# Patient Record
Sex: Female | Born: 1990 | Race: White | Hispanic: No | Marital: Married | Smoking: Never smoker
Health system: Southern US, Community
[De-identification: ages and names within clinical notes are randomized; demographics above are authoritative.]

## PROBLEM LIST (undated history)

## (undated) ENCOUNTER — Inpatient Hospital Stay: Payer: Self-pay

## (undated) DIAGNOSIS — O139 Gestational [pregnancy-induced] hypertension without significant proteinuria, unspecified trimester: Secondary | ICD-10-CM

## (undated) DIAGNOSIS — N200 Calculus of kidney: Secondary | ICD-10-CM

## (undated) DIAGNOSIS — K449 Diaphragmatic hernia without obstruction or gangrene: Secondary | ICD-10-CM

## (undated) DIAGNOSIS — T4145XA Adverse effect of unspecified anesthetic, initial encounter: Secondary | ICD-10-CM

## (undated) DIAGNOSIS — T8859XA Other complications of anesthesia, initial encounter: Secondary | ICD-10-CM

## (undated) DIAGNOSIS — R12 Heartburn: Secondary | ICD-10-CM

## (undated) DIAGNOSIS — R519 Headache, unspecified: Secondary | ICD-10-CM

## (undated) DIAGNOSIS — I10 Essential (primary) hypertension: Secondary | ICD-10-CM

## (undated) DIAGNOSIS — Z8489 Family history of other specified conditions: Secondary | ICD-10-CM

## (undated) DIAGNOSIS — F329 Major depressive disorder, single episode, unspecified: Secondary | ICD-10-CM

## (undated) DIAGNOSIS — R011 Cardiac murmur, unspecified: Secondary | ICD-10-CM

## (undated) DIAGNOSIS — O24419 Gestational diabetes mellitus in pregnancy, unspecified control: Secondary | ICD-10-CM

## (undated) DIAGNOSIS — R51 Headache: Secondary | ICD-10-CM

## (undated) DIAGNOSIS — F32A Depression, unspecified: Secondary | ICD-10-CM

## (undated) DIAGNOSIS — E041 Nontoxic single thyroid nodule: Secondary | ICD-10-CM

## (undated) DIAGNOSIS — K219 Gastro-esophageal reflux disease without esophagitis: Secondary | ICD-10-CM

## (undated) DIAGNOSIS — T753XXA Motion sickness, initial encounter: Secondary | ICD-10-CM

## (undated) DIAGNOSIS — Z87442 Personal history of urinary calculi: Secondary | ICD-10-CM

## (undated) HISTORY — DX: Essential (primary) hypertension: I10

## (undated) HISTORY — DX: Depression, unspecified: F32.A

## (undated) HISTORY — DX: Headache, unspecified: R51.9

## (undated) HISTORY — DX: Diaphragmatic hernia without obstruction or gangrene: K44.9

## (undated) HISTORY — DX: Heartburn: R12

## (undated) HISTORY — DX: Gestational diabetes mellitus in pregnancy, unspecified control: O24.419

## (undated) HISTORY — DX: Headache: R51

## (undated) HISTORY — DX: Major depressive disorder, single episode, unspecified: F32.9

## (undated) HISTORY — PX: WISDOM TOOTH EXTRACTION: SHX21

---
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
## 2010-07-15 ENCOUNTER — Ambulatory Visit: Payer: Self-pay | Admitting: Family Medicine

## 2010-11-11 NOTE — Assessment & Plan Note (Signed)
Summary: FLU SHOT/EVM    The patient and/or caregiver has been counseled thoroughly with regard to medications prescribed including dosage, schedule, interactions, rationale for use, and possible side effects and they verbalize understanding.  Diagnoses and expected course of recovery discussed and will return if not improved as expected or if the condition worsens. Patient and/or caregiver verbalized understanding.    Immunizations Administered:  Influenza Vaccine:    Vaccine Type: FLULAVAL    Site: left deltoid    Mfr: GlaxoSmithKline    Dose: 0.5 ml    Route: IM    Given by: Levonne Spiller EMT-P    Exp. Date: 03/12/2011    Lot #: [id number]    VIS given: 05/06/10 version given July 15, 2010.  Flu Vaccine Consent Questions:    Do you have a history of severe allergic reactions to this vaccine? no    Any prior history of allergic reactions to egg and/or gelatin? no    Do you have a sensitivity to the preservative Thimersol? no    Do you have a past history of Guillan-Barre Syndrome? no    Do you currently have an acute febrile illness? no    Have you ever had a severe reaction to latex? no    Vaccine information given and explained to patient? yes    Are you currently pregnant? no

## 2014-10-12 NOTE — L&D Delivery Note (Cosign Needed)
Delivery Note At  a viable female Sonya Bauer)  was delivered via  (Presentation: OA ;  ).  APGAR 8,9 ; weight  2910 gms/6lbs 8 ounces Placenta status: Intact delivered via shultz Cord: 3 vessls  with the following complications: none.    Anesthesia:  epidural Episiotomy:  none Lacerations:  superficial  Suture Repair: n/a Est. Blood Loss (mL):  350  Mom to postpartum.  Baby to Couplet care / Skin to Skin   Delivered with [name], CNM [name], SNM  [name] 07/02/2015, 5:50 AM

## 2014-11-16 DIAGNOSIS — E042 Nontoxic multinodular goiter: Secondary | ICD-10-CM | POA: Insufficient documentation

## 2014-11-16 DIAGNOSIS — E041 Nontoxic single thyroid nodule: Secondary | ICD-10-CM

## 2014-11-16 HISTORY — DX: Nontoxic single thyroid nodule: E04.1

## 2014-12-18 LAB — OB RESULTS CONSOLE HIV ANTIBODY (ROUTINE TESTING): HIV: NONREACTIVE

## 2014-12-18 LAB — OB RESULTS CONSOLE VARICELLA ZOSTER ANTIBODY, IGG: VARICELLA IGG: IMMUNE

## 2014-12-18 LAB — OB RESULTS CONSOLE RUBELLA ANTIBODY, IGM: Rubella: IMMUNE

## 2014-12-18 LAB — OB RESULTS CONSOLE HEPATITIS B SURFACE ANTIGEN: HEP B S AG: NEGATIVE

## 2014-12-31 DIAGNOSIS — Z8619 Personal history of other infectious and parasitic diseases: Secondary | ICD-10-CM | POA: Insufficient documentation

## 2014-12-31 LAB — OB RESULTS CONSOLE GC/CHLAMYDIA
Chlamydia: NEGATIVE
Gonorrhea: NEGATIVE

## 2015-01-25 DIAGNOSIS — J302 Other seasonal allergic rhinitis: Secondary | ICD-10-CM | POA: Insufficient documentation

## 2015-01-25 DIAGNOSIS — E041 Nontoxic single thyroid nodule: Secondary | ICD-10-CM | POA: Insufficient documentation

## 2015-01-25 DIAGNOSIS — G44219 Episodic tension-type headache, not intractable: Secondary | ICD-10-CM | POA: Insufficient documentation

## 2015-02-24 DIAGNOSIS — I152 Hypertension secondary to endocrine disorders: Secondary | ICD-10-CM | POA: Insufficient documentation

## 2015-03-20 ENCOUNTER — Encounter: Payer: Self-pay | Admitting: Obstetrics and Gynecology

## 2015-03-20 ENCOUNTER — Ambulatory Visit (INDEPENDENT_AMBULATORY_CARE_PROVIDER_SITE_OTHER): Payer: Medicaid Other | Admitting: Obstetrics and Gynecology

## 2015-03-20 VITALS — BP 114/92 | HR 88 | Ht 65.0 in | Wt 152.0 lb

## 2015-03-20 DIAGNOSIS — B009 Herpesviral infection, unspecified: Secondary | ICD-10-CM

## 2015-03-20 DIAGNOSIS — O26892 Other specified pregnancy related conditions, second trimester: Secondary | ICD-10-CM

## 2015-03-20 DIAGNOSIS — O162 Unspecified maternal hypertension, second trimester: Secondary | ICD-10-CM

## 2015-03-20 DIAGNOSIS — O0992 Supervision of high risk pregnancy, unspecified, second trimester: Secondary | ICD-10-CM

## 2015-03-20 DIAGNOSIS — Z3492 Encounter for supervision of normal pregnancy, unspecified, second trimester: Secondary | ICD-10-CM

## 2015-03-20 DIAGNOSIS — R12 Heartburn: Secondary | ICD-10-CM

## 2015-03-20 LAB — POCT URINALYSIS DIPSTICK
Bilirubin, UA: NEGATIVE
Blood, UA: NEGATIVE
GLUCOSE UA: NEGATIVE
KETONES UA: NEGATIVE
Leukocytes, UA: NEGATIVE
Nitrite, UA: NEGATIVE
PH UA: 6.5
PROTEIN UA: NEGATIVE
SPEC GRAV UA: 1.01
UROBILINOGEN UA: 0.2

## 2015-03-20 NOTE — Progress Notes (Signed)
Patient is transferring care, new to the area, has history of hypertension prior to pregnancy

## 2015-03-20 NOTE — Progress Notes (Signed)
Transfer OB- 24yo engaged female G1P0, moving home from [place], happy about current unplanned pregnancy; reports normal prenatal findings to date with stable BP (pre-existing HTN), normal female noted on anatomy scan- awaiting records. Instructed on enrolling in CBC,Infant care and Breastfeeding classes. [place] and HTN labs next visit.

## 2015-03-20 NOTE — Patient Instructions (Signed)
  Place 24-38 weeks prenatal visit patient instructions here.

## 2015-04-11 ENCOUNTER — Encounter: Payer: Self-pay | Admitting: *Deleted

## 2015-04-11 ENCOUNTER — Other Ambulatory Visit: Payer: Self-pay | Admitting: Obstetrics and Gynecology

## 2015-04-11 ENCOUNTER — Telehealth: Payer: Self-pay | Admitting: Obstetrics and Gynecology

## 2015-04-11 DIAGNOSIS — O26892 Other specified pregnancy related conditions, second trimester: Secondary | ICD-10-CM

## 2015-04-11 DIAGNOSIS — R12 Heartburn: Principal | ICD-10-CM

## 2015-04-11 MED ORDER — RANITIDINE HCL 150 MG PO TABS: 150.0000 mg | ORAL_TABLET | Freq: Two times a day (BID) | ORAL | Status: DC

## 2015-04-11 NOTE — Telephone Encounter (Signed)
Please let her know I sent in a prescritpion for zantac to ake 2 x a day, start tonight about 30 minutes before dinner, then one every am after taking PNV, needs to take 2 x day to prevent.

## 2015-04-11 NOTE — Telephone Encounter (Signed)
Pt has severe heart burn and wanted to know what she can take,, she is pregnant [redacted] weeks

## 2015-04-11 NOTE — Telephone Encounter (Signed)
Notified pt rx ready 

## 2015-04-11 NOTE — Telephone Encounter (Signed)
Would like something sent in, pls advise

## 2015-04-17 ENCOUNTER — Ambulatory Visit (INDEPENDENT_AMBULATORY_CARE_PROVIDER_SITE_OTHER): Payer: Medicaid Other | Admitting: Obstetrics and Gynecology

## 2015-04-17 ENCOUNTER — Encounter: Payer: Self-pay | Admitting: Obstetrics and Gynecology

## 2015-04-17 ENCOUNTER — Other Ambulatory Visit: Payer: Medicaid Other

## 2015-04-17 VITALS — BP 124/94 | HR 92 | Wt 157.2 lb

## 2015-04-17 DIAGNOSIS — Z3493 Encounter for supervision of normal pregnancy, unspecified, third trimester: Secondary | ICD-10-CM | POA: Diagnosis not present

## 2015-04-17 DIAGNOSIS — Z131 Encounter for screening for diabetes mellitus: Secondary | ICD-10-CM

## 2015-04-17 DIAGNOSIS — O10912 Unspecified pre-existing hypertension complicating pregnancy, second trimester: Secondary | ICD-10-CM

## 2015-04-17 DIAGNOSIS — I10 Essential (primary) hypertension: Secondary | ICD-10-CM

## 2015-04-17 LAB — POCT URINALYSIS DIPSTICK
Bilirubin, UA: NEGATIVE
Blood, UA: NEGATIVE
GLUCOSE UA: NEGATIVE
Ketones, UA: NEGATIVE
Leukocytes, UA: NEGATIVE
Nitrite, UA: NEGATIVE
PH UA: 7
SPEC GRAV UA: 1.01
Urobilinogen, UA: 0.2

## 2015-04-17 NOTE — Progress Notes (Signed)
ROB & Glucola- doing well w/o complaint- labs obtained; hasn't enrolled in classes yet; female infant; reviewed PIH s./s and reviewed chart with Dr Marcelline Mates. Blood consent signed & Tdap given

## 2015-04-17 NOTE — Progress Notes (Signed)
Pt denies any complaints, no headache, blurred vision

## 2015-04-18 ENCOUNTER — Other Ambulatory Visit: Payer: Self-pay | Admitting: Obstetrics and Gynecology

## 2015-04-18 DIAGNOSIS — R7309 Other abnormal glucose: Secondary | ICD-10-CM

## 2015-04-18 LAB — CBC WITH DIFFERENTIAL/PLATELET
Basophils Absolute: 0 10*3/uL (ref 0.0–0.2)
Basos: 0 %
EOS (ABSOLUTE): 0.1 10*3/uL (ref 0.0–0.4)
Eos: 2 %
Hematocrit: 34.5 % (ref 34.0–46.6)
Hemoglobin: 11.6 g/dL (ref 11.1–15.9)
Immature Grans (Abs): 0.1 10*3/uL (ref 0.0–0.1)
Immature Granulocytes: 1 %
Lymphocytes Absolute: 0.8 10*3/uL (ref 0.7–3.1)
Lymphs: 14 %
MCH: 28.7 pg (ref 26.6–33.0)
MCHC: 33.6 g/dL (ref 31.5–35.7)
MCV: 85 fL (ref 79–97)
Monocytes Absolute: 0.3 10*3/uL (ref 0.1–0.9)
Monocytes: 5 %
NEUTROS ABS: 4.7 10*3/uL (ref 1.4–7.0)
Neutrophils: 78 %
Platelets: 216 10*3/uL (ref 150–379)
RBC: 4.04 x10E6/uL (ref 3.77–5.28)
RDW: 13.2 % (ref 12.3–15.4)
WBC: 6 10*3/uL (ref 3.4–10.8)

## 2015-04-18 LAB — BASIC METABOLIC PANEL
BUN/Creatinine Ratio: 17 (ref 8–20)
BUN: 8 mg/dL (ref 6–20)
CALCIUM: 8.8 mg/dL (ref 8.7–10.2)
CO2: 18 mmol/L (ref 18–29)
Chloride: 99 mmol/L (ref 97–108)
Creatinine, Ser: 0.48 mg/dL — ABNORMAL LOW (ref 0.57–1.00)
GFR calc Af Amer: 160 mL/min/{1.73_m2} (ref >59)
GFR calc non Af Amer: 139 mL/min/{1.73_m2} (ref >59)
GLUCOSE: 162 mg/dL — AB (ref 65–99)
POTASSIUM: 3.7 mmol/L (ref 3.5–5.2)
SODIUM: 135 mmol/L (ref 134–144)

## 2015-04-18 LAB — PROTEIN / CREATININE RATIO, URINE
Creatinine, Urine: 53.7 mg/dL
PROTEIN/CREAT RATIO: 348 mg/g{creat} — AB (ref 0–200)
Protein, Ur: 18.7 mg/dL

## 2015-04-18 LAB — URIC ACID: Uric Acid: 3.6 mg/dL (ref 2.5–7.1)

## 2015-04-18 LAB — TSH: TSH: 1.18 u[IU]/mL (ref 0.450–4.500)

## 2015-04-18 LAB — GLUCOSE, 1 HOUR GESTATIONAL: Gestational Diabetes Screen: 164 mg/dL — ABNORMAL HIGH (ref 65–139)

## 2015-04-22 ENCOUNTER — Telehealth: Payer: Self-pay | Admitting: *Deleted

## 2015-04-22 NOTE — Telephone Encounter (Signed)
Notified pt of abnormal lab results, advised pt to be mindful of her carbs and sweet, pt will be in on 7/156/2016 for 3 hr gTT

## 2015-04-22 NOTE — Telephone Encounter (Signed)
-----   Message from Sonya Bauer, [place] sent at 04/18/2015  9:21 AM EDT ----- Please let her know she failed her glucola and needs to come in within a week for the 3 hr GTT, all other labs normal

## 2015-04-26 ENCOUNTER — Other Ambulatory Visit: Payer: Medicaid Other

## 2015-04-26 ENCOUNTER — Other Ambulatory Visit: Payer: Self-pay | Admitting: Obstetrics and Gynecology

## 2015-04-26 DIAGNOSIS — R7309 Other abnormal glucose: Secondary | ICD-10-CM

## 2015-04-30 ENCOUNTER — Telehealth: Payer: Self-pay | Admitting: *Deleted

## 2015-04-30 LAB — GESTATIONAL GLUCOSE TOLERANCE
GLUCOSE 2 HOUR GTT: 116 mg/dL (ref 65–154)
Glucose, Fasting: 79 mg/dL (ref 65–94)
Glucose, GTT - 1 Hour: 192 mg/dL — ABNORMAL HIGH (ref 65–179)
Glucose, GTT - 3 Hour: 89 mg/dL (ref 65–139)

## 2015-04-30 LAB — SPECIMEN STATUS REPORT

## 2015-04-30 NOTE — Telephone Encounter (Signed)
Notified pt of lab results and she voiced understanding

## 2015-04-30 NOTE — Telephone Encounter (Signed)
-----   Message from Evonnie Pat, [place] sent at 04/30/2015  1:43 PM EDT ----- Please let her know she passed 3hGTT, but 1 hr is really high- needs to limit carbs and simple sugar intake as her blood levels take longer to come down to normal. Baby gets all that extra sugar and can be bigger at birth.

## 2015-05-07 ENCOUNTER — Encounter: Payer: Medicaid Other | Admitting: Obstetrics and Gynecology

## 2015-05-08 ENCOUNTER — Encounter: Payer: Self-pay | Admitting: Obstetrics and Gynecology

## 2015-05-08 ENCOUNTER — Ambulatory Visit (INDEPENDENT_AMBULATORY_CARE_PROVIDER_SITE_OTHER): Payer: Medicaid Other | Admitting: Obstetrics and Gynecology

## 2015-05-08 VITALS — BP 111/75 | HR 86 | Wt 162.2 lb

## 2015-05-08 DIAGNOSIS — Z3493 Encounter for supervision of normal pregnancy, unspecified, third trimester: Secondary | ICD-10-CM

## 2015-05-08 DIAGNOSIS — A6000 Herpesviral infection of urogenital system, unspecified: Secondary | ICD-10-CM

## 2015-05-08 DIAGNOSIS — R030 Elevated blood-pressure reading, without diagnosis of hypertension: Secondary | ICD-10-CM

## 2015-05-08 DIAGNOSIS — IMO0001 Reserved for inherently not codable concepts without codable children: Secondary | ICD-10-CM

## 2015-05-08 LAB — POCT URINALYSIS DIPSTICK
BILIRUBIN UA: NEGATIVE
Blood, UA: NEGATIVE
GLUCOSE UA: NEGATIVE
Ketones, UA: NEGATIVE
LEUKOCYTES UA: NEGATIVE
NITRITE UA: NEGATIVE
PH UA: 7
Spec Grav, UA: 1.01
Urobilinogen, UA: 0.2

## 2015-05-08 NOTE — Progress Notes (Signed)
ROB- to add colace daily, and increase fiber in diet, increase water intake.  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

## 2015-05-08 NOTE — Progress Notes (Signed)
Pt is here c/p SOB, constipation

## 2015-05-10 ENCOUNTER — Encounter: Payer: Medicaid Other | Admitting: Obstetrics and Gynecology

## 2015-05-23 ENCOUNTER — Encounter: Payer: Medicaid Other | Admitting: Obstetrics and Gynecology

## 2015-05-24 ENCOUNTER — Ambulatory Visit (INDEPENDENT_AMBULATORY_CARE_PROVIDER_SITE_OTHER): Payer: Medicaid Other | Admitting: Obstetrics and Gynecology

## 2015-05-24 ENCOUNTER — Encounter: Payer: Self-pay | Admitting: Obstetrics and Gynecology

## 2015-05-24 ENCOUNTER — Encounter: Payer: Medicaid Other | Admitting: Obstetrics and Gynecology

## 2015-05-24 VITALS — BP 122/86 | HR 88 | Wt 163.4 lb

## 2015-05-24 DIAGNOSIS — Z3493 Encounter for supervision of normal pregnancy, unspecified, third trimester: Secondary | ICD-10-CM

## 2015-05-24 LAB — POCT URINALYSIS DIPSTICK
BILIRUBIN UA: NEGATIVE
GLUCOSE UA: NEGATIVE
KETONES UA: NEGATIVE
LEUKOCYTES UA: NEGATIVE
NITRITE UA: NEGATIVE
RBC UA: NEGATIVE
Spec Grav, UA: 1.01
Urobilinogen, UA: 0.2
pH, UA: 7

## 2015-05-24 MED ORDER — PANTOPRAZOLE SODIUM 40 MG PO TBEC: 40.0000 mg | DELAYED_RELEASE_TABLET | Freq: Every day | ORAL | Status: DC

## 2015-05-24 NOTE — Progress Notes (Signed)
ROB-heartburn has gotten worse, pt is having some morning sickness

## 2015-05-24 NOTE — Progress Notes (Signed)
ROB- changed to protonix rx sent in, interested in Cord blood donation- info given

## 2015-06-07 ENCOUNTER — Ambulatory Visit (INDEPENDENT_AMBULATORY_CARE_PROVIDER_SITE_OTHER): Payer: Medicaid Other | Admitting: Obstetrics and Gynecology

## 2015-06-07 ENCOUNTER — Encounter: Payer: Self-pay | Admitting: Obstetrics and Gynecology

## 2015-06-07 VITALS — BP 124/83 | HR 87 | Wt 171.0 lb

## 2015-06-07 DIAGNOSIS — Z331 Pregnant state, incidental: Secondary | ICD-10-CM

## 2015-06-07 LAB — POCT URINALYSIS DIPSTICK
Bilirubin, UA: NEGATIVE
Blood, UA: NEGATIVE
GLUCOSE UA: NEGATIVE
Ketones, UA: NEGATIVE
Leukocytes, UA: NEGATIVE
Nitrite, UA: NEGATIVE
SPEC GRAV UA: 1.015
Urobilinogen, UA: 0.2
pH, UA: 6.5

## 2015-06-07 NOTE — Progress Notes (Signed)
ROB- doing well, to increase water and watch sodium intake

## 2015-06-07 NOTE — Patient Instructions (Signed)

## 2015-06-07 NOTE — Progress Notes (Signed)
ROB-pt is having some increased swelling, concerned about her weight gain

## 2015-06-12 ENCOUNTER — Observation Stay
Admission: EM | Admit: 2015-06-12 | Discharge: 2015-06-13 | Disposition: A | Discharge status: Home / Self Care | Payer: Medicaid Other | Attending: Obstetrics and Gynecology | Admitting: Obstetrics and Gynecology

## 2015-06-12 DIAGNOSIS — Z3A36 36 weeks gestation of pregnancy: Secondary | ICD-10-CM | POA: Insufficient documentation

## 2015-06-12 DIAGNOSIS — O26893 Other specified pregnancy related conditions, third trimester: Principal | ICD-10-CM | POA: Insufficient documentation

## 2015-06-12 DIAGNOSIS — R102 Pelvic and perineal pain: Secondary | ICD-10-CM | POA: Insufficient documentation

## 2015-06-12 DIAGNOSIS — A6000 Herpesviral infection of urogenital system, unspecified: Secondary | ICD-10-CM | POA: Insufficient documentation

## 2015-06-12 DIAGNOSIS — O0993 Supervision of high risk pregnancy, unspecified, third trimester: Secondary | ICD-10-CM

## 2015-06-12 DIAGNOSIS — R1030 Lower abdominal pain, unspecified: Secondary | ICD-10-CM | POA: Insufficient documentation

## 2015-06-12 DIAGNOSIS — O98313 Other infections with a predominantly sexual mode of transmission complicating pregnancy, third trimester: Secondary | ICD-10-CM | POA: Insufficient documentation

## 2015-06-13 DIAGNOSIS — O0993 Supervision of high risk pregnancy, unspecified, third trimester: Secondary | ICD-10-CM

## 2015-06-13 DIAGNOSIS — R102 Pelvic and perineal pain: Secondary | ICD-10-CM | POA: Diagnosis not present

## 2015-06-13 DIAGNOSIS — R1030 Lower abdominal pain, unspecified: Secondary | ICD-10-CM | POA: Diagnosis not present

## 2015-06-13 DIAGNOSIS — O98313 Other infections with a predominantly sexual mode of transmission complicating pregnancy, third trimester: Secondary | ICD-10-CM | POA: Diagnosis not present

## 2015-06-13 DIAGNOSIS — A6000 Herpesviral infection of urogenital system, unspecified: Secondary | ICD-10-CM | POA: Diagnosis not present

## 2015-06-13 DIAGNOSIS — O26893 Other specified pregnancy related conditions, third trimester: Secondary | ICD-10-CM | POA: Diagnosis present

## 2015-06-13 DIAGNOSIS — O133 Gestational [pregnancy-induced] hypertension without significant proteinuria, third trimester: Secondary | ICD-10-CM

## 2015-06-13 DIAGNOSIS — Z3A36 36 weeks gestation of pregnancy: Secondary | ICD-10-CM | POA: Diagnosis not present

## 2015-06-13 LAB — URINALYSIS COMPLETE WITH MICROSCOPIC (ARMC ONLY)
Bilirubin Urine: NEGATIVE
Glucose, UA: NEGATIVE mg/dL
Hgb urine dipstick: NEGATIVE
Ketones, ur: NEGATIVE mg/dL
Leukocytes, UA: NEGATIVE
Nitrite: NEGATIVE
PH: 6 (ref 5.0–8.0)
Protein, ur: NEGATIVE mg/dL
Specific Gravity, Urine: 1.018 (ref 1.005–1.030)

## 2015-06-13 NOTE — OB Triage Note (Signed)
C/o lower abdominal pain. Points to round ligament.   Denies ROM, VB.   +FM   Started around `9741.   Has not taken anything for this pain.  Last BM 06/12/2015 2220  (X3)

## 2015-06-13 NOTE — Progress Notes (Signed)
Report called to M. Trudee Kuster, CNM. Order received.

## 2015-06-13 NOTE — Progress Notes (Signed)
Pt given d/c instructions, verbalized understanding.  Will keep next appt.   D/C ambulatory in stable condition home with husband.

## 2015-06-16 NOTE — Discharge Summary (Signed)
NST INTERPRETATION:  Indications: Abdominal pain  Mode: External Baseline Rate (A): 140 bpm Variability: Moderate Accelerations: 15 x 15 Decelerations: None     Contraction Frequency (min): 0   Impression:  1.reactive NST at [redacted] weeks gestation 2.  No contractions   Plan:  1.  Urine culture. 2.  Keep regular OB appointment as scheduled. 3.  Labor precautions    Brayton Mars, MD

## 2015-06-20 ENCOUNTER — Encounter: Payer: Medicaid Other | Admitting: Obstetrics and Gynecology

## 2015-06-21 ENCOUNTER — Encounter: Payer: Self-pay | Admitting: Obstetrics and Gynecology

## 2015-06-21 ENCOUNTER — Ambulatory Visit (INDEPENDENT_AMBULATORY_CARE_PROVIDER_SITE_OTHER): Payer: Medicaid Other | Admitting: Obstetrics and Gynecology

## 2015-06-21 VITALS — BP 138/98 | HR 88 | Wt 173.1 lb

## 2015-06-21 DIAGNOSIS — Z3685 Encounter for antenatal screening for Streptococcus B: Secondary | ICD-10-CM

## 2015-06-21 DIAGNOSIS — Z113 Encounter for screening for infections with a predominantly sexual mode of transmission: Secondary | ICD-10-CM

## 2015-06-21 DIAGNOSIS — Z3493 Encounter for supervision of normal pregnancy, unspecified, third trimester: Secondary | ICD-10-CM

## 2015-06-21 DIAGNOSIS — O133 Gestational [pregnancy-induced] hypertension without significant proteinuria, third trimester: Secondary | ICD-10-CM

## 2015-06-21 DIAGNOSIS — O163 Unspecified maternal hypertension, third trimester: Secondary | ICD-10-CM

## 2015-06-21 DIAGNOSIS — Z36 Encounter for antenatal screening of mother: Secondary | ICD-10-CM

## 2015-06-21 LAB — POCT URINALYSIS DIPSTICK
Blood, UA: NEGATIVE
Glucose, UA: NEGATIVE
KETONES UA: NEGATIVE
Leukocytes, UA: NEGATIVE
Nitrite, UA: NEGATIVE
PH UA: 6
SPEC GRAV UA: 1.02
Urobilinogen, UA: 0.2

## 2015-06-21 MED ORDER — INFLUENZA VAC SPLIT QUAD 0.5 ML IM SUSY
0.5000 mL | PREFILLED_SYRINGE | Freq: Once | INTRAMUSCULAR | Status: AC
  Administered 2015-06-21: 0.5 mL via INTRAMUSCULAR

## 2015-06-21 NOTE — Patient Instructions (Signed)

## 2015-06-21 NOTE — Progress Notes (Signed)
ROB & cultures- discussed s/s of PIH and placed out of work until labs return

## 2015-06-21 NOTE — Progress Notes (Signed)
ROB-denies any new complaints, cultures obtained today, flu given  Some increased swelling

## 2015-06-22 LAB — BASIC METABOLIC PANEL (7)
BUN / CREAT RATIO: 19 (ref 8–20)
BUN: 9 mg/dL (ref 6–20)
CHLORIDE: 101 mmol/L (ref 97–108)
CO2: 16 mmol/L — AB (ref 18–29)
CREATININE: 0.47 mg/dL — AB (ref 0.57–1.00)
GFR calc non Af Amer: 140 mL/min/{1.73_m2} (ref >59)
GFR, EST AFRICAN AMERICAN: 161 mL/min/{1.73_m2} (ref >59)
Glucose: 86 mg/dL (ref 65–99)
Potassium: 4.2 mmol/L (ref 3.5–5.2)
Sodium: 138 mmol/L (ref 134–144)

## 2015-06-22 LAB — PROTEIN / CREATININE RATIO, URINE
CREATININE, UR: 125.2 mg/dL
PROTEIN UR: 22.3 mg/dL
Protein/Creat Ratio: 178 mg/g creat (ref 0–200)

## 2015-06-22 LAB — ALT: ALT: 11 IU/L (ref 0–32)

## 2015-06-22 LAB — PLATELET COUNT: PLATELETS: 220 10*3/uL (ref 150–379)

## 2015-06-22 LAB — AST: AST: 10 IU/L (ref 0–40)

## 2015-06-22 LAB — URIC ACID: URIC ACID: 4.1 mg/dL (ref 2.5–7.1)

## 2015-06-23 LAB — STREP GP B NAA: STREP GROUP B AG: NEGATIVE

## 2015-06-24 LAB — GC/CHLAMYDIA PROBE AMP
CHLAMYDIA, DNA PROBE: NEGATIVE
NEISSERIA GONORRHOEAE BY PCR: NEGATIVE

## 2015-06-25 ENCOUNTER — Ambulatory Visit (INDEPENDENT_AMBULATORY_CARE_PROVIDER_SITE_OTHER): Payer: Medicaid Other | Admitting: Obstetrics and Gynecology

## 2015-06-25 ENCOUNTER — Encounter: Payer: Self-pay | Admitting: Obstetrics and Gynecology

## 2015-06-25 VITALS — BP 134/91 | HR 89 | Wt 171.9 lb

## 2015-06-25 DIAGNOSIS — Z3493 Encounter for supervision of normal pregnancy, unspecified, third trimester: Secondary | ICD-10-CM

## 2015-06-25 LAB — POCT URINALYSIS DIPSTICK
Blood, UA: NEGATIVE
Ketones, UA: NEGATIVE
Leukocytes, UA: NEGATIVE
NITRITE UA: NEGATIVE
Spec Grav, UA: 1.01
UROBILINOGEN UA: 0.2
pH, UA: 6.5

## 2015-06-25 NOTE — Progress Notes (Signed)
Subjective:   Sonya Bauer is a 24 y.o. G1P0 [redacted]w[redacted]d being seen today for her obstetrical visit.  Patient reports fatigue, no bleeding, no contractions, no cramping, no leaking and elevated BP readings at home of 130s/80-90s, one reading with diastolic 109.  Denies contractions, vaginal bleeding or leaking of fluid.  Reports good fetal movement.  The following portions of the patient's history were reviewed and updated as appropriate: allergies, current medications, past family history, past medical history, past social history, past surgical history and problem list.   Objective:  BP 134/91 mmHg  Pulse 89  Wt 171 lb 14.4 oz (77.973 kg)  LMP 10/07/2014 (LMP Unknown)  FHT:    Uterine Size:    Fetal Movement:    Presentation:      Abdomen:  soft, gravid, appropriate for gestational age,non-tender         Results for orders placed or performed in visit on 06/25/15 (from the past 24 hour(s))  POCT urinalysis dipstick     Status: None   Collection Time: 06/25/15 10:33 AM  Result Value Ref Range   Color, UA yellow    Clarity, UA cloudy    Glucose, UA 2+    Bilirubin, UA small    Ketones, UA neg    Spec Grav, UA 1.010    Blood, UA neg    pH, UA 6.5    Protein, UA trace    Urobilinogen, UA 0.2    Nitrite, UA neg    Leukocytes, UA Negative Negative    Assessment and Plan:   Pregnancy:  G1P0 at [redacted]w[redacted]d  1. Prenatal care, third trimester NST performed today was reviewed and was found to be reactive. Baseline145 with Moderate variability; No decels noted.  Continue recommended antenatal testing and prenatal care. - POCT urinalysis dipstick   Follow up in 3 days for U/S for AFI & EFW , NST, BP evelauation  At home on modified bedrest until delivery of infant.  Melody Valene Bors, CNM

## 2015-06-25 NOTE — Patient Instructions (Signed)

## 2015-06-25 NOTE — Progress Notes (Signed)
ROB- some increased blood pressure, feels very "dazy", not sleeping well Low back pain

## 2015-06-26 ENCOUNTER — Encounter: Payer: Medicaid Other | Admitting: Obstetrics and Gynecology

## 2015-06-28 ENCOUNTER — Ambulatory Visit (INDEPENDENT_AMBULATORY_CARE_PROVIDER_SITE_OTHER): Payer: Medicaid Other | Admitting: Obstetrics and Gynecology

## 2015-06-28 ENCOUNTER — Ambulatory Visit: Payer: Medicaid Other

## 2015-06-28 VITALS — BP 133/88 | HR 79 | Wt 172.4 lb

## 2015-06-28 DIAGNOSIS — Z3493 Encounter for supervision of normal pregnancy, unspecified, third trimester: Secondary | ICD-10-CM

## 2015-06-28 DIAGNOSIS — Z331 Pregnant state, incidental: Secondary | ICD-10-CM | POA: Diagnosis not present

## 2015-06-28 DIAGNOSIS — Z1389 Encounter for screening for other disorder: Secondary | ICD-10-CM

## 2015-06-28 LAB — POCT URINALYSIS DIPSTICK
Bilirubin, UA: NEGATIVE
Blood, UA: NEGATIVE
Glucose, UA: NEGATIVE
Ketones, UA: NEGATIVE
Leukocytes, UA: NEGATIVE
Nitrite, UA: NEGATIVE
PH UA: 8
PROTEIN UA: NEGATIVE
SPEC GRAV UA: 1.01
UROBILINOGEN UA: NEGATIVE

## 2015-06-28 NOTE — Progress Notes (Signed)
ULTRASOUND REPORT  Location: [facility] Date of Service: 0-16-16  Indications:AFI/EFW Findings:  Sonya Bauer intrauterine pregnancy is visualized with FHR at 137 BPM. Biometrics give an (U/S) Gestational age of 42 6/7 weeks and an (U/S) EDD of 07-20-15; this correlates with the clinically established EDD of 07-14-15.  Fetal presentation is Vertex. Spine on maternal right. EFW: 3113 grams; 6 lbs and 14 oz; 44%.. Placenta: Anterior; areas of grade 2 and 3. AFI: 13.2 cm.    Impression: 1. 36 6/7 week Viable Singleton Intrauterine pregnancy by U/S. 2. (U/S) EDD is consistent with Clinically established (LMP) EDD of 07-20-15. 3. EFW = 6 lbs 14 oz 4. AFI = 13.2 cm.  Recommendations: 1.Clinical correlation with the patient's History and Physical Exam.   Moody,Donna, Rad Tech  Scan reviewed and agree with findings. Counseled patient at today's visit.  [name], CNM           Last Resulted: 06/28/15 12:01 PM

## 2015-06-28 NOTE — Progress Notes (Signed)
NONSTRESS TEST INTERPRETATION  INDICATIONS: Elevated Blood Pressure  FHR baseline: 140 RESULTS: reactive COMMENTS:   B/P Readings while on NST:  B/P 125/86, P-72; B/P 134/88, P-88.   PLAN: 1. Continue fetal kick counts twice a day. 2. Continue antepartum testing as scheduled-Biweekly 3.  Martie Lee, LPN

## 2015-07-01 ENCOUNTER — Ambulatory Visit (INDEPENDENT_AMBULATORY_CARE_PROVIDER_SITE_OTHER): Payer: Medicaid Other | Admitting: Obstetrics and Gynecology

## 2015-07-01 ENCOUNTER — Inpatient Hospital Stay
Admission: EM | Admit: 2015-07-01 | Discharge: 2015-07-04 | DRG: 774 | Disposition: A | Discharge status: Home / Self Care | Payer: Medicaid Other | Attending: Obstetrics and Gynecology | Admitting: Obstetrics and Gynecology

## 2015-07-01 ENCOUNTER — Encounter: Payer: Self-pay | Admitting: Obstetrics and Gynecology

## 2015-07-01 VITALS — BP 147/97 | Wt 174.3 lb

## 2015-07-01 DIAGNOSIS — Z3403 Encounter for supervision of normal first pregnancy, third trimester: Secondary | ICD-10-CM | POA: Diagnosis not present

## 2015-07-01 DIAGNOSIS — E041 Nontoxic single thyroid nodule: Secondary | ICD-10-CM | POA: Diagnosis present

## 2015-07-01 DIAGNOSIS — Z3493 Encounter for supervision of normal pregnancy, unspecified, third trimester: Secondary | ICD-10-CM | POA: Diagnosis not present

## 2015-07-01 DIAGNOSIS — R51 Headache: Secondary | ICD-10-CM | POA: Diagnosis present

## 2015-07-01 DIAGNOSIS — O1493 Unspecified pre-eclampsia, third trimester: Principal | ICD-10-CM | POA: Diagnosis present

## 2015-07-01 DIAGNOSIS — O1490 Unspecified pre-eclampsia, unspecified trimester: Secondary | ICD-10-CM | POA: Diagnosis present

## 2015-07-01 DIAGNOSIS — O1092 Unspecified pre-existing hypertension complicating childbirth: Secondary | ICD-10-CM | POA: Diagnosis present

## 2015-07-01 DIAGNOSIS — Z3A38 38 weeks gestation of pregnancy: Secondary | ICD-10-CM | POA: Diagnosis present

## 2015-07-01 HISTORY — DX: Gestational (pregnancy-induced) hypertension without significant proteinuria, unspecified trimester: O13.9

## 2015-07-01 HISTORY — DX: Nontoxic single thyroid nodule: E04.1

## 2015-07-01 LAB — CBC WITH DIFFERENTIAL/PLATELET
Basophils Absolute: 0 10*3/uL (ref 0–0.1)
Basophils Relative: 0 %
EOS PCT: 1 %
Eosinophils Absolute: 0.1 10*3/uL (ref 0–0.7)
HEMATOCRIT: 38.1 % (ref 35.0–47.0)
Hemoglobin: 13.2 g/dL (ref 12.0–16.0)
LYMPHS ABS: 1.5 10*3/uL (ref 1.0–3.6)
LYMPHS PCT: 17 %
MCH: 29.5 pg (ref 26.0–34.0)
MCHC: 34.7 g/dL (ref 32.0–36.0)
MCV: 85 fL (ref 80.0–100.0)
MONO ABS: 0.5 10*3/uL (ref 0.2–0.9)
Monocytes Relative: 6 %
NEUTROS ABS: 6.5 10*3/uL (ref 1.4–6.5)
Neutrophils Relative %: 76 %
PLATELETS: 227 10*3/uL (ref 150–440)
RBC: 4.48 MIL/uL (ref 3.80–5.20)
RDW: 13.8 % (ref 11.5–14.5)
WBC: 8.6 10*3/uL (ref 3.6–11.0)

## 2015-07-01 LAB — TYPE AND SCREEN
ABO/RH(D): O POS
ANTIBODY SCREEN: NEGATIVE

## 2015-07-01 LAB — POCT URINALYSIS DIPSTICK
BILIRUBIN UA: NEGATIVE
GLUCOSE UA: NEGATIVE
Ketones, UA: NEGATIVE
LEUKOCYTES UA: NEGATIVE
NITRITE UA: NEGATIVE
RBC UA: NEGATIVE
Spec Grav, UA: 1.01
Urobilinogen, UA: 0.2
pH, UA: 7.5

## 2015-07-01 LAB — COMPREHENSIVE METABOLIC PANEL
ALBUMIN: 3.3 g/dL — AB (ref 3.5–5.0)
ALK PHOS: 141 U/L — AB (ref 38–126)
ALT: 15 U/L (ref 14–54)
AST: 24 U/L (ref 15–41)
Anion gap: 7 (ref 5–15)
BILIRUBIN TOTAL: 0.7 mg/dL (ref 0.3–1.2)
BUN: 10 mg/dL (ref 6–20)
CALCIUM: 9.3 mg/dL (ref 8.9–10.3)
CO2: 23 mmol/L (ref 22–32)
CREATININE: 0.47 mg/dL (ref 0.44–1.00)
Chloride: 105 mmol/L (ref 101–111)
GFR calc Af Amer: >60 mL/min (ref >60)
GFR calc non Af Amer: >60 mL/min (ref >60)
GLUCOSE: 62 mg/dL — AB (ref 65–99)
Potassium: 3.8 mmol/L (ref 3.5–5.1)
Sodium: 135 mmol/L (ref 135–145)
TOTAL PROTEIN: 6.9 g/dL (ref 6.5–8.1)

## 2015-07-01 LAB — CHLAMYDIA/NGC RT PCR (ARMC ONLY)
Chlamydia Tr: NOT DETECTED
N gonorrhoeae: NOT DETECTED

## 2015-07-01 LAB — PROTEIN / CREATININE RATIO, URINE
Creatinine, Urine: 97 mg/dL
Protein Creatinine Ratio: 0.13 mg/mg{Cre} (ref 0.00–0.15)
Total Protein, Urine: 13 mg/dL

## 2015-07-01 MED ORDER — HYDRALAZINE HCL 20 MG/ML IJ SOLN
10.0000 mg | Freq: Once | INTRAMUSCULAR | Status: AC | PRN
  Administered 2015-07-02: 10 mg via INTRAVENOUS
  Filled 2015-07-01: qty 1

## 2015-07-01 MED ORDER — MISOPROSTOL 25 MCG QUARTER TABLET
50.0000 ug | ORAL_TABLET | ORAL | Status: DC
  Administered 2015-07-01 (×2): 50 ug via VAGINAL
  Filled 2015-07-01: qty 1
  Filled 2015-07-01 (×2): qty 0.5

## 2015-07-01 MED ORDER — ACETAMINOPHEN 325 MG PO TABS
650.0000 mg | ORAL_TABLET | ORAL | Status: DC | PRN
  Administered 2015-07-01 – 2015-07-02 (×2): 650 mg via ORAL
  Filled 2015-07-01 (×2): qty 2

## 2015-07-01 MED ORDER — MAGNESIUM SULFATE BOLUS VIA INFUSION
4.0000 g | Freq: Once | INTRAVENOUS | Status: AC
  Administered 2015-07-01: 4 g via INTRAVENOUS
  Filled 2015-07-01: qty 500

## 2015-07-01 MED ORDER — LABETALOL HCL 5 MG/ML IV SOLN
20.0000 mg | INTRAVENOUS | Status: DC | PRN
Start: 2015-07-01 — End: 2015-07-03

## 2015-07-01 MED ORDER — LACTATED RINGERS IV SOLN
INTRAVENOUS | Status: DC
  Administered 2015-07-01: 100 mL via INTRAVENOUS
  Administered 2015-07-01 – 2015-07-02 (×2): via INTRAVENOUS

## 2015-07-01 MED ORDER — ONDANSETRON HCL 4 MG/2ML IJ SOLN: 4.0000 mg | Freq: Four times a day (QID) | INTRAMUSCULAR | Status: DC | PRN

## 2015-07-01 MED ORDER — CALCIUM GLUCONATE 10 % IV SOLN
INTRAVENOUS | Status: AC
  Filled 2015-07-01: qty 10

## 2015-07-01 MED ORDER — OXYTOCIN 40 UNITS IN LACTATED RINGERS INFUSION - SIMPLE MED
62.5000 mL/h | INTRAVENOUS | Status: DC
  Administered 2015-07-02: 62.5 mL/h via INTRAVENOUS
  Filled 2015-07-01: qty 1000

## 2015-07-01 MED ORDER — LACTATED RINGERS IV SOLN: 500.0000 mL | INTRAVENOUS | Status: DC | PRN

## 2015-07-01 MED ORDER — MAGNESIUM SULFATE 50 % IJ SOLN
2.0000 g/h | INTRAMUSCULAR | Status: DC
  Administered 2015-07-01 – 2015-07-02 (×2): 2 g/h via INTRAVENOUS
  Filled 2015-07-01 (×2): qty 80

## 2015-07-01 MED ORDER — OXYTOCIN BOLUS FROM INFUSION
500.0000 mL | INTRAVENOUS | Status: DC
  Administered 2015-07-02: 06:00:00 via INTRAVENOUS
  Administered 2015-07-02: 500 mL via INTRAVENOUS

## 2015-07-01 MED ORDER — BUTORPHANOL TARTRATE 1 MG/ML IJ SOLN: 1.0000 mg | INTRAMUSCULAR | Status: DC | PRN

## 2015-07-01 NOTE — H&P (Signed)
Sonya Bauer is a 24 y.o. female presenting for IOL at 38w 1 d secondary to pre-eclampsia. Maternal Medical History:  Reason for admission: Nausea.   Contractions: Onset was 3-5 hours ago.   Frequency: irregular.   Duration is approximately 60 seconds.   Perceived severity is mild.    Fetal activity: Perceived fetal activity is normal.    Prenatal complications: Pre-eclampsia.   No bleeding.   Prenatal Complications - Diabetes: none.    OB History    Gravida Para Term Preterm AB TAB SAB Ectopic Multiple Living   1              Past Medical History  Diagnosis Date  . Headache   . Hypertension    History reviewed. No pertinent past surgical history. Family History: family history includes Hypertension in her father. Social History:  reports that she has never smoked. She has never used smokeless tobacco. She reports that she does not drink alcohol or use illicit drugs.   Prenatal Transfer Tool  Maternal Diabetes: No Genetic Screening: Normal Maternal Ultrasounds/Referrals: Normal Fetal Ultrasounds or other Referrals:  None Maternal Substance Abuse:  No Significant Maternal Medications:  None Significant Maternal Lab Results:  Lab values include: Other:  Other Comments:  None  Review of Systems  Eyes: Negative for blurred vision.  Respiratory: Negative for shortness of breath.   Cardiovascular: Negative for chest pain.  Gastrointestinal: Positive for nausea, vomiting and diarrhea.  Genitourinary: Positive for frequency.  Musculoskeletal: Positive for back pain.  Neurological: Negative for headaches.    Dilation: 1 Effacement (%): 50 Station: -2 Blood pressure 147/97, weight 174 lb 4.8 oz (79.062 kg), last menstrual period 10/07/2014. Maternal Exam:  Uterine Assessment: Contraction strength is mild.  Contraction duration is 60 seconds. Contraction frequency is irregular.   Abdomen: Patient reports no abdominal tenderness. Fundal height is 37.   Estimated  fetal weight is 7#.   Fetal presentation: vertex  Introitus: Vulva is negative for lesion.  Normal vagina.  Ferning test: not done.  Nitrazine test: not done.  Pelvis: questionable for delivery.   Cervix: Cervix evaluated by digital exam.     Physical Exam  Constitutional: She appears well-developed and well-nourished.  Genitourinary: Vulva exhibits no lesion.  Musculoskeletal: She exhibits edema.  Neurological: She is alert. She has normal reflexes.   Cervix 1/50/-2 firm Prenatal labs: ABO, Rh:   Antibody:  neg Rubella:  Immune RPR:   NR HBsAg:   neg HIV:   neg GBS:   neg  Assessment/Plan: Pre-eclampsia at [redacted]w[redacted]d  Sent to L&D for IOL and mag sulfate administration, will plan cytotec induction  Hopeful for NSVD   Melody Burr 07/01/2015, 12:02 PM

## 2015-07-01 NOTE — Progress Notes (Signed)
Sonya Bauer is a 24 y.o. G1P0 at [redacted]w[redacted]d by LMP admitted for induction of labor due to Pre-eclamptic toxemia of pregnancy..  Subjective:   Objective: BP 157/102 mmHg  Pulse 67  Temp(Src) 98.3 F (36.8 C) (Oral)  Resp 16  Ht 5\' 5"  (1.651 m)  Wt 78.926 kg (174 lb)  BMI 28.96 kg/m2  SpO2 100%  LMP 10/07/2014 (LMP Unknown)   Total I/O In: 512 [I.V.:512] Out: 925 [Urine:925]  FHT:  FHR: 145 bpm, variability: moderate,  accelerations:  Present,  decelerations:  Absent UC:   irregular, every 4 minutes, mild to palpation- painful to patient in her back SVE:    1/50/-3 in office  Labs: Lab Results  Component Value Date   WBC 8.6 07/01/2015   HGB 13.2 07/01/2015   HCT 38.1 07/01/2015   MCV 85.0 07/01/2015   PLT 227 07/01/2015    Assessment / Plan: Induction of labor due to preeclampsia,  progressing well on pitocin  Labor: Cytotec IOL started Preeclampsia:  on magnesium sulfate, intake and ouput balanced and labs stable Fetal Wellbeing:  Category I Pain Control:  Labor support without medications I/D:  n/a Anticipated MOD:  NSVD  Melody Burr 07/01/2015, 5:26 PM

## 2015-07-01 NOTE — Progress Notes (Signed)
ROB- work in called she has been having low back pain all night, elevated BP

## 2015-07-01 NOTE — Progress Notes (Signed)
Problem OB-called with increase BP and nausea/vomiting with diarrhea this am, BPs here 147/97, 146/94 and 134/94- to L&D for IOL

## 2015-07-02 ENCOUNTER — Inpatient Hospital Stay: Payer: Medicaid Other | Admitting: Anesthesiology

## 2015-07-02 ENCOUNTER — Encounter: Payer: Self-pay | Admitting: Anesthesiology

## 2015-07-02 ENCOUNTER — Encounter: Payer: Medicaid Other | Admitting: Obstetrics and Gynecology

## 2015-07-02 DIAGNOSIS — Z3403 Encounter for supervision of normal first pregnancy, third trimester: Secondary | ICD-10-CM

## 2015-07-02 LAB — CBC WITH DIFFERENTIAL/PLATELET
BASOS PCT: 1 %
Basophils Absolute: 0.1 10*3/uL (ref 0–0.1)
Basophils Absolute: 0 10*3/uL (ref 0–0.1)
Basophils Relative: 0 %
EOS ABS: 0.1 10*3/uL (ref 0–0.7)
EOS ABS: 0 10*3/uL (ref 0–0.7)
EOS PCT: 1 %
EOS PCT: 0 %
HCT: 37.4 % (ref 35.0–47.0)
HCT: 36.6 % (ref 35.0–47.0)
Hemoglobin: 12.8 g/dL (ref 12.0–16.0)
Hemoglobin: 12.6 g/dL (ref 12.0–16.0)
LYMPHS ABS: 0.7 10*3/uL — AB (ref 1.0–3.6)
LYMPHS PCT: 5 %
Lymphocytes Relative: 13 %
Lymphs Abs: 1.5 10*3/uL (ref 1.0–3.6)
MCH: 28.8 pg (ref 26.0–34.0)
MCH: 29.2 pg (ref 26.0–34.0)
MCHC: 34.1 g/dL (ref 32.0–36.0)
MCHC: 34.3 g/dL (ref 32.0–36.0)
MCV: 84.6 fL (ref 80.0–100.0)
MCV: 85.1 fL (ref 80.0–100.0)
MONO ABS: 0.6 10*3/uL (ref 0.2–0.9)
MONO ABS: 0.4 10*3/uL (ref 0.2–0.9)
MONOS PCT: 5 %
Monocytes Relative: 3 %
NEUTROS PCT: 80 %
Neutro Abs: 9.1 10*3/uL — ABNORMAL HIGH (ref 1.4–6.5)
Neutro Abs: 13.4 10*3/uL — ABNORMAL HIGH (ref 1.4–6.5)
Neutrophils Relative %: 92 %
PLATELETS: 233 10*3/uL (ref 150–440)
Platelets: 211 10*3/uL (ref 150–440)
RBC: 4.42 MIL/uL (ref 3.80–5.20)
RBC: 4.3 MIL/uL (ref 3.80–5.20)
RDW: 13.9 % (ref 11.5–14.5)
RDW: 13.6 % (ref 11.5–14.5)
WBC: 11.3 10*3/uL — ABNORMAL HIGH (ref 3.6–11.0)
WBC: 14.5 10*3/uL — ABNORMAL HIGH (ref 3.6–11.0)

## 2015-07-02 LAB — MAGNESIUM: Magnesium: 4.7 mg/dL — ABNORMAL HIGH (ref 1.7–2.4)

## 2015-07-02 LAB — ABO/RH: ABO/RH(D): O POS

## 2015-07-02 LAB — RPR: RPR: NONREACTIVE

## 2015-07-02 MED ORDER — LIDOCAINE HCL (PF) 1 % IJ SOLN
INTRAMUSCULAR | Status: DC | PRN
  Administered 2015-07-02: 1 mL via INTRADERMAL

## 2015-07-02 MED ORDER — BUPIVACAINE HCL (PF) 0.25 % IJ SOLN
INTRAMUSCULAR | Status: DC | PRN
  Administered 2015-07-02: 5 mL via EPIDURAL

## 2015-07-02 MED ORDER — IBUPROFEN 600 MG PO TABS
600.0000 mg | ORAL_TABLET | Freq: Four times a day (QID) | ORAL | Status: DC | PRN
  Administered 2015-07-02 – 2015-07-03 (×2): 600 mg via ORAL
  Filled 2015-07-02 (×2): qty 1

## 2015-07-02 MED ORDER — PRENATAL MULTIVITAMIN CH: 1.0000 | ORAL_TABLET | Freq: Every day | ORAL | Status: DC

## 2015-07-02 MED ORDER — LIDOCAINE-EPINEPHRINE (PF) 1.5 %-1:200000 IJ SOLN
INTRAMUSCULAR | Status: DC | PRN
  Administered 2015-07-02: 3 mL via EPIDURAL

## 2015-07-02 MED ORDER — MAGNESIUM SULFATE 50 % IJ SOLN
1.0000 g/h | INTRAVENOUS | Status: DC
  Administered 2015-07-02: 1 g/h via INTRAVENOUS

## 2015-07-02 MED ORDER — HYDROCODONE-ACETAMINOPHEN 5-325 MG PO TABS
1.0000 | ORAL_TABLET | ORAL | Status: DC | PRN
  Administered 2015-07-02 – 2015-07-03 (×3): 1 via ORAL
  Filled 2015-07-02 (×7): qty 1

## 2015-07-02 MED ORDER — FENTANYL 2.5 MCG/ML W/ROPIVACAINE 0.2% IN NS 100 ML EPIDURAL INFUSION (ARMC-ANES)
EPIDURAL | Status: AC
  Administered 2015-07-02: 10 mL/h via EPIDURAL
  Filled 2015-07-02: qty 100

## 2015-07-02 NOTE — Lactation Note (Signed)
This note was copied from the chart of Sonya Bauer. Lactation Consultation Note  Patient Name: Sonya Bauer Today's Date: 07/02/2015     Maternal Data    Feeding  Baby Braylie (sp?) very sleepy today and has spit up a fair amount of amniotic fluid. She has nursed twice for almost 5 minutes today and many attempts in between. Mom has practiced hand expression and given Braylie some drops to lick off. Her temp ws WNL this time and no jitteriness, etc. I offered options to Mom including pumping for 15 minutes, which Mom agreed to. Preemie setting on pump. No colostrum. Report given to RNs Cleotis Lema and Shirlean Mylar to continue to assist with feeds at least Q 2-3 hours and to pump prn for no or poor feeds. Watch for s/s hypoglycemia. If medically needed, baby could have approx 10 ml or so of formula via syringe or cup as needed. LC to F/U tomorrow.   LATCH Score/Interventions Latch: Repeated attempts needed to sustain latch, nipple held in mouth throughout feeding, stimulation needed to elicit sucking reflex. Intervention(s): Waking techniques Intervention(s): Breast compression  Audible Swallowing: None  Type of Nipple: Everted at rest and after stimulation (getting slightly flat-right side. Fuid retention?)        Hold (Positioning): Assistance needed to correctly position infant at breast and maintain latch.     Lactation Tools Discussed/Used Pump Review: Setup, frequency, and cleaning Initiated by:: sandra shields Date initiated:: 07/02/15   Consult Status Consult Status: Follow-up Date: 07/03/15 Follow-up type: In-patient    Roque Cash 07/02/2015, 4:02 PM

## 2015-07-02 NOTE — Anesthesia Preprocedure Evaluation (Signed)
Anesthesia Evaluation  Patient identified by MRN, date of birth, ID band Patient awake    Reviewed: Allergy & Precautions, H&P , NPO status , Patient's Chart, lab work & pertinent test results  Airway Mallampati: II  TM Distance: >3 FB Neck ROM: full    Dental no notable dental hx. (+) Teeth Intact   Pulmonary neg pulmonary ROS,    Pulmonary exam normal breath sounds clear to auscultation       Cardiovascular Exercise Tolerance: Good hypertension, Normal cardiovascular exam Rhythm:regular Rate:Normal     Neuro/Psych  Headaches, negative psych ROS   GI/Hepatic negative GI ROS, Neg liver ROS,   Endo/Other  negative endocrine ROS  Renal/GU negative Renal ROS  negative genitourinary   Musculoskeletal   Abdominal   Peds  Hematology negative hematology ROS (+)   Anesthesia Other Findings Past Medical History:   Headache                                                     Hypertension                                                 Thyroid cyst                                    11/16/14       Pregnancy induced hypertension                               Reproductive/Obstetrics (+) Pregnancy                             Anesthesia Physical Anesthesia Plan  ASA: III  Anesthesia Plan: Epidural   Post-op Pain Management:    Induction:   Airway Management Planned:   Additional Equipment:   Intra-op Plan:   Post-operative Plan:   Informed Consent: I have reviewed the patients History and Physical, chart, labs and discussed the procedure including the risks, benefits and alternatives for the proposed anesthesia with the patient or authorized representative who has indicated his/her understanding and acceptance.   Dental Advisory Given  Plan Discussed with: Anesthesiologist, CRNA and Surgeon  Anesthesia Plan Comments:         Anesthesia Quick Evaluation

## 2015-07-02 NOTE — Progress Notes (Signed)
Post Partum Day 0- is 12 hours post delivery Subjective: no complaints  Objective: Blood pressure 138/85, pulse 81, temperature 98.4 F (36.9 C), temperature source Oral, resp. rate 16, height 5\' 5"  (1.651 m), weight 78.926 kg (174 lb), last menstrual period 10/07/2014, SpO2 100 %, unknown if currently breastfeeding.  Physical Exam:  General: alert, cooperative, appears stated age and fatigued Lochia: appropriate Uterine Fundus: firm Incision: NA DVT Evaluation: No evidence of DVT seen on physical exam. Negative Homan's sign. Calf/Ankle edema is present. SCD in place   Recent Labs  07/02/15 0154 07/02/15 0653  HGB 12.8 12.6  HCT 37.4 36.6    Assessment/Plan: Continue PP Mag but decrease to 1gm/hr Breastfeeding Infant feeding soley Breast;    LOS: 1 day   Melody Burr 07/02/2015, 5:00 PM

## 2015-07-02 NOTE — Anesthesia Procedure Notes (Signed)
Epidural Patient location during procedure: OB Start time: 07/02/2015 2:55 AM End time: 07/02/2015 3:00 AM  Staffing Anesthesiologist: Katy Fitch K Performed by: anesthesiologist   Preanesthetic Checklist Completed: patient identified, site marked, surgical consent, pre-op evaluation, timeout performed, IV checked, risks and benefits discussed and monitors and equipment checked  Epidural Patient position: sitting Prep: Betadine Patient monitoring: heart rate, continuous pulse ox and blood pressure Approach: midline Location: L4-L5 Injection technique: LOR saline  Needle:  Needle type: Tuohy  Needle gauge: 18 G Needle length: 9 cm and 9 Needle insertion depth: 6 cm Catheter type: closed end Catheter size: 20 Guage Catheter at skin depth: 10 cm Test dose: negative and 1.5% lidocaine with Epi 1:200 K  Assessment Sensory level: T10 Events: blood not aspirated, injection not painful, no injection resistance, negative IV test and no paresthesia  Additional Notes   Patient tolerated the insertion well without complications.Reason for block:procedure for pain

## 2015-07-03 MED ORDER — WITCH HAZEL-GLYCERIN EX PADS
1.0000 | MEDICATED_PAD | CUTANEOUS | Status: DC | PRN
Start: 2015-07-03 — End: 2015-07-04

## 2015-07-03 MED ORDER — ACYCLOVIR 400 MG PO TABS
400.0000 mg | ORAL_TABLET | Freq: Two times a day (BID) | ORAL | Status: DC
  Filled 2015-07-03 (×4): qty 1

## 2015-07-03 MED ORDER — ACETAMINOPHEN 325 MG PO TABS: 650.0000 mg | ORAL_TABLET | ORAL | Status: DC | PRN

## 2015-07-03 MED ORDER — OXYCODONE-ACETAMINOPHEN 5-325 MG PO TABS: 1.0000 | ORAL_TABLET | ORAL | Status: DC | PRN

## 2015-07-03 MED ORDER — DIPHENHYDRAMINE HCL 25 MG PO CAPS
25.0000 mg | ORAL_CAPSULE | Freq: Four times a day (QID) | ORAL | Status: DC | PRN
Start: 2015-07-03 — End: 2015-07-04

## 2015-07-03 MED ORDER — LANOLIN HYDROUS EX OINT: TOPICAL_OINTMENT | CUTANEOUS | Status: DC | PRN

## 2015-07-03 MED ORDER — SIMETHICONE 80 MG PO CHEW: 80.0000 mg | CHEWABLE_TABLET | ORAL | Status: DC | PRN

## 2015-07-03 MED ORDER — DIBUCAINE 1 % RE OINT: 1.0000 "application " | TOPICAL_OINTMENT | RECTAL | Status: DC | PRN

## 2015-07-03 MED ORDER — SENNOSIDES-DOCUSATE SODIUM 8.6-50 MG PO TABS
2.0000 | ORAL_TABLET | ORAL | Status: DC
  Administered 2015-07-03: 2 via ORAL
  Filled 2015-07-03: qty 2

## 2015-07-03 MED ORDER — BENZOCAINE-MENTHOL 20-0.5 % EX AERO: 1.0000 "application " | INHALATION_SPRAY | CUTANEOUS | Status: DC | PRN

## 2015-07-03 MED ORDER — IBUPROFEN 600 MG PO TABS
600.0000 mg | ORAL_TABLET | Freq: Four times a day (QID) | ORAL | Status: DC
  Administered 2015-07-03 – 2015-07-04 (×4): 600 mg via ORAL
  Filled 2015-07-03 (×4): qty 1

## 2015-07-03 MED ORDER — OXYCODONE-ACETAMINOPHEN 5-325 MG PO TABS
2.0000 | ORAL_TABLET | ORAL | Status: DC | PRN
  Administered 2015-07-03: 2 via ORAL
  Filled 2015-07-03: qty 2

## 2015-07-03 MED ORDER — PRENATAL MULTIVITAMIN CH: 1.0000 | ORAL_TABLET | Freq: Every day | ORAL | Status: DC

## 2015-07-03 MED ORDER — ONDANSETRON HCL 4 MG/2ML IJ SOLN: 4.0000 mg | INTRAMUSCULAR | Status: DC | PRN

## 2015-07-03 MED ORDER — ONDANSETRON HCL 4 MG PO TABS: 4.0000 mg | ORAL_TABLET | ORAL | Status: DC | PRN

## 2015-07-03 NOTE — Plan of Care (Signed)
Magnesium Sulfate discontinued at 0500. First assessment continues to be negative. Pericare repeated with scant lochia noted; prefers to go without ice on perineum. Patient is comfortable holding her infant daughter and looking forward to continued improvement of her vital signs. Rates pain as zero and declines pain medication at this time. SCD's remain on, BP checks q hour, foley draining yellow urine. Deanna Artis, RN

## 2015-07-03 NOTE — Progress Notes (Signed)
Post Partum Day 1 Subjective: c/o back pain only; still on bedrest  Objective: Blood pressure 138/86, pulse 76, temperature 98.8 F (37.1 C), temperature source Oral, resp. rate 16, height 5\' 5"  (1.651 m), weight 78.926 kg (174 lb), last menstrual period 10/07/2014, SpO2 99 %, unknown if currently breastfeeding.  Physical Exam:  General: alert, cooperative, appears stated age and fatigued Lochia: appropriate Uterine Fundus: firm Incision: NA DVT Evaluation: No evidence of DVT seen on physical exam. Negative Homan's sign. Calf/Ankle edema is present.   Recent Labs  07/02/15 0154 07/02/15 0653  HGB 12.8 12.6  HCT 37.4 36.6    Assessment/Plan: Breastfeeding Infant feeding soley Breast; D/c IVF and foley and transfer to floor    LOS: 2 days   Melody Burr 07/03/2015, 8:02 AM

## 2015-07-04 LAB — CBC
HEMATOCRIT: 30.4 % — AB (ref 35.0–47.0)
Hemoglobin: 10.1 g/dL — ABNORMAL LOW (ref 12.0–16.0)
MCH: 28.7 pg (ref 26.0–34.0)
MCHC: 33.4 g/dL (ref 32.0–36.0)
MCV: 86 fL (ref 80.0–100.0)
Platelets: 205 10*3/uL (ref 150–440)
RBC: 3.53 MIL/uL — ABNORMAL LOW (ref 3.80–5.20)
RDW: 14.2 % (ref 11.5–14.5)
WBC: 8.2 10*3/uL (ref 3.6–11.0)

## 2015-07-04 MED ORDER — VITAMIN D 50 MCG (2000 UT) PO TABS: 2000.0000 [IU] | ORAL_TABLET | Freq: Every day | ORAL | Status: DC

## 2015-07-04 MED ORDER — FUSION PLUS PO CAPS: 1.0000 | ORAL_CAPSULE | Freq: Every day | ORAL | Status: DC

## 2015-07-04 NOTE — Anesthesia Postprocedure Evaluation (Signed)
  Anesthesia Post-op Note  Patient: Sonya Bauer  Procedure(s) Performed: * No procedures listed *  Anesthesia type:No value filed.  Patient location: PACU  Post pain: Pain level controlled  Post assessment: Post-op Vital signs reviewed, Patient's Cardiovascular Status Stable, Respiratory Function Stable, Patent Airway and No signs of Nausea or vomiting  Post vital signs: Reviewed and stable  Last Vitals:  Filed Vitals:   07/03/15 1950  BP: 154/77  Pulse: 66  Temp: 36.9 C  Resp: 20    Level of consciousness: awake, alert  and patient cooperative  Complications: No apparent anesthesia complications

## 2015-07-04 NOTE — Progress Notes (Signed)
Pt and husband discharged to home with newborn via w/c.  Pt verb u/o of discharge instructions.  Rx meds called into her pharmacy by midwife.

## 2015-07-04 NOTE — Discharge Summary (Signed)
Obstetric Discharge Summary Reason for Admission: induction of labor Prenatal Procedures: NST, Preeclampsia and ultrasound Intrapartum Procedures: spontaneous vaginal delivery Postpartum Procedures: none Complications-Operative and Postpartum: none HEMOGLOBIN  Date Value Ref Range Status  07/04/2015 10.1* 12.0 - 16.0 g/dL Final   HCT  Date Value Ref Range Status  07/04/2015 30.4* 35.0 - 47.0 % Final   HEMATOCRIT  Date Value Ref Range Status  04/17/2015 34.5 34.0 - 46.6 % Final    Physical Exam:  General: alert, cooperative, appears stated age and fatigued Lochia: appropriate Uterine Fundus: firm Incision: NA DVT Evaluation: No evidence of DVT seen on physical exam. Negative Homan's sign. Calf/Ankle edema is present.  Discharge Diagnoses: Term Pregnancy-delivered and Preelampsia  Discharge Information: Date: 07/04/2015 Activity: pelvic rest and modified activity x 2 weeks Diet: routine Medications: PNV, Ibuprofen, Iron and acyclovir, Vit D3 Condition: stable and improved Instructions: refer to practice specific booklet Discharge to: home   Newborn Data: Live born female  Birth Weight: 6 lb 6.7 oz (2910 g) APGAR: 8, 9  Home with mother.  Melody Burr 07/04/2015, 8:23 AM

## 2015-07-05 ENCOUNTER — Telehealth: Payer: Self-pay | Admitting: Obstetrics and Gynecology

## 2015-07-05 ENCOUNTER — Other Ambulatory Visit: Payer: Medicaid Other

## 2015-07-05 NOTE — Telephone Encounter (Signed)
error 

## 2015-07-08 ENCOUNTER — Emergency Department
Admission: EM | Admit: 2015-07-08 | Discharge: 2015-07-08 | Disposition: A | Discharge status: Home / Self Care | Payer: Medicaid Other | Attending: Emergency Medicine | Admitting: Emergency Medicine

## 2015-07-08 DIAGNOSIS — I158 Other secondary hypertension: Secondary | ICD-10-CM | POA: Insufficient documentation

## 2015-07-08 DIAGNOSIS — Z7951 Long term (current) use of inhaled steroids: Secondary | ICD-10-CM | POA: Insufficient documentation

## 2015-07-08 DIAGNOSIS — I10 Essential (primary) hypertension: Secondary | ICD-10-CM | POA: Diagnosis present

## 2015-07-08 DIAGNOSIS — Z79899 Other long term (current) drug therapy: Secondary | ICD-10-CM | POA: Insufficient documentation

## 2015-07-08 LAB — COMPREHENSIVE METABOLIC PANEL
ALBUMIN: 3.5 g/dL (ref 3.5–5.0)
ALT: 16 U/L (ref 14–54)
ANION GAP: 6 (ref 5–15)
AST: 22 U/L (ref 15–41)
Alkaline Phosphatase: 107 U/L (ref 38–126)
BUN: 13 mg/dL (ref 6–20)
CHLORIDE: 112 mmol/L — AB (ref 101–111)
CO2: 25 mmol/L (ref 22–32)
Calcium: 8.9 mg/dL (ref 8.9–10.3)
Creatinine, Ser: 0.56 mg/dL (ref 0.44–1.00)
GFR calc Af Amer: >60 mL/min (ref >60)
GFR calc non Af Amer: >60 mL/min (ref >60)
GLUCOSE: 76 mg/dL (ref 65–99)
POTASSIUM: 4 mmol/L (ref 3.5–5.1)
SODIUM: 143 mmol/L (ref 135–145)
Total Bilirubin: 0.5 mg/dL (ref 0.3–1.2)
Total Protein: 7.3 g/dL (ref 6.5–8.1)

## 2015-07-08 LAB — CBC
HEMATOCRIT: 34.9 % — AB (ref 35.0–47.0)
HEMOGLOBIN: 11.9 g/dL — AB (ref 12.0–16.0)
MCH: 28.9 pg (ref 26.0–34.0)
MCHC: 34 g/dL (ref 32.0–36.0)
MCV: 84.9 fL (ref 80.0–100.0)
Platelets: 320 10*3/uL (ref 150–440)
RBC: 4.11 MIL/uL (ref 3.80–5.20)
RDW: 13.5 % (ref 11.5–14.5)
WBC: 9.9 10*3/uL (ref 3.6–11.0)

## 2015-07-08 LAB — URINALYSIS COMPLETE WITH MICROSCOPIC (ARMC ONLY)
BACTERIA UA: NONE SEEN
Bilirubin Urine: NEGATIVE
GLUCOSE, UA: NEGATIVE mg/dL
Ketones, ur: NEGATIVE mg/dL
Nitrite: NEGATIVE
PROTEIN: NEGATIVE mg/dL
SPECIFIC GRAVITY, URINE: 1.021 (ref 1.005–1.030)
pH: 5 (ref 5.0–8.0)

## 2015-07-08 LAB — MAGNESIUM: MAGNESIUM: 2 mg/dL (ref 1.7–2.4)

## 2015-07-08 MED ORDER — LABETALOL HCL 5 MG/ML IV SOLN
10.0000 mg | Freq: Once | INTRAVENOUS | Status: AC
  Administered 2015-07-08: 10 mg via INTRAVENOUS
  Filled 2015-07-08: qty 4

## 2015-07-08 MED ORDER — LABETALOL HCL 200 MG PO TABS: 200.0000 mg | ORAL_TABLET | Freq: Two times a day (BID) | ORAL | Status: DC

## 2015-07-08 MED ORDER — LABETALOL HCL 5 MG/ML IV SOLN
INTRAVENOUS | Status: AC
  Filled 2015-07-08: qty 4

## 2015-07-08 NOTE — Discharge Instructions (Signed)

## 2015-07-08 NOTE — ED Notes (Signed)
Pt reports she had a baby last week and her BP has been running high. Pt denies HA, reports has felt dizzy intermittently. Pt reports started with HTN at 67 weeks, gave birth at 21 weeks. Pt reports was being treated for pre-eclampsia.

## 2015-07-08 NOTE — ED Provider Notes (Signed)
[facility] Emergency Department Provider Note  ____________________________________________  Time seen: Approximately 2:05 PM  I have reviewed the triage vital signs and the nursing notes.   HISTORY  Chief Complaint Hypertension    HPI Sonya Bauer is a 24 y.o. female since emergency room complaining of elevated blood pressure. She states that she was preeclamptic/eclamptic during her pregnancy and gave birth 6 days prior to arrival. She states that she was placed on bed rest starting a week 36 and delivered at week 38. She denies any kind of hypertensive medications given during pregnancy and states that only in the last 24 hours so she placed on magnesium. She states that this time and she has been watching her blood pressure for the last week and has remained elevated in the 140s are 150s over 90s. She denies any acute symptoms but just endorses some malaise with decreased appetite and some musculoskeletal aches primarily in the lower back hip region. She does have a history of hypertension approximately 2 years prior that was only 2 birth control. She stopped her birth control at that time and states that her blood pressure normally runs in the 120s over the 80s. He denies any headache, numbness or tingling, decreased strength, nausea/vomiting. She does endorse mild blurred vision and vertigo. She does endorse poor oral intake of both solids and liquids since delivery. She tried to reach her OB/GYN for this complaint but states that they're out of town until next week so she presented to the emergency department.   Past Medical History  Diagnosis Date  . Headache   . Hypertension   . 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 Pregnancy induced hypertension     Patient Active Problem List   Diagnosis Date Noted  . Vaginal delivery 07/03/2015  . Preeclampsia 07/01/2015  . Pregnancy 06/13/2015  . 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 Elevated BP 05/08/2015    No past surgical history  on file.  Current Outpatient Rx  Name  Route  Sig  Dispense  Refill  . acyclovir (ZOVIRAX) 400 MG tablet   Oral   Take 400 mg by mouth 2 (two) times daily.         . Cholecalciferol (VITAMIN D) 2000 UNITS tablet   Oral   Take 1 tablet (2,000 Units total) by mouth daily.   120 tablet   4   . fluticasone (FLONASE) 50 MCG/ACT nasal spray   Each Nare   Place into both nostrils daily.         . Iron-FA-B Cmp-C-Biot-Probiotic (FUSION PLUS) CAPS   Oral   Take 1 capsule by mouth daily.   60 capsule   1   . labetalol (NORMODYNE) 200 MG tablet   Oral   Take 1 tablet (200 mg total) by mouth 2 (two) times daily.   20 tablet   0   . Prenatal Vit-Fe Fumarate-FA (PRENATAL MULTIVITAMIN) TABS tablet   Oral   Take 1 tablet by mouth daily at 12 noon.           Allergies Review of patient's allergies indicates no known allergies.  Family History  Problem Relation Age of Onset  . Hypertension Father     Social History Social History  Substance Use Topics  . Smoking status: Never Smoker   . Smokeless tobacco: Never Used  . Alcohol Use: No    Review of Systems Constitutional: No fever/chills Eyes: No visual changes. ENT: No sore throat. Cardiovascular: Denies chest pain. Respiratory: Denies shortness of  breath. Gastrointestinal: No abdominal pain.  No nausea, no vomiting.  No diarrhea.  No constipation. Genitourinary: Negative for dysuria. Musculoskeletal: Negative for back pain. Skin: Negative for rash. Neurological: Negative for headaches, focal weakness or numbness.  10-point ROS otherwise negative.  ____________________________________________   PHYSICAL EXAM:  VITAL SIGNS: ED Triage Vitals  Enc Vitals Group     BP 07/08/15 1010 148/100 mmHg     Pulse Rate 07/08/15 1010 91     Resp 07/08/15 1010 20     Temp 07/08/15 1010 98.8 F (37.1 C)     Temp Source 07/08/15 1010 Oral     SpO2 07/08/15 1010 97 %     Weight 07/08/15 1010 180 lb (81.647 kg)      Height 07/08/15 1010 5\' 5"  (1.651 m)     Head Cir --      Peak Flow --      Pain Score 07/08/15 1011 6     Pain Loc --      Pain Edu? --      Excl. in [place]? --     Constitutional: Alert and oriented. Well appearing and in no acute distress. Eyes: Conjunctivae are normal. PERRL. EOMI. Head: Atraumatic. Nose: No congestion/rhinnorhea. Mouth/Throat: Mucous membranes are moist.  Oropharynx non-erythematous. Neck: No stridor.   Hematological/Lymphatic/Immunilogical: No cervical lymphadenopathy. Cardiovascular: Normal rate, regular rhythm. Grossly normal heart sounds.  Good peripheral circulation. Respiratory: Normal respiratory effort.  No retractions. Lungs CTAB. Gastrointestinal: Soft and nontender. No distention. No abdominal bruits. No CVA tenderness. Musculoskeletal: No lower extremity tenderness nor edema.  No joint effusions. Neurologic:  Normal speech and language. No gross focal neurologic deficits are appreciated. Cranial nerves II-12 grossly intact. No gait instability. Skin:  Skin is warm, dry and intact. No rash noted. Psychiatric: Mood and affect are normal. Speech and behavior are normal.  ____________________________________________   LABS (all labs ordered are listed, but only abnormal results are displayed)  Labs Reviewed  COMPREHENSIVE METABOLIC PANEL - Abnormal; Notable for the following:    Chloride 112 (*)    All other components within normal limits  CBC - Abnormal; Notable for the following:    Hemoglobin 11.9 (*)    HCT 34.9 (*)    All other components within normal limits  URINALYSIS COMPLETEWITH MICROSCOPIC ([facility] ONLY) - Abnormal; Notable for the following:    Color, Urine YELLOW (*)    APPearance CLEAR (*)    Hgb urine dipstick 2+ (*)    Leukocytes, UA 1+ (*)    Squamous Epithelial / LPF 0-5 (*)    All other components within normal limits  MAGNESIUM    ____________________________________________  EKG   ____________________________________________  RADIOLOGY   ____________________________________________   PROCEDURES  Procedure(s) performed: None  Critical Care performed: No  ____________________________________________   INITIAL IMPRESSION / ASSESSMENT AND PLAN / ED COURSE  Pertinent labs & imaging results that were available during my care of the patient were reviewed by me and considered in my medical decision making (see chart for details).  Patient presented to the emergency department complaining of elevated blood pressure for a week status post vaginal delivery. She states that she was monitored for preeclampsia during her pregnancy and was given magnesium in 24 hours and delivery. She endorsed some dizziness and mild vertigo. Her lab work while here return mostly within normal limits. There was no protein in her urine, mag levels were appropriate, and no gross abnormality of her CBC or CMP. We called the on call OB/GYN  for her practice, Dr. Marcelline Mates, who advised [place] to give 10 mg IV labetalol and assess her blood pressure. Reevaluation showed that the patient's blood pressure was coming back down into the 130s over 80s. Discussed treatment plan and follow-up with patient. She advises understanding and compliance. Will discharge patient home with a prescription for 200 mg labetalol by mouth daily for OB/GYN. She will follow up with their practice 2 days. ____________________________________________   FINAL CLINICAL IMPRESSION(S) / ED DIAGNOSES  Final diagnoses:  Other secondary hypertension      Darletta Moll, PA-C 07/08/15 1619  Lavonia Drafts, MD 07/10/15 1301

## 2015-07-08 NOTE — ED Notes (Signed)
Placed on cardiac monitor prior to med administraion.

## 2015-07-08 NOTE — ED Notes (Signed)
Pt says no head ache just dizziness.  Says back hurts and rad down bilat to both ankles mostly on right side.  Pt in nad.  Had vag delivery last Tuesday.  Says baby is doing well.  No fever.

## 2015-07-11 ENCOUNTER — Ambulatory Visit (INDEPENDENT_AMBULATORY_CARE_PROVIDER_SITE_OTHER): Payer: Medicaid Other | Admitting: Obstetrics and Gynecology

## 2015-07-11 ENCOUNTER — Encounter: Payer: Self-pay | Admitting: Obstetrics and Gynecology

## 2015-07-11 ENCOUNTER — Ambulatory Visit: Payer: Medicaid Other | Admitting: Obstetrics and Gynecology

## 2015-07-11 VITALS — BP 122/78 | HR 63 | Ht 65.0 in | Wt 155.7 lb

## 2015-07-11 DIAGNOSIS — H539 Unspecified visual disturbance: Secondary | ICD-10-CM

## 2015-07-11 DIAGNOSIS — O1410 Severe pre-eclampsia, unspecified trimester: Secondary | ICD-10-CM

## 2015-07-11 NOTE — Patient Instructions (Signed)
Kegel Exercises The goal of Kegel exercises is to isolate and exercise your pelvic floor muscles. These muscles act as a hammock that supports the rectum, vagina, small intestine, and uterus. As the muscles weaken, the hammock sags and these organs are displaced from their normal positions. Kegel exercises can strengthen your pelvic floor muscles and help you to improve bladder and bowel control, improve sexual response, and help reduce many problems and some discomfort during pregnancy. Kegel exercises can be done anywhere and at any time. HOW TO PERFORM KEGEL EXERCISES 1. Locate your pelvic floor muscles. To do this, squeeze (contract) the muscles that you use when you try to stop the flow of urine. You will feel a tightness in the vaginal area (women) and a tight lift in the rectal area (men and women). 2. When you begin, contract your pelvic muscles tight for 2-5 seconds, then relax them for 2-5 seconds. This is one set. Do 4-5 sets with a short pause in between. 3. Contract your pelvic muscles for 8-10 seconds, then relax them for 8-10 seconds. Do 4-5 sets. If you cannot contract your pelvic muscles for 8-10 seconds, try 5-7 seconds and work your way up to 8-10 seconds. Your goal is 4-5 sets of 10 contractions each day. Keep your stomach, buttocks, and legs relaxed during the exercises. Perform sets of both short and long contractions. Vary your positions. Perform these contractions 3-4 times per day. Perform sets while you are:   Lying in bed in the morning.  Standing at lunch.  Sitting in the late afternoon.  Lying in bed at night. You should do 40-50 contractions per day. Do not perform more Kegel exercises per day than recommended. Overexercising can cause muscle fatigue. Continue these exercises for for at least 15-20 weeks or as directed by your caregiver. Document Released: 09/14/2012 Document Reviewed: 09/14/2012 ExitCare Patient Information 2015 [employer]. This information is  not intended to replace advice given to you by your health care provider. Make sure you discuss any questions you have with your health care provider.  

## 2015-07-14 NOTE — Progress Notes (Signed)
GYNECOLOGY PROGRESS NOTE  Subjective:    Patient ID: Sonya Bauer, female    DOB: 02-Aug-1991, 24 y.o.   MRN: [id number]  HPI  Patient is a 24 y.o. G32P1001 female who presents for f/u from ER visit.  Patient is 1 week postpartum s/p SVD, with pregnancy complicated by mild pre-eclampsia, was treated with Magnesium sulfate during antepartum and postpartum period.  Patient reports that 2 days ago she began to feel dizzy and note mild blurred vision.  Went to the ER where BPs were noted to be elevated (148/100).  Denied headaches, RUQ pain. PIH labs were stable (no change in labs since discharge from hospital last week).  Was started on Labetaol to lower BPs and instructed to f/u in 2 days in office.   Notes that overall she has been feeling better, but still notes intermittent episodes of blurred vision.   The following portions of the patient's history were reviewed and updated as appropriate: allergies, current medications, past family history, past medical history, past social history, past surgical history and problem list.  Review of Systems A comprehensive review of systems was negative except for: Gastrointestinal: positive for change in bowel habits and occasional fecal incontinence   Objective:   Blood pressure 122/78, pulse 63, height 5' 5"  (1.651 m), weight 155 lb 11.2 oz (70.625 kg), last menstrual period 10/07/2014, currently breastfeeding. General appearance: alert and no distress Abdomen: soft, non-tender; bowel sounds normal; no masses,  no organomegaly Pelvic: deferred    Labs:  Admission on 07/08/2015, Discharged on 07/08/2015  Component Date Value Ref Range Status  . Sodium 07/08/2015 143  135 - 145 mmol/L Final  . Potassium 07/08/2015 4.0  3.5 - 5.1 mmol/L Final  . Chloride 07/08/2015 112* 101 - 111 mmol/L Final  . CO2 07/08/2015 25  22 - 32 mmol/L Final  . Glucose, Bld 07/08/2015 76  65 - 99 mg/dL Final  . BUN 07/08/2015 13  6 - 20 mg/dL Final  . Creatinine, Ser  07/08/2015 0.56  0.44 - 1.00 mg/dL Final  . Calcium 07/08/2015 8.9  8.9 - 10.3 mg/dL Final  . Total Protein 07/08/2015 7.3  6.5 - 8.1 g/dL Final  . Albumin 07/08/2015 3.5  3.5 - 5.0 g/dL Final  . AST 07/08/2015 22  15 - 41 U/L Final  . ALT 07/08/2015 16  14 - 54 U/L Final  . Alkaline Phosphatase 07/08/2015 107  38 - 126 U/L Final  . Total Bilirubin 07/08/2015 0.5  0.3 - 1.2 mg/dL Final  . GFR calc non Af Amer 07/08/2015 >60  >60 mL/min Final  . GFR calc Af Amer 07/08/2015 >60  >60 mL/min Final   Comment: (NOTE) The eGFR has been calculated using the CKD EPI equation. This calculation has not been validated in all clinical situations. eGFR's persistently <60 mL/min signify possible Chronic Kidney Disease.   . Anion gap 07/08/2015 6  5 - 15 Final  . WBC 07/08/2015 9.9  3.6 - 11.0 K/uL Final  . RBC 07/08/2015 4.11  3.80 - 5.20 MIL/uL Final  . Hemoglobin 07/08/2015 11.9* 12.0 - 16.0 g/dL Final  . HCT 07/08/2015 34.9* 35.0 - 47.0 % Final  . MCV 07/08/2015 84.9  80.0 - 100.0 fL Final  . MCH 07/08/2015 28.9  26.0 - 34.0 pg Final  . MCHC 07/08/2015 34.0  32.0 - 36.0 g/dL Final  . RDW 07/08/2015 13.5  11.5 - 14.5 % Final  . Platelets 07/08/2015 320  150 - 440 K/uL Final  .  Color, Urine 07/08/2015 YELLOW* YELLOW Final  . APPearance 07/08/2015 CLEAR* CLEAR Final  . Glucose, UA 07/08/2015 NEGATIVE  NEGATIVE mg/dL Final  . Bilirubin Urine 07/08/2015 NEGATIVE  NEGATIVE Final  . Ketones, ur 07/08/2015 NEGATIVE  NEGATIVE mg/dL Final  . Specific Gravity, Urine 07/08/2015 1.021  1.005 - 1.030 Final  . Hgb urine dipstick 07/08/2015 2+* NEGATIVE Final  . pH 07/08/2015 5.0  5.0 - 8.0 Final  . Protein, ur 07/08/2015 NEGATIVE  NEGATIVE mg/dL Final  . Nitrite 07/08/2015 NEGATIVE  NEGATIVE Final  . Leukocytes, UA 07/08/2015 1+* NEGATIVE Final  . RBC / HPF 07/08/2015 0-5  0 - 5 RBC/hpf Final  . WBC, UA 07/08/2015 6-30  0 - 5 WBC/hpf Final  . Bacteria, UA 07/08/2015 NONE SEEN  NONE SEEN Final  .  Squamous Epithelial / LPF 07/08/2015 0-5* NONE SEEN Final  . Mucous 07/08/2015 PRESENT   Final  . Magnesium 07/08/2015 2.0  1.7 - 2.4 mg/dL Final     Assessment:   Mild pre-eclampsia Visual disturbances  Plan:   1. Continue Labetalol 200 mg BID.  BPs well controlled. Denies other pre-ecalmpsia symptoms outside of intermittent visual disturbances. To continue until next postpartum check.  2. Will have patient f/u with Opthalmologist for visual changes as soon as possible to r/o any retinopathy.   3. Reiterated signs/symptoms of pre-eclampsia, to return sooner than next appointment if new symptoms develop or current symptoms worsen.  4.  To f/u in 4 weeks, or sooner if symptoms persist.    Rubie Maid, MD [facility]

## 2015-07-22 ENCOUNTER — Telehealth: Payer: Self-pay | Admitting: Obstetrics and Gynecology

## 2015-07-22 NOTE — Telephone Encounter (Signed)
Sonya Bauer DOESN'T HAVE ANY BP MED REFILLS THAT THE ER GAVE HER. CAN YOU CALL HER [name]

## 2015-07-23 MED ORDER — LABETALOL HCL 200 MG PO TABS: 200.0000 mg | ORAL_TABLET | Freq: Two times a day (BID) | ORAL | Status: DC

## 2015-07-23 NOTE — Telephone Encounter (Signed)
Sure.  Please let patient know that I have sent in a refill to her pharmacy.

## 2015-08-08 ENCOUNTER — Ambulatory Visit (INDEPENDENT_AMBULATORY_CARE_PROVIDER_SITE_OTHER): Payer: Medicaid Other | Admitting: Obstetrics and Gynecology

## 2015-08-08 ENCOUNTER — Encounter: Payer: Self-pay | Admitting: Obstetrics and Gynecology

## 2015-08-08 DIAGNOSIS — O9229 Other disorders of breast associated with pregnancy and the puerperium: Secondary | ICD-10-CM

## 2015-08-08 DIAGNOSIS — O165 Unspecified maternal hypertension, complicating the puerperium: Secondary | ICD-10-CM

## 2015-08-08 MED ORDER — NORETHINDRONE 0.35 MG PO TABS: 1.0000 | ORAL_TABLET | Freq: Every day | ORAL | Status: DC

## 2015-08-08 MED ORDER — ACYCLOVIR 400 MG PO TABS: 400.0000 mg | ORAL_TABLET | Freq: Two times a day (BID) | ORAL | Status: DC

## 2015-08-08 NOTE — Progress Notes (Signed)
  Subjective:     Sonya Bauer is a 24 y.o. female who presents for a postpartum visit. She is 5 weeks postpartum following a spontaneous vaginal delivery. I have fully reviewed the prenatal and intrapartum course. The delivery was at 61 gestational weeks. Outcome: spontaneous vaginal delivery. Anesthesia: epidural. Postpartum course has been complicated by persistant hypertension and infant nursing issues. Baby's course has been uneventful. Baby is feeding by breast. Bleeding no bleeding. Bowel function is normal. Bladder function is normal. Patient is not sexually active. Contraception method is oral progesterone-only contraceptive. Postpartum depression screening: negative.  The following portions of the patient's history were reviewed and updated as appropriate: allergies, current medications, past family history, past medical history, past social history, past surgical history and problem list.  Review of Systems Pertinent items noted in HPI and remainder of comprehensive ROS otherwise negative.   Objective:    BP 134/98 mmHg  Pulse 88  Breastfeeding? Yes  General:  alert, cooperative and appears stated age   Breasts:  inspection negative, no nipple discharge or bleeding, no masses or nodularity palpable and nipple red and tender bilaterally  Lungs: clear to auscultation bilaterally  Heart:  regular rate and rhythm, S1, S2 normal, no murmur, click, rub or gallop  Abdomen: soft, non-tender; bowel sounds normal; no masses,  no organomegaly   Vulva:  normal  Vagina: normal vagina, no discharge, exudate, lesion, or erythema  Cervix:  multiparous appearance  Corpus: normal size, contour, position, consistency, mobility, non-tender  Adnexa:  normal adnexa and no mass, fullness, tenderness  Rectal Exam: Not performed.        Assessment:     5 week postpartum exam. Pap smear not done at today's visit.   Plan:    1. Contraception: oral progesterone-only contraceptive 2. Postpartum  hypertension- to continue lobetalol bid Nipple pain- rx for all purpose nipple cream given 3. Follow up in: 3 months for AE or as needed.

## 2015-08-09 LAB — COMPREHENSIVE METABOLIC PANEL
A/G RATIO: 2 (ref 1.1–2.5)
ALBUMIN: 4.7 g/dL (ref 3.5–5.5)
ALK PHOS: 112 IU/L (ref 39–117)
ALT: 14 IU/L (ref 0–32)
AST: 14 IU/L (ref 0–40)
BILIRUBIN TOTAL: 0.7 mg/dL (ref 0.0–1.2)
BUN / CREAT RATIO: 18 (ref 8–20)
BUN: 12 mg/dL (ref 6–20)
CO2: 22 mmol/L (ref 18–29)
CREATININE: 0.68 mg/dL (ref 0.57–1.00)
Calcium: 9.8 mg/dL (ref 8.7–10.2)
Chloride: 101 mmol/L (ref 97–106)
GFR calc Af Amer: 142 mL/min/{1.73_m2} (ref >59)
GFR calc non Af Amer: 123 mL/min/{1.73_m2} (ref >59)
GLOBULIN, TOTAL: 2.4 g/dL (ref 1.5–4.5)
Glucose: 79 mg/dL (ref 65–99)
POTASSIUM: 4.4 mmol/L (ref 3.5–5.2)
SODIUM: 140 mmol/L (ref 136–144)
Total Protein: 7.1 g/dL (ref 6.0–8.5)

## 2015-08-09 LAB — CBC
HEMOGLOBIN: 14.1 g/dL (ref 11.1–15.9)
Hematocrit: 40.9 % (ref 34.0–46.6)
MCH: 28.8 pg (ref 26.6–33.0)
MCHC: 34.5 g/dL (ref 31.5–35.7)
MCV: 84 fL (ref 79–97)
PLATELETS: 306 10*3/uL (ref 150–379)
RBC: 4.9 x10E6/uL (ref 3.77–5.28)
RDW: 13.4 % (ref 12.3–15.4)
WBC: 5.8 10*3/uL (ref 3.4–10.8)

## 2015-08-09 LAB — THYROID PANEL WITH TSH
Free Thyroxine Index: 1.8 (ref 1.2–4.9)
T3 Uptake Ratio: 24 % (ref 24–39)
T4 TOTAL: 7.6 ug/dL (ref 4.5–12.0)
TSH: 1.27 u[IU]/mL (ref 0.450–4.500)

## 2015-08-09 LAB — FERRITIN: Ferritin: 73 ng/mL (ref 15–150)

## 2015-08-09 LAB — VITAMIN D 25 HYDROXY (VIT D DEFICIENCY, FRACTURES): VIT D 25 HYDROXY: 29.7 ng/mL — AB (ref 30.0–100.0)

## 2015-08-13 ENCOUNTER — Telehealth: Payer: Self-pay | Admitting: *Deleted

## 2015-08-13 NOTE — Telephone Encounter (Signed)
Notified pt she will try to remember to take Vit D OTC qd

## 2015-08-13 NOTE — Telephone Encounter (Signed)
-----   Message from Evonnie Pat, [place] sent at 08/09/2015  8:24 AM EDT ----- Let her know labs look good except vit D is a little low- to make sure she is taking 2000 IU Vit D3 daily (OTC)

## 2015-10-13 DIAGNOSIS — D229 Melanocytic nevi, unspecified: Secondary | ICD-10-CM

## 2015-10-13 HISTORY — DX: Melanocytic nevi, unspecified: D22.9

## 2015-11-08 ENCOUNTER — Other Ambulatory Visit: Payer: Self-pay | Admitting: Obstetrics and Gynecology

## 2015-11-08 ENCOUNTER — Encounter: Payer: Self-pay | Admitting: Obstetrics and Gynecology

## 2015-11-08 ENCOUNTER — Ambulatory Visit (INDEPENDENT_AMBULATORY_CARE_PROVIDER_SITE_OTHER): Payer: Medicaid Other | Admitting: Obstetrics and Gynecology

## 2015-11-08 VITALS — BP 121/92 | HR 98 | Ht 65.0 in | Wt 152.4 lb

## 2015-11-08 DIAGNOSIS — Z01419 Encounter for gynecological examination (general) (routine) without abnormal findings: Secondary | ICD-10-CM

## 2015-11-08 DIAGNOSIS — O924 Hypogalactia: Secondary | ICD-10-CM

## 2015-11-08 DIAGNOSIS — I159 Secondary hypertension, unspecified: Secondary | ICD-10-CM | POA: Diagnosis not present

## 2015-11-08 DIAGNOSIS — Z0000 Encounter for general adult medical examination without abnormal findings: Secondary | ICD-10-CM

## 2015-11-08 MED ORDER — METOCLOPRAMIDE HCL 10 MG PO TABS: 10.0000 mg | ORAL_TABLET | Freq: Three times a day (TID) | ORAL | Status: DC

## 2015-11-08 MED ORDER — NIFEDIPINE ER OSMOTIC RELEASE 30 MG PO TB24: 30.0000 mg | ORAL_TABLET | Freq: Every day | ORAL | Status: DC

## 2015-11-08 NOTE — Progress Notes (Signed)
Subjective:   Sonya Bauer is a 25 y.o. G15P1001 Caucasian female here for a routine well-woman exam.  No LMP recorded.    Current complaints: decreased milk production x 3 weeks, stopped blood pressure meds due to persistant HA while taking them, feels better but BP still elevated. PCP: me       Does need labs  Social History: Sexual: heterosexual Marital Status: co-habitating Living situation: with partner / significant other Occupation: unknown occupation Tobacco/alcohol: no tobacco use Illicit drugs: no history of illicit drug use  The following portions of the patient's history were reviewed and updated as appropriate: allergies, current medications, past family history, past medical history, past social history, past surgical history and problem list.  Past Medical History Past Medical History  Diagnosis Date  . Headache   . Hypertension   . 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 Pregnancy induced hypertension     Past Surgical History History reviewed. No pertinent past surgical history.  Gynecologic History G1P1001  No LMP recorded. Contraception: oral progesterone-only contraceptive Last Pap: 2015. Results were: normal  Obstetric History OB History  Gravida Para Term Preterm AB SAB TAB Ectopic Multiple Living  1 1 1       0 1    # Outcome Date GA Lbr Len/2nd Weight Sex Delivery Anes PTL Lv  1 Term 07/02/15 [redacted]w[redacted]d 05:50 / 00:14 6 lb 6.7 oz (2.91 kg) F Vag-Spont EPI  Y      Current Medications Current Outpatient Prescriptions on File Prior to Visit  Medication Sig Dispense Refill  . norethindrone (ORTHO MICRONOR) 0.35 MG tablet Take 1 tablet (0.35 mg total) by mouth daily. 1 Package 11  . Prenatal Vit-Fe Fumarate-FA (PRENATAL MULTIVITAMIN) TABS tablet Take 1 tablet by mouth daily at 12 noon.    Marland Kitchen acyclovir (ZOVIRAX) 400 MG tablet Take 1 tablet (400 mg total) by mouth 2 (two) times daily. (Patient not taking: Reported on 11/08/2015) 60 tablet 6  . Cholecalciferol (VITAMIN D)  2000 UNITS tablet Take 1 tablet (2,000 Units total) by mouth daily. (Patient not taking: Reported on 11/08/2015) 120 tablet 4  . docusate sodium (COLACE) 100 MG capsule Take 100 mg by mouth daily.    . fluticasone (FLONASE) 50 MCG/ACT nasal spray Place into both nostrils daily. Reported on 11/08/2015    . Iron-FA-B Cmp-C-Biot-Probiotic (FUSION PLUS) CAPS Take 1 capsule by mouth daily. (Patient not taking: Reported on 11/08/2015) 60 capsule 1   No current facility-administered medications on file prior to visit.    Review of Systems Patient denies any headaches, blurred vision, shortness of breath, chest pain, abdominal pain, problems with bowel movements, urination, or intercourse.  Objective:  BP 121/92 mmHg  Pulse 98  Ht 5\' 5"  (1.651 m)  Wt 152 lb 6.4 oz (69.128 kg)  BMI 25.36 kg/m2  Breastfeeding? Yes Physical Exam  General:  Well developed, well nourished, no acute distress. She is alert and oriented x3. Skin:  Warm and dry Neck:  Midline trachea, no thyromegaly or nodules Cardiovascular: Regular rate and rhythm, no murmur heard Lungs:  Effort normal, all lung fields clear to auscultation bilaterally Breasts:  No dominant palpable mass, retraction, or nipple discharge Abdomen:  Soft, non tender, no hepatosplenomegaly or masses Pelvic:  External genitalia is normal in appearance.  The vagina is normal in appearance. The cervix is bulbous, no CMT.  Thin prep pap is done without HR HPV cotesting. Uterus is felt to be normal size, shape, and contour.  No adnexal masses or tenderness noted.  Extremities:  No swelling or varicosities noted Psych:  She has a normal mood and affect  Assessment:   Healthy well-woman exam Amenorrhea secondary to lactation' Decreased milk supply PP hypertension  Plan:  Labs and pap obtained Changed BP meds RX for reglan tid x 10-14days given to boost milk supply F/U 1 year for AE, or sooner if needed   Melody Rockney Ghee, CNM

## 2015-11-08 NOTE — Patient Instructions (Signed)
  Place annual gynecologic exam patient instructions here.  Thank you for enrolling in [facility]. Please follow the instructions below to securely access your online medical record. MyChart allows you to send messages to your doctor, view your test results, manage appointments, and more.   How Do I Sign Up? 1. In your Internet browser, go to [employer] and enter [email or web address]. 2. Click on the Sign Up Now link in the Sign In box. You will see the New Member Sign Up page. 3. Enter your MyChart Access Code exactly as it appears below. You will not need to use this code after you've completed the sign-up process. If you do not sign up before the expiration date, you must request a new code.  MyChart Access Code: [id number] Expires: 01/07/2016  2:48 PM  4. Enter your Social Security Number ([id number]) and Date of Birth (mm/dd/yyyy) as indicated and click Submit. You will be taken to the next sign-up page. 5. Create a MyChart ID. This will be your MyChart login ID and cannot be changed, so think of one that is secure and easy to remember. 6. Create a MyChart password. You can change your password at any time. 7. Enter your Password Reset Question and Answer. This can be used at a later time if you forget your password.  8. Enter your e-mail address. You will receive e-mail notification when new information is available in [place]. 9. Click Sign Up. You can now view your medical record.   Additional Information Remember, MyChart is NOT to be used for urgent needs. For medical emergencies, dial 911.

## 2015-11-12 LAB — CYTOLOGY - PAP

## 2015-11-21 ENCOUNTER — Encounter: Payer: Self-pay | Admitting: Obstetrics and Gynecology

## 2015-11-27 ENCOUNTER — Ambulatory Visit (INDEPENDENT_AMBULATORY_CARE_PROVIDER_SITE_OTHER): Payer: Medicaid Other | Admitting: Obstetrics and Gynecology

## 2015-11-27 ENCOUNTER — Encounter: Payer: Self-pay | Admitting: Obstetrics and Gynecology

## 2015-11-27 VITALS — BP 122/90 | HR 76 | Ht 65.0 in | Wt 156.6 lb

## 2015-11-27 DIAGNOSIS — O99345 Other mental disorders complicating the puerperium: Principal | ICD-10-CM

## 2015-11-27 DIAGNOSIS — F53 Postpartum depression: Secondary | ICD-10-CM

## 2015-11-27 DIAGNOSIS — Z3040 Encounter for surveillance of contraceptives, unspecified: Secondary | ICD-10-CM

## 2015-11-27 MED ORDER — ESCITALOPRAM OXALATE 10 MG PO TABS: 10.0000 mg | ORAL_TABLET | Freq: Every day | ORAL | Status: DC

## 2015-11-27 MED ORDER — MEDROXYPROGESTERONE ACETATE 150 MG/ML IM SUSP: 150.0000 mg | INTRAMUSCULAR | Status: DC

## 2015-11-27 NOTE — Progress Notes (Signed)
Subjective:     Patient ID: Sonya Bauer, female   DOB: [birth year], 25 y.o.   MRN: [id number]  HPI Worsening depression since delivery of infant 5 months ago, desires medication. Stopped trying to breast feed last week due to lack of milk production.  Review of Systems See above. Depression screen [facility] 11/27/2015 08/08/2015  Decreased Interest 2 0  Down, Depressed, Hopeless 3 0  PHQ - 2 Score 5 0  Altered sleeping 2 -  Tired, decreased energy 2 -  Change in appetite 0 -  Feeling bad or failure about yourself  3 -  Trouble concentrating 1 -  Moving slowly or fidgety/restless 1 -  Suicidal thoughts 0 -  PHQ-9 Score 14 -  Difficult doing work/chores Somewhat difficult -       Objective:   Physical Exam A&O x4  well groomed female in no distress Depressed affect Blood pressure 122/90, pulse 76, height 5\' 5"  (1.651 m), weight 156 lb 9.6 oz (71.033 kg), not currently breastfeeding.    Assessment:     PPD Contraception counseling  Hypertension    Plan:     Start lexapro 10mg  qam Switch to Depo- to return with meds for injection next week Continue current BP meds  To let me know via MyChart how she is feeling in 4 weeks or prn.  Melody [place], CNM

## 2015-11-27 NOTE — Patient Instructions (Signed)
Postpartum Depression and Baby Blues °The postpartum period begins right after the birth of a baby. During this time, there is often a great amount of joy and excitement. It is also a time of many changes in the life of the parents. Regardless of how many times a mother gives birth, each child brings new challenges and dynamics to the family. It is not unusual to have feelings of excitement along with confusing shifts in moods, emotions, and thoughts. All mothers are at risk of developing postpartum depression or the "baby blues." These mood changes can occur right after giving birth, or they may occur many months after giving birth. The baby blues or postpartum depression can be mild or severe. Additionally, postpartum depression can go away rather quickly, or it can be a long-term condition.  °CAUSES °Raised hormone levels and the rapid drop in those levels are thought to be a main cause of postpartum depression and the baby blues. A number of hormones change during and after pregnancy. Estrogen and progesterone usually decrease right after the delivery of your baby. The levels of thyroid hormone and various cortisol steroids also rapidly drop. Other factors that play a role in these mood changes include major life events and genetics.  °RISK FACTORS °If you have any of the following risks for the baby blues or postpartum depression, know what symptoms to watch out for during the postpartum period. Risk factors that may increase the likelihood of getting the baby blues or postpartum depression include: °· Having a personal or family history of depression.   °· Having depression while being pregnant.   °· Having premenstrual mood issues or mood issues related to oral contraceptives. °· Having a lot of life stress.   °· Having marital conflict.   °· Lacking a social support network.   °· Having a baby with special needs.   °· Having health problems, such as diabetes.   °SIGNS AND SYMPTOMS °Symptoms of baby blues  include: °· Brief changes in mood, such as going from extreme happiness to sadness. °· Decreased concentration.   °· Difficulty sleeping.   °· Crying spells, tearfulness.   °· Irritability.   °· Anxiety.   °Symptoms of postpartum depression typically begin within the first month after giving birth. These symptoms include: °· Difficulty sleeping or excessive sleepiness.   °· Marked weight loss.   °· Agitation.   °· Feelings of worthlessness.   °· Lack of interest in activity or food.   °Postpartum psychosis is a very serious condition and can be dangerous. Fortunately, it is rare. Displaying any of the following symptoms is cause for immediate medical attention. Symptoms of postpartum psychosis include:  °· Hallucinations and delusions.   °· Bizarre or disorganized behavior.   °· Confusion or disorientation.   °DIAGNOSIS  °A diagnosis is made by an evaluation of your symptoms. There are no medical or lab tests that lead to a diagnosis, but there are various questionnaires that a health care provider may use to identify those with the baby blues, postpartum depression, or psychosis. Often, a screening tool called the Edinburgh Postnatal Depression Scale is used to diagnose depression in the postpartum period.  °TREATMENT °The baby blues usually goes away on its own in 1-2 weeks. Social support is often all that is needed. You will be encouraged to get adequate sleep and rest. Occasionally, you may be given medicines to help you sleep.  °Postpartum depression requires treatment because it can last several months or longer if it is not treated. Treatment may include individual or group therapy, medicine, or both to address any social, physiological, and psychological   factors that may play a role in the depression. Regular exercise, a healthy diet, rest, and social support may also be strongly recommended.  °Postpartum psychosis is more serious and needs treatment right away. Hospitalization is often needed. °HOME CARE  INSTRUCTIONS °· Get as much rest as you can. Nap when the baby sleeps.   °· Exercise regularly. Some women find yoga and walking to be beneficial.   °· Eat a balanced and nourishing diet.   °· Do little things that you enjoy. Have a cup of tea, take a bubble bath, read your favorite magazine, or listen to your favorite music. °· Avoid alcohol.   °· Ask for help with household chores, cooking, grocery shopping, or running errands as needed. Do not try to do everything.   °· Talk to people close to you about how you are feeling. Get support from your partner, family members, friends, or other new moms. °· Try to stay positive in how you think. Think about the things you are grateful for.   °· Do not spend a lot of time alone.   °· Only take over-the-counter or prescription medicine as directed by your health care provider. °· Keep all your postpartum appointments.   °· Let your health care provider know if you have any concerns.   °SEEK MEDICAL CARE IF: °You are having a reaction to or problems with your medicine. °SEEK IMMEDIATE MEDICAL CARE IF: °· You have suicidal feelings.   °· You think you may harm the baby or someone else. °MAKE SURE YOU: °· Understand these instructions. °· Will watch your condition. °· Will get help right away if you are not doing well or get worse. °  °This information is not intended to replace advice given to you by your health care provider. Make sure you discuss any questions you have with your health care provider. °  °Document Released: 07/02/2004 Document Revised: 10/03/2013 Document Reviewed: 07/10/2013 °Elsevier Interactive Patient Education ©2016 [employer] ° °

## 2015-12-02 ENCOUNTER — Encounter: Payer: Self-pay | Admitting: Obstetrics and Gynecology

## 2015-12-13 ENCOUNTER — Other Ambulatory Visit: Payer: Self-pay | Admitting: Obstetrics and Gynecology

## 2015-12-13 ENCOUNTER — Ambulatory Visit (INDEPENDENT_AMBULATORY_CARE_PROVIDER_SITE_OTHER): Payer: Medicaid Other | Admitting: Obstetrics and Gynecology

## 2015-12-13 VITALS — BP 128/90 | HR 80 | Wt 156.1 lb

## 2015-12-13 DIAGNOSIS — Z30013 Encounter for initial prescription of injectable contraceptive: Secondary | ICD-10-CM | POA: Diagnosis not present

## 2015-12-13 MED ORDER — FLUOXETINE HCL 10 MG PO CAPS: 10.0000 mg | ORAL_CAPSULE | Freq: Every day | ORAL | Status: DC

## 2015-12-13 MED ORDER — MEDROXYPROGESTERONE ACETATE 150 MG/ML IM SUSP
150.0000 mg | Freq: Once | INTRAMUSCULAR | Status: AC
  Administered 2015-12-13: 150 mg via INTRAMUSCULAR

## 2015-12-13 NOTE — Progress Notes (Signed)
Pt is here for her depo provera inj

## 2016-02-04 ENCOUNTER — Encounter: Payer: Self-pay | Admitting: Obstetrics and Gynecology

## 2016-02-14 ENCOUNTER — Other Ambulatory Visit: Payer: Self-pay | Admitting: Obstetrics and Gynecology

## 2016-04-02 ENCOUNTER — Emergency Department
Admission: EM | Admit: 2016-04-02 | Discharge: 2016-04-02 | Disposition: A | Discharge status: Home / Self Care | Payer: Medicaid Other | Attending: Emergency Medicine | Admitting: Emergency Medicine

## 2016-04-02 ENCOUNTER — Emergency Department: Payer: Medicaid Other

## 2016-04-02 ENCOUNTER — Telehealth: Payer: Self-pay | Admitting: *Deleted

## 2016-04-02 ENCOUNTER — Encounter: Payer: Self-pay | Admitting: Emergency Medicine

## 2016-04-02 DIAGNOSIS — N200 Calculus of kidney: Secondary | ICD-10-CM | POA: Diagnosis not present

## 2016-04-02 DIAGNOSIS — I10 Essential (primary) hypertension: Secondary | ICD-10-CM | POA: Diagnosis not present

## 2016-04-02 DIAGNOSIS — Z79899 Other long term (current) drug therapy: Secondary | ICD-10-CM | POA: Diagnosis not present

## 2016-04-02 DIAGNOSIS — R109 Unspecified abdominal pain: Secondary | ICD-10-CM | POA: Diagnosis present

## 2016-04-02 LAB — COMPREHENSIVE METABOLIC PANEL
ALK PHOS: 89 U/L (ref 38–126)
ALT: 18 U/L (ref 14–54)
AST: 18 U/L (ref 15–41)
Albumin: 4.9 g/dL (ref 3.5–5.0)
Anion gap: 9 (ref 5–15)
BUN: 15 mg/dL (ref 6–20)
CO2: 24 mmol/L (ref 22–32)
Calcium: 9.5 mg/dL (ref 8.9–10.3)
Chloride: 106 mmol/L (ref 101–111)
Creatinine, Ser: 0.71 mg/dL (ref 0.44–1.00)
GLUCOSE: 95 mg/dL (ref 65–99)
Potassium: 3.9 mmol/L (ref 3.5–5.1)
SODIUM: 139 mmol/L (ref 135–145)
TOTAL PROTEIN: 7.7 g/dL (ref 6.5–8.1)
Total Bilirubin: 0.6 mg/dL (ref 0.3–1.2)

## 2016-04-02 LAB — CBC
HEMATOCRIT: 40.3 % (ref 35.0–47.0)
HEMOGLOBIN: 14.1 g/dL (ref 12.0–16.0)
MCH: 28.9 pg (ref 26.0–34.0)
MCHC: 35.1 g/dL (ref 32.0–36.0)
MCV: 82.4 fL (ref 80.0–100.0)
Platelets: 314 10*3/uL (ref 150–440)
RBC: 4.89 MIL/uL (ref 3.80–5.20)
RDW: 13 % (ref 11.5–14.5)
WBC: 6.3 10*3/uL (ref 3.6–11.0)

## 2016-04-02 LAB — URINALYSIS COMPLETE WITH MICROSCOPIC (ARMC ONLY)
BILIRUBIN URINE: NEGATIVE
GLUCOSE, UA: NEGATIVE mg/dL
KETONES UR: NEGATIVE mg/dL
Leukocytes, UA: NEGATIVE
NITRITE: NEGATIVE
Protein, ur: 100 mg/dL — AB
SPECIFIC GRAVITY, URINE: 1.025 (ref 1.005–1.030)
pH: 5 (ref 5.0–8.0)

## 2016-04-02 LAB — POCT PREGNANCY, URINE: Preg Test, Ur: NEGATIVE

## 2016-04-02 LAB — LIPASE, BLOOD: Lipase: 22 U/L (ref 11–51)

## 2016-04-02 MED ORDER — CEPHALEXIN 500 MG PO CAPS: 500.0000 mg | ORAL_CAPSULE | Freq: Three times a day (TID) | ORAL | Status: DC

## 2016-04-02 MED ORDER — OXYCODONE-ACETAMINOPHEN 5-325 MG PO TABS: 1.0000 | ORAL_TABLET | Freq: Four times a day (QID) | ORAL | Status: DC | PRN

## 2016-04-02 MED ORDER — TAMSULOSIN HCL 0.4 MG PO CAPS: 0.4000 mg | ORAL_CAPSULE | Freq: Every day | ORAL | Status: DC

## 2016-04-02 NOTE — ED Notes (Signed)
Pt returned from CT scanner

## 2016-04-02 NOTE — ED Notes (Addendum)
Pt in via triage with complaints of worsening intermittent lower abdominal pain radiating through to back on the left side x 1 month.  Pt reports nausea/vomiting x 1 day.  Pt reports blood in urine since earlier this month.  Pt denies any urine urgency, frequency.  Pt denies any pain at this time.  Pt A/Ox4, no immediate distress at this time.

## 2016-04-02 NOTE — Discharge Instructions (Signed)
Please call Dr. Erlene Quan for a follow-up appointments as possible. Please take your antibiotic as prescribed, pain medication as needed. Return to the emergency department for any pain while urinating, fever, or increased abdominal pain.    Kidney Stones Kidney stones (urolithiasis) are deposits that form inside your kidneys. The intense pain is caused by the stone moving through the urinary tract. When the stone moves, the ureter goes into spasm around the stone. The stone is usually passed in the urine.  CAUSES   A disorder that makes certain neck glands produce too much parathyroid hormone (primary hyperparathyroidism).  A buildup of uric acid crystals, similar to gout in your joints.  Narrowing (stricture) of the ureter.  A kidney obstruction present at birth (congenital obstruction).  Previous surgery on the kidney or ureters.  Numerous kidney infections. SYMPTOMS   Feeling sick to your stomach (nauseous).  Throwing up (vomiting).  Blood in the urine (hematuria).  Pain that usually spreads (radiates) to the groin.  Frequency or urgency of urination. DIAGNOSIS   Taking a history and physical exam.  Blood or urine tests.  CT scan.  Occasionally, an examination of the inside of the urinary bladder (cystoscopy) is performed. TREATMENT   Observation.  Increasing your fluid intake.  Extracorporeal shock wave lithotripsy--This is a noninvasive procedure that uses shock waves to break up kidney stones.  Surgery may be needed if you have severe pain or persistent obstruction. There are various surgical procedures. Most of the procedures are performed with the use of small instruments. Only small incisions are needed to accommodate these instruments, so recovery time is minimized. The size, location, and chemical composition are all important variables that will determine the proper choice of action for you. Talk to your health care provider to better understand your  situation so that you will minimize the risk of injury to yourself and your kidney.  HOME CARE INSTRUCTIONS   Drink enough water and fluids to keep your urine clear or pale yellow. This will help you to pass the stone or stone fragments.  Strain all urine through the provided strainer. Keep all particulate matter and stones for your health care provider to see. The stone causing the pain may be as small as a grain of salt. It is very important to use the strainer each and every time you pass your urine. The collection of your stone will allow your health care provider to analyze it and verify that a stone has actually passed. The stone analysis will often identify what you can do to reduce the incidence of recurrences.  Only take over-the-counter or prescription medicines for pain, discomfort, or fever as directed by your health care provider.  Keep all follow-up visits as told by your health care provider. This is important.  Get follow-up X-rays if required. The absence of pain does not always mean that the stone has passed. It may have only stopped moving. If the urine remains completely obstructed, it can cause loss of kidney function or even complete destruction of the kidney. It is your responsibility to make sure X-rays and follow-ups are completed. Ultrasounds of the kidney can show blockages and the status of the kidney. Ultrasounds are not associated with any radiation and can be performed easily in a matter of minutes.  Make changes to your daily diet as told by your health care provider. You may be told to:  Limit the amount of salt that you eat.  Eat 5 or more servings of fruits and  vegetables each day.  Limit the amount of meat, poultry, fish, and eggs that you eat.  Collect a 24-hour urine sample as told by your health care provider.You may need to collect another urine sample every 6-12 months. SEEK MEDICAL CARE IF:  You experience pain that is progressive and unresponsive to  any pain medicine you have been prescribed. SEEK IMMEDIATE MEDICAL CARE IF:   Pain cannot be controlled with the prescribed medicine.  You have a fever or shaking chills.  The severity or intensity of pain increases over 18 hours and is not relieved by pain medicine.  You develop a new onset of abdominal pain.  You feel faint or pass out.  You are unable to urinate.   This information is not intended to replace advice given to you by your health care provider. Make sure you discuss any questions you have with your health care provider.   Document Released: 09/28/2005 Document Revised: 06/19/2015 Document Reviewed: 03/01/2013 Elsevier Interactive Patient Education [employer].

## 2016-04-02 NOTE — ED Provider Notes (Signed)
[facility] Emergency Department Provider Note  Time seen: 11:38 AM  I have reviewed the triage vital signs and the nursing notes.   HISTORY  Chief Complaint Flank Pain and Abdominal Pain    HPI Sonya Bauer is a 25 y.o. female with a past medical history of hypertension, kidney stone, who presents the emergency department with lower abdominal pain and left flank pain. According to the patient for the past one month she has been having intermittent left flank pain and lower abdominal pain. Denies dysuria, hematuria, vaginal bleeding or discharge. Denies nausea, vomiting. She does state intermittent diarrhea over the past 2 weeks. Denies fever. Patient describes the abdominal pain as a 6/10 when severe, currently a 2/10. Dull aching pain.     Past Medical History  Diagnosis Date  . Headache   . Hypertension   . 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 Pregnancy induced hypertension     Patient Active Problem List   Diagnosis Date Noted  . Decreased milk production 11/08/2015  . Vaginal delivery 07/03/2015  . Preeclampsia 07/01/2015  . Pregnancy 06/13/2015  . 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 Elevated BP 05/08/2015    History reviewed. No pertinent past surgical history.  Current Outpatient Rx  Name  Route  Sig  Dispense  Refill  . acyclovir (ZOVIRAX) 400 MG tablet   Oral   Take 1 tablet (400 mg total) by mouth 2 (two) times daily. Patient not taking: Reported on 11/08/2015   60 tablet   6   . Cholecalciferol (VITAMIN D) 2000 UNITS tablet   Oral   Take 1 tablet (2,000 Units total) by mouth daily. Patient not taking: Reported on 11/08/2015   120 tablet   4   . docusate sodium (COLACE) 100 MG capsule   Oral   Take 100 mg by mouth daily. Reported on 11/27/2015         . FLUoxetine (PROZAC) 10 MG capsule   Oral   Take 1 capsule (10 mg total) by mouth daily. To stop Lexapro, and start 1 pill daily x 2 weeks, then increase to 2 pills daily.   60 capsule   3    . fluticasone (FLONASE) 50 MCG/ACT nasal spray   Each Nare   Place into both nostrils daily. Reported on 12/13/2015         . medroxyPROGESTERone (DEPO-PROVERA) 150 MG/ML injection   Intramuscular   Inject 1 mL (150 mg total) into the muscle every 3 (three) months.   1 mL   0   . metoCLOPramide (REGLAN) 10 MG tablet   Oral   Take 1 tablet (10 mg total) by mouth 3 (three) times daily before meals. Patient not taking: Reported on 11/27/2015   42 tablet   2   . NIFEDICAL XL 30 MG 24 hr tablet      TAKE 1 TABLET BY MOUTH DAILY. CAN INCREASE TO TWICE DAILY AS NEEDED FOR SYMPTOMATIC CONTRACTIONS.   90 tablet   2     **Patient requests 90 days supply**   . Prenatal Vit-Fe Fumarate-FA (PRENATAL MULTIVITAMIN) TABS tablet   Oral   Take 1 tablet by mouth daily at 12 noon. Reported on 12/13/2015           Allergies Review of patient's allergies indicates no known allergies.  Family History  Problem Relation Age of Onset  . Hypertension Father     Social History Social History  Substance Use Topics  . Smoking status: Never Smoker   .  Smokeless tobacco: Never Used  . Alcohol Use: No    Review of Systems Constitutional: Negative for fever. Cardiovascular: Negative for chest pain. Respiratory: Negative for shortness of breath. Gastrointestinal:Left flank pain, lower abdominal pain. Genitourinary: Negative for dysuria. Neurological: Negative for headache 10-point ROS otherwise negative.  ____________________________________________   PHYSICAL EXAM:  VITAL SIGNS: ED Triage Vitals  Enc Vitals Group     BP 04/02/16 0959 153/107 mmHg     Pulse Rate 04/02/16 0959 86     Resp 04/02/16 0959 20     Temp 04/02/16 0959 98.5 F (36.9 C)     Temp Source 04/02/16 0959 Oral     SpO2 04/02/16 0959 97 %     Weight 04/02/16 0959 156 lb (70.761 kg)     Height 04/02/16 0959 5\' 5"  (1.651 m)     Head Cir --      Peak Flow --      Pain Score 04/02/16 1000 5     Pain Loc --       Pain Edu? --      Excl. in [place]? --     Constitutional: Alert and oriented. Well appearing and in no distress. Eyes: Normal exam ENT   Head: Normocephalic and atraumatic.   Mouth/Throat: Mucous membranes are moist. Cardiovascular: Normal rate, regular rhythm. No murmur Respiratory: Normal respiratory effort without tachypnea nor retractions. Breath sounds are clear Gastrointestinal: Soft, mild suprapubic tenderness to palpation. No rebound or guarding. No distention. No CVA tenderness. Musculoskeletal: Nontender with normal range of motion in all extremities. Neurologic:  Normal speech and language. No gross focal neurologic deficits  Skin:  Skin is warm, dry and intact.  Psychiatric: Mood and affect are normal.   ____________________________________________   RADIOLOGY  CT consistent with 3 mm left UPJ stone.  ____________________________________________    INITIAL IMPRESSION / ASSESSMENT AND PLAN / ED COURSE  Pertinent labs & imaging results that were available during my care of the patient were reviewed by me and considered in my medical decision making (see chart for details).  The patient presents the emergency department lower abdominal pain and left flank pain intermittently for the past one month. Patient's labs are largely within normal limits besides bacteria and red blood cells in the patient's urinalysis. Patient denies any vaginal bleeding or spotting at this time. The mother denies any vaginal discharge. Does not feel she is at risk for STDs. Patient states a history of a kidney stone once in the past, states this feels somewhat similar but less severe. Given the red blood cells in the urine without vaginal bleeding we will continue with a CT to rule out ureterolithiasis.   3 mm left UPJ stone. We'll discharge on Flomax, Percocet as needed, we'll cover with Keflex given bacteria in the urine, and send a urine culture. I discussed this with the patient who is  agreeable. She will follow-up with Dr. Erin Sons  ____________________________________________   FINAL CLINICAL IMPRESSION(S) / ED DIAGNOSES  Left flank pain Kidney stones   Harvest Dark, MD 04/02/16 1259

## 2016-04-02 NOTE — Telephone Encounter (Signed)
fyi

## 2016-04-02 NOTE — ED Notes (Signed)
Pt to ed with c/o severe abd cramping x 2 months.  Pt states early in June had bloody urine and pain.  Pt states abd pain cramping continues.

## 2016-04-02 NOTE — Telephone Encounter (Signed)
Patient called and stated that she came in the office and talked to Shirlean Mylar about her symptoms. Patient wanted to let Amy know about her symptoms. Patient stated that she was told if her symptoms was bad to go to the ER. She went to the ER and she was diagnosed with Kidney stones. She wanted to make Melody aware. patietn has a f/u appt next week. Thanks

## 2016-04-03 LAB — URINE CULTURE

## 2016-04-09 ENCOUNTER — Ambulatory Visit: Payer: Medicaid Other | Admitting: Obstetrics and Gynecology

## 2016-04-30 ENCOUNTER — Ambulatory Visit (INDEPENDENT_AMBULATORY_CARE_PROVIDER_SITE_OTHER): Payer: Medicaid Other | Admitting: Obstetrics and Gynecology

## 2016-04-30 ENCOUNTER — Encounter: Payer: Self-pay | Admitting: Obstetrics and Gynecology

## 2016-04-30 VITALS — BP 148/98 | HR 100 | Ht 65.0 in | Wt 164.4 lb

## 2016-04-30 DIAGNOSIS — Z30430 Encounter for insertion of intrauterine contraceptive device: Secondary | ICD-10-CM

## 2016-04-30 MED ORDER — NIFEDIPINE ER OSMOTIC RELEASE 30 MG PO TB24
ORAL_TABLET | ORAL | Status: DC
Start: 2016-04-30 — End: 2016-07-30

## 2016-04-30 MED ORDER — LEVONORGESTREL 20 MCG/24HR IU IUD: 1.0000 | INTRAUTERINE_SYSTEM | Freq: Once | INTRAUTERINE | Status: DC

## 2016-04-30 NOTE — Patient Instructions (Signed)

## 2016-04-30 NOTE — Progress Notes (Signed)
Sonya Bauer is a 25 y.o. year old [id number] Caucasian female who presents for placement of a Mirena IUD.  Patient's last menstrual period was 04/24/2016. BP 148/98 mmHg  Pulse 100  Ht 5\' 5"  (1.651 m)  Wt 164 lb 6.4 oz (74.571 kg)  BMI 27.36 kg/m2  LMP 04/24/2016  Breastfeeding? No Last sexual intercourse was weeks ago, and pregnancy test today was negative  The risks and benefits of the method and placement have been thouroughly reviewed with the patient and all questions were answered.  Specifically the patient is aware of failure rate of 10/998, expulsion of the IUD and of possible perforation.  The patient is aware of irregular bleeding due to the method and understands the incidence of irregular bleeding diminishes with time.  Signed copy of informed consent in chart.   Time out was performed.  A graves speculum was placed in the vagina.  The cervix was visualized, prepped using Betadine, and grasped with a single tooth tenaculum. The uterus was found to be neutral and it sounded to 8 cm.  Mirena IUD placed per manufacturer's recommendations.   The strings were trimmed to 3 cm.   The patient was given post procedure instructions, including signs and symptoms of infection and to check for the strings after each menses or each month, and refraining from intercourse or anything in the vagina for 3 days.  She was given a Mirena care card with date Mirena placed, and date Mirena to be removed.    Melody Rockney Ghee, CNM

## 2016-05-04 ENCOUNTER — Telehealth: Payer: Self-pay

## 2016-05-04 NOTE — Telephone Encounter (Signed)
This pt was given a BP medication, also saw ER for Kidney Stone and they put her on Tamsulosin. HJer BP is 140/99 and pulse is 121. She is thinking she needs higher dosage of BP med or is the med the ER give her causing it to be higher. She sees a kidney specialist this week. Please call pt.

## 2016-05-04 NOTE — Telephone Encounter (Signed)
Restarted B/P on Thursday. Pt thinks she has another kidney stone. Noted blood in urine. B/P remains elevated. Now is complaining of heart racing, and vomiting. When asked if she was having shortness of breath or chest pain, pt stated " a little ". Pt states she was going to wait and see if B/P comes down and if not go to ER. I did strongly recommend that she got to ER now. Pt does see Dr. Larene Beach at [facility] on Wednesday.

## 2016-05-05 ENCOUNTER — Encounter: Payer: Self-pay | Admitting: Obstetrics and Gynecology

## 2016-05-05 NOTE — Telephone Encounter (Signed)
Agree with recommendation

## 2016-05-05 NOTE — Telephone Encounter (Signed)
Pt went to [facility] ER

## 2016-05-06 ENCOUNTER — Encounter: Payer: Self-pay | Admitting: Obstetrics and Gynecology

## 2016-05-06 ENCOUNTER — Other Ambulatory Visit (INDEPENDENT_AMBULATORY_CARE_PROVIDER_SITE_OTHER): Payer: Medicaid Other

## 2016-05-06 ENCOUNTER — Ambulatory Visit (INDEPENDENT_AMBULATORY_CARE_PROVIDER_SITE_OTHER): Payer: Medicaid Other | Admitting: Obstetrics and Gynecology

## 2016-05-06 ENCOUNTER — Encounter: Payer: Self-pay | Admitting: Urology

## 2016-05-06 ENCOUNTER — Ambulatory Visit (INDEPENDENT_AMBULATORY_CARE_PROVIDER_SITE_OTHER): Payer: Medicaid Other | Admitting: Urology

## 2016-05-06 VITALS — Ht 65.0 in | Wt 164.6 lb

## 2016-05-06 VITALS — BP 128/92

## 2016-05-06 DIAGNOSIS — R310 Gross hematuria: Secondary | ICD-10-CM | POA: Diagnosis not present

## 2016-05-06 DIAGNOSIS — R1030 Lower abdominal pain, unspecified: Secondary | ICD-10-CM

## 2016-05-06 DIAGNOSIS — N201 Calculus of ureter: Secondary | ICD-10-CM | POA: Diagnosis not present

## 2016-05-06 DIAGNOSIS — N132 Hydronephrosis with renal and ureteral calculous obstruction: Secondary | ICD-10-CM

## 2016-05-06 LAB — URINALYSIS, COMPLETE
Bilirubin, UA: NEGATIVE
GLUCOSE, UA: NEGATIVE
KETONES UA: NEGATIVE
LEUKOCYTES UA: NEGATIVE
Nitrite, UA: NEGATIVE
Protein, UA: NEGATIVE
SPEC GRAV UA: 1.025 (ref 1.005–1.030)
Urobilinogen, Ur: 0.2 mg/dL (ref 0.2–1.0)
pH, UA: 6 (ref 5.0–7.5)

## 2016-05-06 LAB — MICROSCOPIC EXAMINATION: RBC, UA: >30 /hpf — AB (ref 0–?)

## 2016-05-06 NOTE — Progress Notes (Signed)
05/06/2016 2:13 PM   Sonya Bauer [birth year] [id number]  Referring provider: No referring provider defined for this encounter.  Chief Complaint  Patient presents with  . Nephrolithiasis    referred by ER  also had kidney stone from 7 years ago in ER    HPI: Patient is a 25 year old Caucasian female who is referred by [facility] ED for nephrolithiasis.  Patient states the onset of the pain was two months ago.   It was sharp.  It lasted for several minutes.  The pain was located in the left hip and radiated to the left flank.  The pain was a 6/10.  Nothing made the pain better.   Nothing made the pain worse.  She did have or did not have gross hematuria, nausea or vomiting.  UA in the ED noted TNTC RBC's/hpf .  Serum creatinine was 0.71.  She was given narcotic pain meds and tamsulosin.  CT renal stone study completed on 04/02/2016 noted left ureteropelvic junction calculus measuring 3 mm.  Enlargement of left kidney and mild left hydronephrosis.  Normal appendix.  Small nonspecific right lower quadrant mesenteric lymph nodes.  Minimal bibasilar atelectasis.  She experienced tachycardia after she started the tamsulosin, so she discontinued the medication.    She then was seen in at [facility] yesterday and CT scan at that time demonstrated that the stone remains in the left UVJ.  They also told her that her IUD may be in the wrong position.  She has an appointment with Mrs. Shambley, CNM this afternoon.    Today, she is still having lower abdominal pain that radiates to the left side.  She is uncertain if this pain is due to the IUD or ureteral stone.  UA today demonstrates > 30 RBC's/hpf.  She is not experiencing fevers, chills, nausea or vomiting at this time.    She does have a prior history of stones.  Stone composition at this time is unknown.         PMH: Past Medical History:  Diagnosis Date  . Depression   . Headache   . Heartburn   . Hypertension   . Pregnancy induced  hypertension   . 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 No pertinent surgical history.  Home Medications:    Medication List       Accurate as of 05/06/16  2:13 PM. Always use your most recent med list.          acetaminophen 500 MG tablet Commonly known as:  TYLENOL Take 1,000 mg by mouth every 4 (four) hours.   acyclovir 200 MG capsule Commonly known as:  ZOVIRAX Take 200 mg by mouth every 4 (four) hours.   cephALEXin 500 MG capsule Commonly known as:  KEFLEX TK ONE C PO TID   FLUoxetine 10 MG capsule Commonly known as:  PROZAC Take 1 capsule (10 mg total) by mouth daily. To stop Lexapro, and start 1 pill daily x 2 weeks, then increase to 2 pills daily.   fluticasone 50 MCG/ACT nasal spray Commonly known as:  FLONASE 1 spray by Nasal route daily. Spray in each nostril.   ibuprofen 400 MG tablet Commonly known as:  ADVIL,MOTRIN Take 400 mg by mouth.   IRON PO Take 1 tablet by mouth daily.   levonorgestrel 20 MCG/24HR IUD Commonly known as:  MIRENA 1 Intra Uterine Device (1 each total) by Intrauterine route once.   NIFEdipine 30 MG 24 hr tablet Commonly  known as:  NIFEDICAL XL TAKE 1 TABLET BY MOUTH DAILY. CAN INCREASE TO TWICE DAILY AS NEEDED FOR SYMPTOMATIC CONTRACTIONS.   ondansetron 4 MG disintegrating tablet Commonly known as:  ZOFRAN-ODT Take 4 mg by mouth.   oxyCODONE-acetaminophen 5-325 MG tablet Commonly known as:  PERCOCET/ROXICET TK 1 T PO Q 6 H PRN   sodium chloride 0.65 % nasal spray Commonly known as:  OCEAN 1 drop by Each Nare route as needed.   tamsulosin 0.4 MG Caps capsule Commonly known as:  FLOMAX Take 0.4 mg by mouth.       Allergies: No Known Allergies  Family History: Family History  Problem Relation Age of Onset  . Hypertension Father   . Kidney disease Neg Hx   . Bladder Cancer Neg Hx     Social History:  reports that she has never smoked. She has never used smokeless tobacco. She reports that  she does not drink alcohol or use drugs.  ROS: UROLOGY Frequent Urination?: Yes Hard to postpone urination?: No Burning/pain with urination?: No Get up at night to urinate?: Yes Leakage of urine?: No Urine stream starts and stops?: No Trouble starting stream?: No Do you have to strain to urinate?: No Blood in urine?: Yes Urinary tract infection?: No Sexually transmitted disease?: No Injury to kidneys or bladder?: No Painful intercourse?: Yes Weak stream?: No Currently pregnant?: No Vaginal bleeding?: No Last menstrual period?: n  Gastrointestinal Nausea?: Yes Vomiting?: Yes Indigestion/heartburn?: Yes Diarrhea?: No Constipation?: Yes  Constitutional Fever: No Night sweats?: No Weight loss?: No Fatigue?: Yes  Skin Skin rash/lesions?: No Itching?: No  Eyes Blurred vision?: No Double vision?: No  Ears/Nose/Throat Sore throat?: Yes Sinus problems?: No  Hematologic/Lymphatic Swollen glands?: Yes Easy bruising?: No  Cardiovascular Leg swelling?: No Chest pain?: Yes  Respiratory Cough?: No Shortness of breath?: Yes  Endocrine Excessive thirst?: No  Musculoskeletal Back pain?: Yes Joint pain?: Yes  Neurological Headaches?: Yes Dizziness?: No  Psychologic Depression?: No Anxiety?: No  Physical Exam: Ht _0  (1.651 m)   Wt 164 lb 9.6 oz (74.7 kg)   LMP 04/24/2016   BMI 27.39 kg/m   Constitutional: Well nourished. Alert and oriented, No acute distress. HEENT: [place] AT, moist mucus membranes. Trachea midline, no masses. Cardiovascular: No clubbing, cyanosis, or edema. Respiratory: Normal respiratory effort, no increased work of breathing. GI: Abdomen is soft, non tender, non distended, no abdominal masses. Liver and spleen not palpable.  No hernias appreciated.  Stool sample for occult testing is not indicated.   GU: No CVA tenderness.  No bladder fullness or masses.   Skin: No rashes, bruises or suspicious lesions. Lymph: No cervical or  inguinal adenopathy. Neurologic: Grossly intact, no focal deficits, moving all 4 extremities. Psychiatric: Normal mood and affect.  Laboratory Data: Lab Results  Component Value Date   WBC 6.3 04/02/2016   HGB 14.1 04/02/2016   HCT 40.3 04/02/2016   MCV 82.4 04/02/2016   PLT 314 04/02/2016    Lab Results  Component Value Date   CREATININE 0.71 04/02/2016    Lab Results  Component Value Date   TSH 1.270 08/08/2015    Lab Results  Component Value Date   AST 18 04/02/2016   Lab Results  Component Value Date   ALT 18 04/02/2016    Urinalysis Significant for > 30 RBC's/hpf  Pertinent Imaging: CLINICAL DATA:  Left flank and abdominal pain on/off x26mo began in [place] N/V/D x170mopt states gross hematuria, pain worsened last night, hx stones, no  surgical hx, no trauma, neg preg.  EXAM: CT ABDOMEN AND PELVIS WITHOUT CONTRAST  TECHNIQUE: Multidetector CT imaging of the abdomen and pelvis was performed following the standard protocol without IV contrast.  COMPARISON:  None.  FINDINGS: Lower chest: There is minimal bibasilar atelectasis. No pleural effusions. Heart size is normal. No imaged pericardial effusion or significant coronary artery calcifications.  Hepatobiliary: Gallbladder is present. No focal abnormality identified within the liver.  Pancreas: Unremarkable in appearance.  Spleen: Normal noncontrast appearance.  Adrenals/Urinary Tract: There is mild enlargement of the left kidney associated with mild fullness of the left collecting system and a 3 mm stone within the left ureteropelvic junction. Distal left ureter is normal in appearance. The right kidney and ureter are normal in appearance. Adrenal glands are normal.  Stomach/Bowel: Stomach and small bowel loops are normal in appearance. The appendix is well seen and has a normal appearance. Colonic loops are normal in appearance.  Vascular/Lymphatic: No evidence for aortic aneurysm.  Within the right lower quadrant, there are small mesenteric lymph nodes, nonspecific in appearance and measuring up to 0.7 cm in short axis. No significant retroperitoneal adenopathy.  Reproductive: Uterus is present. No adnexal mass or free pelvic fluid.  Other: None  Musculoskeletal: Visualized osseous structures have a normal appearance.  IMPRESSION: 1. Left ureteropelvic junction calculus measuring 3 mm. 2. Enlargement of left kidney and mild left hydronephrosis. 3. Normal appendix. 4. Small nonspecific right lower quadrant mesenteric lymph nodes. 5. Minimal bibasilar atelectasis.   Electronically Signed   By: Nolon Nations M.D.   On: 04/02/2016 12:30  Assessment & Plan:    1. Left ureteral stone:   We discussed MET, ESWL and URS/LL/ureteral stent as treatment options for her left UVJ stone. She would like to pursue MET therapy at this time.  She will restart the tamsulosin, strain her urine and increase her fluids to 2.5 mL daily.  She will then return in 2 weeks with a KUB and symptom recheck.  If she should experience cardiac symptoms with the tamsulosin, fevers or intractable pain/vomiting, she will contact our office immediately or seek treatment in the ED.    2. Left hydronephrosis:   Patient was found to have left hydronephrosis due to an ureteral stone.  A RUS will be obtained after resolution of the stone to ensure the hydronephrosis has resolved.    3. Gross hematuria:   We will continue to monitor the patient's UA after the treatment/passage of the stone to ensure the hematuria has resolved.  If hematuria persists, we will pursue a hematuria workup with CT Urogram and cystoscopy if appropriate.  Return in about 2 weeks (around 05/20/2016) for KUB and symptom recheck.  These notes generated with voice recognition software. I apologize for typographical errors.  Zara Council, [facility] [address], [place], [place] [address] [phone]

## 2016-05-07 ENCOUNTER — Ambulatory Visit: Payer: Medicaid Other | Admitting: Obstetrics and Gynecology

## 2016-05-08 LAB — CULTURE, URINE COMPREHENSIVE

## 2016-05-18 ENCOUNTER — Ambulatory Visit: Payer: Medicaid Other | Admitting: Urology

## 2016-05-21 ENCOUNTER — Telehealth: Payer: Self-pay | Admitting: *Deleted

## 2016-05-21 DIAGNOSIS — N201 Calculus of ureter: Secondary | ICD-10-CM

## 2016-05-21 NOTE — Telephone Encounter (Signed)
LMOM for patient because I was unsure if patient knew about having a KUB before appointment tomorrow because I did not see an order placed. Let patient know to go have KUB xray tomorrow before appointment and if patient had any questions to call office back. Order for KUB placed and after speaking with Dr. Erlene Quan ordered pregnancy test also.

## 2016-05-22 ENCOUNTER — Ambulatory Visit (INDEPENDENT_AMBULATORY_CARE_PROVIDER_SITE_OTHER): Payer: Medicaid Other | Admitting: Urology

## 2016-05-22 ENCOUNTER — Ambulatory Visit
Admission: RE | Admit: 2016-05-22 | Discharge: 2016-05-22 | Disposition: A | Discharge status: Home / Self Care | Payer: Medicaid Other | Source: Ambulatory Visit | Attending: Urology | Admitting: Urology

## 2016-05-22 ENCOUNTER — Encounter: Payer: Self-pay | Admitting: Urology

## 2016-05-22 VITALS — BP 133/82 | HR 91 | Ht 65.0 in | Wt 164.3 lb

## 2016-05-22 DIAGNOSIS — N201 Calculus of ureter: Secondary | ICD-10-CM | POA: Diagnosis not present

## 2016-05-22 DIAGNOSIS — N132 Hydronephrosis with renal and ureteral calculous obstruction: Secondary | ICD-10-CM

## 2016-05-22 DIAGNOSIS — R310 Gross hematuria: Secondary | ICD-10-CM | POA: Diagnosis not present

## 2016-05-22 LAB — URINALYSIS, COMPLETE
Bilirubin, UA: NEGATIVE
GLUCOSE, UA: NEGATIVE
KETONES UA: NEGATIVE
LEUKOCYTES UA: NEGATIVE
NITRITE UA: NEGATIVE
Protein, UA: NEGATIVE
SPEC GRAV UA: 1.025 (ref 1.005–1.030)
Urobilinogen, Ur: 0.2 mg/dL (ref 0.2–1.0)
pH, UA: 5 (ref 5.0–7.5)

## 2016-05-22 LAB — MICROSCOPIC EXAMINATION: WBC, UA: NONE SEEN /hpf (ref 0–?)

## 2016-05-22 NOTE — Progress Notes (Signed)
05/22/2016 11:27 PM   Sonya Bauer Apr 02, 1991 [id number]  Referring provider: No referring provider defined for this encounter.  Chief Complaint  Patient presents with  . Nephrolithiasis    2 week follow up    HPI: Patient is a 25 year old Caucasian female who presents today for a 2 week follow up for a left UVJ stone.    Previous history Patient states the onset of the pain was two months ago.   It was sharp.  It lasted for several minutes.  The pain was located in the left hip and radiated to the left flank.  The pain was a 6/10.  Nothing made the pain better.   Nothing made the pain worse.  She did have or did not have gross hematuria, nausea or vomiting. UA in the ED noted TNTC RBC's/hpf .  Serum creatinine was 0.71.  She was given narcotic pain meds and tamsulosin.  CT renal stone study completed on 04/02/2016 noted left ureteropelvic junction calculus measuring 3 mm.  Enlargement of left kidney and mild left hydronephrosis.  Normal appendix.  Small nonspecific right lower quadrant mesenteric lymph nodes.  Minimal bibasilar atelectasis.  She experienced tachycardia after she started the tamsulosin, so she discontinued the medication.   She then was seen in at [facility] yesterday and CT scan at that time demonstrated that the stone remains in the left UVJ.    They also told her that her IUD may be in the wrong position.  She has an appointment with Mrs. Shambley.  IUD was found in the correct position.    Today, she has not experienced any recent intense pain.  She feels she may have passed it 5 days ago.  She is currently having urinary frequency and nocturia. She has some back pain, nausea, constipation and vomiting.   She feels the symptoms are more attributed to her IUD.    Her UA today demonstrates 11-30 RBCs per power field.  KUB taken today did not identify any nephrolithiasis.  I have personally reviewed the films.    She does have a prior history of stones.  Stone  composition at this time is unknown.      PMH: Past Medical History:  Diagnosis Date  . Depression   . Headache   . Heartburn   . Hypertension   . Pregnancy induced hypertension   . 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  Home Medications:    Medication List       Accurate as of 05/22/16 11:59 PM. Always use your most recent med list.          acetaminophen 500 MG tablet Commonly known as:  TYLENOL Take 1,000 mg by mouth every 4 (four) hours.   acyclovir 200 MG capsule Commonly known as:  ZOVIRAX Take 200 mg by mouth every 4 (four) hours.   cephALEXin 500 MG capsule Commonly known as:  KEFLEX TK ONE C PO TID   FLUoxetine 10 MG capsule Commonly known as:  PROZAC Take 1 capsule (10 mg total) by mouth daily. To stop Lexapro, and start 1 pill daily x 2 weeks, then increase to 2 pills daily.   fluticasone 50 MCG/ACT nasal spray Commonly known as:  FLONASE 1 spray by Nasal route daily. Spray in each nostril.   IRON PO Take 1 tablet by mouth daily.   levonorgestrel 20 MCG/24HR IUD Commonly known as:  MIRENA 1 Intra Uterine Device (1 each  total) by Intrauterine route once.   NIFEdipine 30 MG 24 hr tablet Commonly known as:  NIFEDICAL XL TAKE 1 TABLET BY MOUTH DAILY. CAN INCREASE TO TWICE DAILY AS NEEDED FOR SYMPTOMATIC CONTRACTIONS.   oxyCODONE-acetaminophen 5-325 MG tablet Commonly known as:  PERCOCET/ROXICET TK 1 T PO Q 6 H PRN   sodium chloride 0.65 % nasal spray Commonly known as:  OCEAN 1 drop by Each Nare route as needed.       Allergies: No Known Allergies  Family History: Family History  Problem Relation Age of Onset  . Hypertension Father   . Kidney disease Neg Hx   . Bladder Cancer Neg Hx     Social History:  reports that she has never smoked. She has never used smokeless tobacco. She reports that she does not drink alcohol or use drugs.  ROS: UROLOGY Frequent Urination?: Yes Hard to postpone  urination?: No Burning/pain with urination?: No Get up at night to urinate?: Yes Leakage of urine?: No Urine stream starts and stops?: No Trouble starting stream?: No Do you have to strain to urinate?: No Blood in urine?: No Urinary tract infection?: No Sexually transmitted disease?: No Injury to kidneys or bladder?: No Painful intercourse?: No Weak stream?: No Currently pregnant?: No Vaginal bleeding?: No Last menstrual period?: n  Gastrointestinal Nausea?: Yes Vomiting?: Yes Indigestion/heartburn?: No Diarrhea?: No Constipation?: Yes  Constitutional Fever: No Night sweats?: No Weight loss?: No Fatigue?: No  Skin Skin rash/lesions?: No Itching?: No  Eyes Blurred vision?: No Double vision?: No  Ears/Nose/Throat Sore throat?: No Sinus problems?: No  Hematologic/Lymphatic Swollen glands?: No Easy bruising?: No  Cardiovascular Leg swelling?: No Chest pain?: No  Respiratory Cough?: No Shortness of breath?: No  Endocrine Excessive thirst?: No  Musculoskeletal Back pain?: Yes Joint pain?: No  Neurological Headaches?: No Dizziness?: No  Psychologic Depression?: No Anxiety?: No  Physical Exam: BP 133/82   Pulse 91   Ht 5\' 5"  (1.651 m)   Wt 164 lb 4.8 oz (74.5 kg)   LMP 04/24/2016   BMI 27.34 kg/m   Constitutional: Well nourished. Alert and oriented, No acute distress. HEENT: [place] AT, moist mucus membranes. Trachea midline, no masses. Cardiovascular: No clubbing, cyanosis, or edema. Respiratory: Normal respiratory effort, no increased work of breathing. GI: Abdomen is soft, non tender, non distended, no abdominal masses. Liver and spleen not palpable.  No hernias appreciated.  Stool sample for occult testing is not indicated.   GU: No CVA tenderness.  No bladder fullness or masses.   Skin: No rashes, bruises or suspicious lesions. Lymph: No cervical or inguinal adenopathy. Neurologic: Grossly intact, no focal deficits, moving all 4  extremities. Psychiatric: Normal mood and affect.  Laboratory Data: Lab Results  Component Value Date   WBC 6.3 04/02/2016   HGB 14.1 04/02/2016   HCT 40.3 04/02/2016   MCV 82.4 04/02/2016   PLT 314 04/02/2016    Lab Results  Component Value Date   CREATININE 0.71 04/02/2016    Lab Results  Component Value Date   TSH 1.270 08/08/2015    Lab Results  Component Value Date   AST 18 04/02/2016   Lab Results  Component Value Date   ALT 18 04/02/2016    Urinalysis Significant for 11-30 RBC's/hpf.  See EPIC.    Pertinent Imaging: CLINICAL DATA:  24 year old female with a history of left-sided nephrolithiasis  EXAM: ABDOMEN - 1 VIEW  COMPARISON:  CT 04/02/2016  FINDINGS: Gas within stomach, small bowel, colon. No abnormal distention. Mild  stool burden.  Unremarkable appearance of the musculoskeletal structures.  No unexpected calcifications or soft tissue density.  No calcifications of the left abdomen, with no visualization of the small stone which was in the region of the left ureteropelvic junction on the most recent CT. No stone identified along the course of the left ureter or within the anatomic pelvis.  Intrauterine device has been placed since the comparison study.  IMPRESSION: Normal bowel gas pattern.  No evidence of nephrolithiasis. Specifically the stone at the left ureteropelvic junction on the prior CT is not visualized.  Interval placement of intrauterine device.  Signed,  Dulcy Fanny. Earleen Newport, DO  [facility]  [facility]   Electronically Signed   By: Corrie Mckusick D.O.   On: 05/22/2016 09:02   Assessment & Plan:    1. Left ureteral stone:   Patient feels she may have passed the stone.  RUS is pending.  2. Left hydronephrosis  - ordered RUS for further evaluation  - RTC for report  3. Gross hematuria:   We will continue to monitor the patient's UA after the  treatment/passage of the stone to ensure the hematuria has resolved.  If hematuria persists, we will pursue a hematuria workup with CT Urogram and cystoscopy if appropriate.  Return for RUS report .  These notes generated with voice recognition software. I apologize for typographical errors.  Zara Council, [facility] [address] [place], [place] [address] [phone]

## 2016-05-31 ENCOUNTER — Emergency Department
Admission: EM | Admit: 2016-05-31 | Discharge: 2016-05-31 | Disposition: A | Discharge status: Home / Self Care | Payer: Medicaid Other | Attending: Emergency Medicine | Admitting: Emergency Medicine

## 2016-05-31 ENCOUNTER — Emergency Department: Payer: Medicaid Other

## 2016-05-31 DIAGNOSIS — Z79899 Other long term (current) drug therapy: Secondary | ICD-10-CM | POA: Diagnosis not present

## 2016-05-31 DIAGNOSIS — I10 Essential (primary) hypertension: Secondary | ICD-10-CM | POA: Diagnosis not present

## 2016-05-31 DIAGNOSIS — R319 Hematuria, unspecified: Secondary | ICD-10-CM | POA: Diagnosis present

## 2016-05-31 DIAGNOSIS — N201 Calculus of ureter: Secondary | ICD-10-CM | POA: Insufficient documentation

## 2016-05-31 LAB — URINALYSIS COMPLETE WITH MICROSCOPIC (ARMC ONLY)
Bilirubin Urine: NEGATIVE
Glucose, UA: NEGATIVE mg/dL
Ketones, ur: NEGATIVE mg/dL
NITRITE: NEGATIVE
PH: 6 (ref 5.0–8.0)
PROTEIN: NEGATIVE mg/dL
Specific Gravity, Urine: 1.019 (ref 1.005–1.030)

## 2016-05-31 LAB — POCT PREGNANCY, URINE: PREG TEST UR: NEGATIVE

## 2016-05-31 MED ORDER — ONDANSETRON HCL 4 MG PO TABS: 4.0000 mg | ORAL_TABLET | Freq: Every day | ORAL | 1 refills | Status: DC | PRN

## 2016-05-31 MED ORDER — ONDANSETRON HCL 4 MG/2ML IJ SOLN
4.0000 mg | Freq: Once | INTRAMUSCULAR | Status: AC
  Administered 2016-05-31: 4 mg via INTRAVENOUS
  Filled 2016-05-31: qty 2

## 2016-05-31 MED ORDER — SODIUM CHLORIDE 0.9 % IV SOLN
1000.0000 mL | Freq: Once | INTRAVENOUS | Status: AC
  Administered 2016-05-31: 1000 mL via INTRAVENOUS

## 2016-05-31 MED ORDER — TAMSULOSIN HCL 0.4 MG PO CAPS: 0.4000 mg | ORAL_CAPSULE | Freq: Every day | ORAL | 0 refills | Status: DC

## 2016-05-31 MED ORDER — KETOROLAC TROMETHAMINE 30 MG/ML IJ SOLN
30.0000 mg | Freq: Once | INTRAMUSCULAR | Status: AC
  Administered 2016-05-31: 30 mg via INTRAVENOUS
  Filled 2016-05-31: qty 1

## 2016-05-31 NOTE — ED Triage Notes (Signed)
See urology - had a kidney stone which she passed. [place] scheduled Friday to look at her kidneys. Yesterday while out of town was peeing blood. Urine clear today but is nauseous

## 2016-05-31 NOTE — ED Provider Notes (Signed)
[facility] Emergency Department Provider Note   ____________________________________________    I have reviewed the triage vital signs and the nursing notes.   HISTORY  Chief Complaint Hematuria     HPI Sonya Bauer is a 25 y.o. female who presents with complaints of hematuria and left-sided abdominal pain. Patient has been dealing with a kidney stone for several months now. She follows with [facility]. She had recently assumed that she had passed it because they were not able to see it on a KUB but over the last day she developed hematuria and significant pain. She has had nausea as well. No fevers or chills. No dysuria.   Past Medical History:  Diagnosis Date  . Depression   . Headache   . Heartburn   . Hypertension   . Pregnancy induced hypertension   . 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 Vaginal delivery 07/03/2015  . 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 Elevated BP 05/08/2015    No past surgical history on file.  Prior to Admission medications   Medication Sig Start Date End Date Taking? Authorizing Provider  acetaminophen (TYLENOL) 500 MG tablet Take 1,000 mg by mouth every 4 (four) hours.   Yes Historical Provider, MD  acyclovir (ZOVIRAX) 200 MG capsule Take 200 mg by mouth every 4 (four) hours.   Yes Historical Provider, MD  levonorgestrel (MIRENA) 20 MCG/24HR IUD 1 Intra Uterine Device (1 each total) by Intrauterine route once. 04/30/16  Yes Melody N Shambley, CNM  NIFEdipine (NIFEDICAL XL) 30 MG 24 hr tablet TAKE 1 TABLET BY MOUTH DAILY. CAN INCREASE TO TWICE DAILY AS NEEDED FOR SYMPTOMATIC CONTRACTIONS. 04/30/16  Yes Melody N Shambley, CNM  cephALEXin (KEFLEX) 500 MG capsule TK ONE C PO TID 04/02/16   Historical Provider, MD  FLUoxetine (PROZAC) 10 MG capsule Take 1 capsule (10 mg total) by mouth daily. To stop Lexapro, and start 1 pill daily x 2 weeks, then increase to 2 pills  daily. Patient not taking: Reported on 05/22/2016 12/13/15   Melody N Shambley, CNM  fluticasone (FLONASE) 50 MCG/ACT nasal spray 1 spray by Nasal route daily. Spray in each nostril. 01/24/15   Historical Provider, MD  IRON PO Take 1 tablet by mouth daily.    Historical Provider, MD  ondansetron (ZOFRAN) 4 MG tablet Take 1 tablet (4 mg total) by mouth daily as needed for nausea or vomiting. 05/31/16   Lavonia Drafts, MD  oxyCODONE-acetaminophen (PERCOCET/ROXICET) 5-325 MG tablet TK 1 T PO Q 6 H PRN 04/02/16   Historical Provider, MD  sodium chloride (OCEAN) 0.65 % nasal spray 1 drop by Each Nare route as needed. 01/24/15   Historical Provider, MD  tamsulosin (FLOMAX) 0.4 MG CAPS capsule Take 1 capsule (0.4 mg total) by mouth daily. 05/31/16   Lavonia Drafts, MD     Allergies Review of patient's allergies indicates no known allergies.  Family History  Problem Relation Age of Onset  . Hypertension Father   . Kidney disease Neg Hx   . Bladder Cancer Neg Hx     Social History Social History  Substance Use Topics  . Smoking status: Never Smoker  . Smokeless tobacco: Never Used  . Alcohol use No    Review of Systems  Constitutional: No fever/chills   Gastrointestinal:As above Genitourinary: Negative for dysuria. Musculoskeletal: Negative for back pain.   10-point ROS otherwise negative.  ____________________________________________   PHYSICAL EXAM:  VITAL  SIGNS: ED Triage Vitals  Enc Vitals Group     BP 05/31/16 2107 (!) 135/100     Pulse Rate 05/31/16 2107 73     Resp 05/31/16 2107 18     Temp 05/31/16 2107 98.2 F (36.8 C)     Temp src --      SpO2 05/31/16 2108 100 %     Weight 05/31/16 2107 165 lb (74.8 kg)     Height 05/31/16 2107 5\' 5"  (1.651 m)     Head Circumference --      Peak Flow --      Pain Score 05/31/16 2107 0     Pain Loc --      Pain Edu? --      Excl. in [place]? --     Constitutional: Alert and oriented. No acute distress. Pleasant and  interactive Eyes: Conjunctivae are normal.  Head: Atraumatic. Nose: No congestion/rhinnorhea. Mouth/Throat: Mucous membranes are moist.   Neck:  Painless ROM Cardiovascular: Normal rate, regular rhythm. Kermit Balo peripheral circulation. Respiratory: Normal respiratory effort.  No retractions. Gastrointestinal: Soft and nontender. No distention.  No CVA tenderness. Genitourinary: deferred Musculoskeletal: No lower extremity tenderness nor edema.  Warm and well perfused Neurologic:  Normal speech and language. No gross focal neurologic deficits are appreciated.  Skin:  Skin is warm, dry and intact. No rash noted. Psychiatric: Mood and affect are normal. Speech and behavior are normal.  ____________________________________________   LABS (all labs ordered are listed, but only abnormal results are displayed)  Labs Reviewed  URINALYSIS COMPLETEWITH MICROSCOPIC ([place]) - Abnormal; Notable for the following:       Result Value   Color, Urine YELLOW (*)    APPearance HAZY (*)    Hgb urine dipstick 2+ (*)    Leukocytes, UA TRACE (*)    Bacteria, UA RARE (*)    Squamous Epithelial / LPF 0-5 (*)    All other components within normal limits  POC URINE PREG, ED  POCT PREGNANCY, URINE   ____________________________________________  EKG  None ____________________________________________  RADIOLOGY  Ultrasound shows no hydronephrosis ____________________________________________   PROCEDURES  Procedure(s) performed: No    Critical Care performed: No ____________________________________________   INITIAL IMPRESSION / ASSESSMENT AND PLAN / ED COURSE  Pertinent labs & imaging results that were available during my care of the patient were reviewed by me and considered in my medical decision making (see chart for details).  Patient's presentation is consistent with ureterolithiasis given hematuria, left-sided pain and known kidney stone. Patient had pain relief with IV  Toradol. Ultrasound does not show hydronephrosis. Patient has outpatient follow-up arranged. She is to return if any nausea vomiting fevers or chills.  Clinical Course   ____________________________________________   FINAL CLINICAL IMPRESSION(S) / ED DIAGNOSES  Final diagnoses:  Ureterolithiasis      NEW MEDICATIONS STARTED DURING THIS VISIT:  Discharge Medication List as of 05/31/2016 10:33 PM    START taking these medications   Details  ondansetron (ZOFRAN) 4 MG tablet Take 1 tablet (4 mg total) by mouth daily as needed for nausea or vomiting., Starting Sun 05/31/2016, Print    tamsulosin (FLOMAX) 0.4 MG CAPS capsule Take 1 capsule (0.4 mg total) by mouth daily., Starting Sun 05/31/2016, Print         Note:  This document was prepared using Dragon voice recognition software and may include unintentional dictation errors.    Lavonia Drafts, MD 05/31/16 2248

## 2016-05-31 NOTE — ED Notes (Signed)
Patient reports she had procedure on her kidneys last week.  Yesterday noticed blood in her urine.  Patient also reports having increased pain.

## 2016-06-01 ENCOUNTER — Other Ambulatory Visit
Admission: RE | Admit: 2016-06-01 | Discharge: 2016-06-01 | Disposition: A | Discharge status: Home / Self Care | Payer: Medicaid Other | Source: Ambulatory Visit | Attending: Urology | Admitting: Urology

## 2016-06-01 ENCOUNTER — Telehealth: Payer: Self-pay

## 2016-06-01 ENCOUNTER — Other Ambulatory Visit: Payer: Self-pay | Admitting: Urology

## 2016-06-01 ENCOUNTER — Ambulatory Visit (INDEPENDENT_AMBULATORY_CARE_PROVIDER_SITE_OTHER): Payer: Medicaid Other | Admitting: Urology

## 2016-06-01 ENCOUNTER — Encounter: Payer: Self-pay | Admitting: Urology

## 2016-06-01 ENCOUNTER — Ambulatory Visit
Admission: RE | Admit: 2016-06-01 | Discharge: 2016-06-01 | Disposition: A | Discharge status: Home / Self Care | Payer: Medicaid Other | Source: Ambulatory Visit | Attending: Urology | Admitting: Urology

## 2016-06-01 VITALS — BP 174/99 | HR 69 | Ht 65.0 in | Wt 165.7 lb

## 2016-06-01 DIAGNOSIS — R310 Gross hematuria: Secondary | ICD-10-CM | POA: Diagnosis not present

## 2016-06-01 DIAGNOSIS — N132 Hydronephrosis with renal and ureteral calculous obstruction: Secondary | ICD-10-CM

## 2016-06-01 DIAGNOSIS — Z3202 Encounter for pregnancy test, result negative: Secondary | ICD-10-CM | POA: Diagnosis not present

## 2016-06-01 DIAGNOSIS — N201 Calculus of ureter: Secondary | ICD-10-CM

## 2016-06-01 DIAGNOSIS — N949 Unspecified condition associated with female genital organs and menstrual cycle: Secondary | ICD-10-CM

## 2016-06-01 DIAGNOSIS — N9489 Other specified conditions associated with female genital organs and menstrual cycle: Secondary | ICD-10-CM

## 2016-06-01 LAB — URINALYSIS, COMPLETE
Bilirubin, UA: NEGATIVE
GLUCOSE, UA: NEGATIVE
Ketones, UA: NEGATIVE
Nitrite, UA: NEGATIVE
PH UA: 6.5 (ref 5.0–7.5)
PROTEIN UA: NEGATIVE
Specific Gravity, UA: 1.015 (ref 1.005–1.030)
UUROB: 1 mg/dL (ref 0.2–1.0)

## 2016-06-01 LAB — PREGNANCY, URINE: PREG TEST UR: NEGATIVE

## 2016-06-01 LAB — MICROSCOPIC EXAMINATION
Epithelial Cells (non renal): >10 /hpf — AB (ref 0–10)
RBC, UA: >30 /hpf — AB (ref 0–?)

## 2016-06-01 LAB — HCG, QUANTITATIVE, PREGNANCY

## 2016-06-01 MED ORDER — AMOXICILLIN-POT CLAVULANATE 875-125 MG PO TABS: 1.0000 | ORAL_TABLET | Freq: Two times a day (BID) | ORAL | 0 refills | Status: DC

## 2016-06-01 NOTE — Telephone Encounter (Signed)
Patient will need a CT Renal protocol.  See if it can be performed today.  She will need a pregnancy test.  I would also like an UA and urine culture performed.

## 2016-06-01 NOTE — Telephone Encounter (Signed)
Pt called stating over the weekend she was continuing to have gross hematuria, nausea, and vomiting. Pt stated that Sunday the pain was so severe she went to the ER. A RUS was performed. Pt called wanting to know if she needs to be seen prior to Thursday. Please advise.

## 2016-06-01 NOTE — Telephone Encounter (Signed)
-----   Message from Nori Riis, PA-C sent at 06/01/2016  1:04 PM EDT ----- There are no ureteral stones seen on the CT scan, but there is something in her right ovary.  I would contact her gynecologist for further evaluation.

## 2016-06-01 NOTE — Progress Notes (Signed)
06/01/2016 4:33 PM   Sonya Bauer Aug 13, 1991 [id number]  Referring provider: No referring provider defined for this encounter.  Chief Complaint  Patient presents with  . Results    CT    HPI: Patient is a 25 year old Caucasian female who called this morning stating she had been seen in the ED over the weekend for gross hematuria and suprapubic pain.      Previous history Patient states the onset of the pain was two months ago.   It was sharp.  It lasted for several minutes.  The pain was located in the left hip and radiated to the left flank.  The pain was a 6/10.  Nothing made the pain better.   Nothing made the pain worse.  She did have or did not have gross hematuria, nausea or vomiting. UA in the ED noted TNTC RBC's/hpf .  Serum creatinine was 0.71.  She was given narcotic pain meds and tamsulosin.  CT renal stone study completed on 04/02/2016 noted left ureteropelvic junction calculus measuring 3 mm.  Enlargement of left kidney and mild left hydronephrosis.  Normal appendix.  Small nonspecific right lower quadrant mesenteric lymph nodes.  Minimal bibasilar atelectasis.  She experienced tachycardia after she started the tamsulosin, so she discontinued the medication.   She then was seen in at [facility] yesterday and CT scan at that time demonstrated that the stone remains in the left UVJ.  They also told her that her IUD may be in the wrong position.  She has an appointment with Mrs. Shambley.  IUD was found in the correct position.    RUS in the ED was negative.  No hydronephrosis was seen.  She was still having intense suprapubic pain and gross hematuria as of this morning.  The pain was 10/10.  Nothing helps the pain.  Nothing makes it worse.  She has been having nausea and vomiting.  She denies fevers and chills.    A STAT CT Renal stone study was performed today and it noted a 3. 3 mm nonobstructive left kidney upper pole calculus. No hydronephrosis or hydroureter. No ureteral  calculus.  Hypodense lesion of the right ovary, internal density 3 Hounsfield units, indistinct margins but approximately 3.2 by 2.2 cm on image 51/2.  I personally reviewed the films with the patient.    She was instructed to contact her gynecologist, but she could not speak with her today.    Her UA today demonstrates > 30 RBCs per power field.    She does have a prior history of stones.  Stone composition at this time is unknown.      PMH: Past Medical History:  Diagnosis Date  . Depression   . Headache   . Heartburn   . Hypertension   . Pregnancy induced hypertension   . 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  Home Medications:    Medication List       Accurate as of 06/01/16  4:33 PM. Always use your most recent med list.          acetaminophen 500 MG tablet Commonly known as:  TYLENOL Take 1,000 mg by mouth every 4 (four) hours.   acyclovir 200 MG capsule Commonly known as:  ZOVIRAX Take 200 mg by mouth every 4 (four) hours.   amoxicillin-clavulanate 875-125 MG tablet Commonly known as:  AUGMENTIN Take 1 tablet by mouth every 12 (twelve) hours.   cephALEXin 500 MG  capsule Commonly known as:  KEFLEX TK ONE C PO TID   FLUoxetine 10 MG capsule Commonly known as:  PROZAC Take 1 capsule (10 mg total) by mouth daily. To stop Lexapro, and start 1 pill daily x 2 weeks, then increase to 2 pills daily.   fluticasone 50 MCG/ACT nasal spray Commonly known as:  FLONASE 1 spray by Nasal route daily. Spray in each nostril.   IRON PO Take 1 tablet by mouth daily.   levonorgestrel 20 MCG/24HR IUD Commonly known as:  MIRENA 1 Intra Uterine Device (1 each total) by Intrauterine route once.   NIFEdipine 30 MG 24 hr tablet Commonly known as:  NIFEDICAL XL TAKE 1 TABLET BY MOUTH DAILY. CAN INCREASE TO TWICE DAILY AS NEEDED FOR SYMPTOMATIC CONTRACTIONS.   ondansetron 4 MG tablet Commonly known as:  ZOFRAN Take 1 tablet (4 mg  total) by mouth daily as needed for nausea or vomiting.   oxyCODONE-acetaminophen 5-325 MG tablet Commonly known as:  PERCOCET/ROXICET TK 1 T PO Q 6 H PRN   sodium chloride 0.65 % nasal spray Commonly known as:  OCEAN 1 drop by Each Nare route as needed.   tamsulosin 0.4 MG Caps capsule Commonly known as:  FLOMAX Take 1 capsule (0.4 mg total) by mouth daily.       Allergies: No Known Allergies  Family History: Family History  Problem Relation Age of Onset  . Hypertension Father   . Kidney disease Neg Hx   . Bladder Cancer Neg Hx     Social History:  reports that she has never smoked. She has never used smokeless tobacco. She reports that she does not drink alcohol or use drugs.  ROS: UROLOGY Frequent Urination?: Yes Hard to postpone urination?: No Burning/pain with urination?: No Get up at night to urinate?: No Leakage of urine?: No Urine stream starts and stops?: No Trouble starting stream?: No Do you have to strain to urinate?: No Blood in urine?: Yes Urinary tract infection?: No Sexually transmitted disease?: No Injury to kidneys or bladder?: No Painful intercourse?: No Weak stream?: No Currently pregnant?: No Vaginal bleeding?: No Last menstrual period?: n  Gastrointestinal Nausea?: Yes Vomiting?: Yes Indigestion/heartburn?: No Diarrhea?: Yes Constipation?: Yes  Constitutional Fever: No Night sweats?: No Weight loss?: No Fatigue?: Yes  Skin Skin rash/lesions?: No Itching?: No  Eyes Blurred vision?: No Double vision?: No  Ears/Nose/Throat Sore throat?: No Sinus problems?: No  Hematologic/Lymphatic Swollen glands?: No Easy bruising?: No  Cardiovascular Leg swelling?: No Chest pain?: No  Respiratory Cough?: No Shortness of breath?: No  Endocrine Excessive thirst?: No  Musculoskeletal Back pain?: No Joint pain?: No  Neurological Headaches?: Yes Dizziness?: Yes  Psychologic Depression?: No Anxiety?: No  Physical  Exam: BP (!) 174/99   Pulse 69   Ht 5\' 5"  (1.651 m)   Wt 165 lb 11.2 oz (75.2 kg)   LMP 04/24/2016   BMI 27.57 kg/m   Constitutional: Well nourished. Alert and oriented, No acute distress. HEENT: [place] AT, moist mucus membranes. Trachea midline, no masses. Cardiovascular: No clubbing, cyanosis, or edema. Respiratory: Normal respiratory effort, no increased work of breathing. GI: Abdomen is soft, non tender, non distended, no abdominal masses. Liver and spleen not palpable.  No hernias appreciated.  Stool sample for occult testing is not indicated.   GU: No CVA tenderness.  No bladder fullness or masses.   Skin: No rashes, bruises or suspicious lesions. Lymph: No cervical or inguinal adenopathy. Neurologic: Grossly intact, no focal deficits, moving all 4  extremities. Psychiatric: Normal mood and affect.  Laboratory Data: Lab Results  Component Value Date   WBC 6.3 04/02/2016   HGB 14.1 04/02/2016   HCT 40.3 04/02/2016   MCV 82.4 04/02/2016   PLT 314 04/02/2016    Lab Results  Component Value Date   CREATININE 0.71 04/02/2016    Lab Results  Component Value Date   TSH 1.270 08/08/2015    Lab Results  Component Value Date   AST 18 04/02/2016   Lab Results  Component Value Date   ALT 18 04/02/2016    Urinalysis Significant for >30 RBC's/hpf.  See EPIC.    Pertinent Imaging: CLINICAL DATA:  Gross hematuria.  Ureteral calculus.  EXAM: CT ABDOMEN AND PELVIS WITHOUT CONTRAST  TECHNIQUE: Multidetector CT imaging of the abdomen and pelvis was performed following the standard protocol without IV contrast.  COMPARISON:  05/31/2016 ultrasound and 04/02/2016 CT  FINDINGS: Lower chest:  Visualized portion unremarkable.  Hepatobiliary: Visualized portion unremarkable  Pancreas: Visualized portion unremarkable  Spleen: Visualized portion unremarkable  Adrenals/Urinary Tract: Adrenal glands normal 3. 3 mm nonobstructive left kidney upper pole calculus. No  hydronephrosis or hydroureter. No ureteral calculus.  Stomach/Bowel: Unremarkable where visualized.  Vascular/Lymphatic: Unremarkable  Reproductive: Hypodense lesion of the right ovary, internal density 3 Hounsfield units, indistinct margins but approximately 3.2 by 2.2 cm on image 51/2. Interval placement of an IUD,  Other:  Musculoskeletal:   Electronically Signed   By: Van Clines M.D.   On: 06/01/2016 11:52  Assessment & Plan:    1. Left ureteral stone:   CT scan did not note a left ureteral stone  2. Left hydronephrosis  - RUS and CT scan did not demonstrate hydronephrosis-resolved   3. Gross hematuria:   Urine sent for culture.  Started Augmentin 875/125, one tablet twice daily.  Follow up pending urine culture.    4. Right adnexal mass:   Follow up with gynecology.    Return for pending urine culture.  These notes generated with voice recognition software. I apologize for typographical errors.  Zara Council, [facility] [address] [place], [place] [address] [phone]

## 2016-06-01 NOTE — Telephone Encounter (Signed)
Spoke with pt in reference to CT results. Pt voiced understanding.  

## 2016-06-01 NOTE — Telephone Encounter (Signed)
Spoke with pt in reference to CT renal stone study. Made aware of appt. Orders placed.

## 2016-06-04 ENCOUNTER — Ambulatory Visit: Payer: Medicaid Other | Admitting: Urology

## 2016-06-04 ENCOUNTER — Telehealth: Payer: Self-pay

## 2016-06-04 LAB — CULTURE, URINE COMPREHENSIVE

## 2016-06-04 NOTE — Telephone Encounter (Signed)
LMOM

## 2016-06-04 NOTE — Telephone Encounter (Signed)
Patient returned call.  She was notified that she does not have a UTI and to follow up with her GYN.  Patient voiced understanding.

## 2016-06-04 NOTE — Telephone Encounter (Signed)
-----   Message from Nori Riis, PA-C sent at 06/04/2016  8:09 AM EDT ----- Please notify the patient that she does not have an UTI.  She may discontinue her antibiotic and she needs to see her gynecologist.

## 2016-06-05 ENCOUNTER — Ambulatory Visit: Payer: Medicaid Other

## 2016-06-05 ENCOUNTER — Telehealth: Payer: Self-pay

## 2016-06-05 NOTE — Telephone Encounter (Signed)
Opened in error

## 2016-06-10 ENCOUNTER — Ambulatory Visit: Payer: Medicaid Other | Admitting: Obstetrics and Gynecology

## 2016-06-16 ENCOUNTER — Telehealth: Payer: Self-pay

## 2016-06-16 DIAGNOSIS — R102 Pelvic and perineal pain: Secondary | ICD-10-CM

## 2016-06-16 NOTE — Telephone Encounter (Signed)
Pt called c/o lower back pain and pelvic pain. Pt stated that she went to see GYN. Per pt GYN stated there was nothing wrong from their stand point. Please advise.

## 2016-06-16 NOTE — Telephone Encounter (Signed)
Spoke with pt in reference to pelvic pain. Made aware Larene Beach recommends PT. Pt voiced understanding. Orders placed.

## 2016-06-16 NOTE — Telephone Encounter (Signed)
We could not find an urological reason for her symptoms at this time.  At this time, I would suggest that she be evaluated through physical therapy for her pelvic pain.

## 2016-06-22 ENCOUNTER — Ambulatory Visit: Payer: Medicaid Other | Admitting: Urology

## 2016-06-26 ENCOUNTER — Telehealth: Payer: Self-pay | Admitting: Obstetrics and Gynecology

## 2016-06-26 NOTE — Telephone Encounter (Signed)
She went to Dr Larene Beach (urologist)... She still is bleeding and hurting, told her to come back to GYN// she wants some advice? She thinking it has something to do with there kindeys, dr said possibly her IUD is the cause of pain.

## 2016-06-29 NOTE — Telephone Encounter (Signed)
Pt has appt made

## 2016-07-06 ENCOUNTER — Encounter: Payer: Self-pay | Admitting: Obstetrics and Gynecology

## 2016-07-17 ENCOUNTER — Ambulatory Visit: Payer: Medicaid Other | Admitting: Obstetrics and Gynecology

## 2016-07-30 ENCOUNTER — Encounter: Payer: Self-pay | Admitting: Obstetrics and Gynecology

## 2016-07-30 ENCOUNTER — Ambulatory Visit (INDEPENDENT_AMBULATORY_CARE_PROVIDER_SITE_OTHER): Payer: Medicaid Other | Admitting: Obstetrics and Gynecology

## 2016-07-30 VITALS — BP 140/104 | HR 88 | Ht 65.0 in | Wt 166.0 lb

## 2016-07-30 DIAGNOSIS — I10 Essential (primary) hypertension: Secondary | ICD-10-CM

## 2016-07-30 DIAGNOSIS — R5383 Other fatigue: Secondary | ICD-10-CM

## 2016-07-30 DIAGNOSIS — N921 Excessive and frequent menstruation with irregular cycle: Secondary | ICD-10-CM | POA: Diagnosis not present

## 2016-07-30 DIAGNOSIS — Z975 Presence of (intrauterine) contraceptive device: Secondary | ICD-10-CM

## 2016-07-30 MED ORDER — HYDROCHLOROTHIAZIDE 25 MG PO TABS: 25.0000 mg | ORAL_TABLET | Freq: Every day | ORAL | 3 refills | Status: DC

## 2016-07-30 MED ORDER — AMLODIPINE BESYLATE 5 MG PO TABS: 5.0000 mg | ORAL_TABLET | Freq: Every day | ORAL | 6 refills | Status: DC

## 2016-07-30 NOTE — Progress Notes (Signed)
``  Subjective:     Patient ID: Sonya Bauer, female   DOB: 1991-03-11, 25 y.o.   MRN: [id number]  HPI Reports BP staying elevated and having bad headache today. Still bleeding daily since IUD inserted. States urologists feels like pain and problems are steming from IUD not renal stones. Feels tired all the time.  Review of Systems Negative except stated above in HPI    Objective:   Physical Exam A&O x4 Well groomed female Blood pressure (!) 140/104, pulse 88, height 5\' 5"  (1.651 m), weight 166 lb (75.3 kg), not currently breastfeeding. Abdomen soft and non-tender Negative CVA Pelvic exam: normal external genitalia, vulva, vagina, cervix, uterus and adnexa, dark brown bloody discharge noted, IUD grasped with sponge clamp and removed without difficulty.    Assessment:     LLQ pain with back pain Breakthrough bleeding with IUD IUD removal Hypertension Renal stones    Plan:     IUD removed without difficulty. Will you condoms for now. BP pill changed to Losartin and HCTZ- will have her return in 1 week for BP check.  Laylia Mui [place], CNM

## 2016-07-31 ENCOUNTER — Other Ambulatory Visit: Payer: Self-pay | Admitting: Obstetrics and Gynecology

## 2016-07-31 DIAGNOSIS — E559 Vitamin D deficiency, unspecified: Secondary | ICD-10-CM | POA: Insufficient documentation

## 2016-07-31 LAB — B12 AND FOLATE PANEL
Folate: 11.2 ng/mL (ref >3.0)
VITAMIN B 12: 305 pg/mL (ref 211–946)

## 2016-07-31 LAB — FSH/LH
FSH: 10.8 m[IU]/mL
LH: 6.6 m[IU]/mL

## 2016-07-31 LAB — THYROID PANEL WITH TSH
FREE THYROXINE INDEX: 1.8 (ref 1.2–4.9)
T3 Uptake Ratio: 25 % (ref 24–39)
T4 TOTAL: 7 ug/dL (ref 4.5–12.0)
TSH: 1.29 u[IU]/mL (ref 0.450–4.500)

## 2016-07-31 LAB — MAGNESIUM: MAGNESIUM: 2.1 mg/dL (ref 1.6–2.3)

## 2016-07-31 LAB — VITAMIN D 25 HYDROXY (VIT D DEFICIENCY, FRACTURES): Vit D, 25-Hydroxy: 21.9 ng/mL — ABNORMAL LOW (ref 30.0–100.0)

## 2016-07-31 LAB — FERRITIN: FERRITIN: 104 ng/mL (ref 15–150)

## 2016-07-31 LAB — BETA HCG QUANT (REF LAB): hCG Quant: <1 m[IU]/mL

## 2016-07-31 MED ORDER — VITAMIN D (ERGOCALCIFEROL) 1.25 MG (50000 UNIT) PO CAPS: 50000.0000 [IU] | ORAL_CAPSULE | ORAL | 2 refills | Status: DC

## 2016-08-03 ENCOUNTER — Encounter: Payer: Self-pay | Admitting: *Deleted

## 2016-08-05 ENCOUNTER — Ambulatory Visit (INDEPENDENT_AMBULATORY_CARE_PROVIDER_SITE_OTHER): Payer: Medicaid Other | Admitting: Obstetrics and Gynecology

## 2016-08-05 VITALS — BP 109/76 | HR 88 | Ht 65.0 in | Wt 166.0 lb

## 2016-08-05 DIAGNOSIS — R030 Elevated blood-pressure reading, without diagnosis of hypertension: Secondary | ICD-10-CM | POA: Diagnosis not present

## 2016-08-05 NOTE — Progress Notes (Signed)
Pt presents today for BP check after being started on new BP medication. Pt states that she was started on Losartan (note states this also) however last note also shows that pt was prescribed Norvasc. Will have [facility] clarify and send in additional scripts as pt states she is doing well new medication and denies side effects. Pt with BP readings today at 109/76 (Left) and 125/78 (Right).

## 2016-08-10 NOTE — Progress Notes (Signed)
I have reviewed the record as documented by [place], CMA, and concur with patient management and plan.  Rubie Maid, MD [facility]

## 2016-08-21 ENCOUNTER — Encounter: Payer: Self-pay | Admitting: Obstetrics and Gynecology

## 2016-08-28 ENCOUNTER — Telehealth: Payer: Self-pay | Admitting: Obstetrics and Gynecology

## 2016-08-28 NOTE — Telephone Encounter (Signed)
PT CALLED AND SHE WANTED YOU TO CALL HER BACK SHE IS HAVING A RECENT PROBLEM AND WOULD LIKE TO TALK TO YOU ABOUT IT.

## 2016-08-31 NOTE — Telephone Encounter (Signed)
Pt went to [facility] was dx with endometriosis, wants to know what she can do for the pain, or what do you suggest for her, pls advise

## 2016-09-01 NOTE — Telephone Encounter (Signed)
There are a couple of things she can do. I would actually like for her to come in and talk to Dr D as he is great at treating endometriosis.

## 2016-09-07 NOTE — Telephone Encounter (Signed)
Called pt and left detailed message to make appt with Dr. Keturah Barre

## 2016-09-09 ENCOUNTER — Encounter: Payer: Self-pay | Admitting: Obstetrics and Gynecology

## 2016-09-09 ENCOUNTER — Ambulatory Visit (INDEPENDENT_AMBULATORY_CARE_PROVIDER_SITE_OTHER): Payer: Medicaid Other | Admitting: Obstetrics and Gynecology

## 2016-09-09 ENCOUNTER — Ambulatory Visit
Admission: EM | Admit: 2016-09-09 | Discharge: 2016-09-09 | Disposition: A | Discharge status: Home / Self Care | Payer: Medicaid Other | Attending: Family Medicine | Admitting: Family Medicine

## 2016-09-09 VITALS — BP 133/91 | HR 103 | Ht 65.0 in | Wt 166.2 lb

## 2016-09-09 DIAGNOSIS — E559 Vitamin D deficiency, unspecified: Secondary | ICD-10-CM | POA: Diagnosis not present

## 2016-09-09 DIAGNOSIS — N941 Unspecified dyspareunia: Secondary | ICD-10-CM | POA: Diagnosis not present

## 2016-09-09 DIAGNOSIS — N946 Dysmenorrhea, unspecified: Secondary | ICD-10-CM

## 2016-09-09 DIAGNOSIS — N920 Excessive and frequent menstruation with regular cycle: Secondary | ICD-10-CM | POA: Diagnosis not present

## 2016-09-09 DIAGNOSIS — R109 Unspecified abdominal pain: Secondary | ICD-10-CM

## 2016-09-09 DIAGNOSIS — J029 Acute pharyngitis, unspecified: Secondary | ICD-10-CM | POA: Diagnosis not present

## 2016-09-09 DIAGNOSIS — Z842 Family history of other diseases of the genitourinary system: Secondary | ICD-10-CM | POA: Diagnosis not present

## 2016-09-09 DIAGNOSIS — R05 Cough: Secondary | ICD-10-CM | POA: Diagnosis not present

## 2016-09-09 DIAGNOSIS — I10 Essential (primary) hypertension: Secondary | ICD-10-CM | POA: Diagnosis not present

## 2016-09-09 DIAGNOSIS — J069 Acute upper respiratory infection, unspecified: Secondary | ICD-10-CM | POA: Diagnosis not present

## 2016-09-09 DIAGNOSIS — B9789 Other viral agents as the cause of diseases classified elsewhere: Secondary | ICD-10-CM

## 2016-09-09 DIAGNOSIS — J028 Acute pharyngitis due to other specified organisms: Secondary | ICD-10-CM

## 2016-09-09 DIAGNOSIS — F329 Major depressive disorder, single episode, unspecified: Secondary | ICD-10-CM | POA: Insufficient documentation

## 2016-09-09 LAB — RAPID STREP SCREEN (MED CTR MEBANE ONLY): Streptococcus, Group A Screen (Direct): NEGATIVE

## 2016-09-09 LAB — POCT URINALYSIS DIPSTICK
Bilirubin, UA: NEGATIVE
Blood, UA: NEGATIVE
GLUCOSE UA: NEGATIVE
KETONES UA: NEGATIVE
LEUKOCYTES UA: NEGATIVE
Nitrite, UA: NEGATIVE
PROTEIN UA: NEGATIVE
Spec Grav, UA: 1.01
Urobilinogen, UA: NEGATIVE
pH, UA: 7.5

## 2016-09-09 LAB — RAPID INFLUENZA A&B ANTIGENS
Influenza A (ARMC): NEGATIVE
Influenza B (ARMC): NEGATIVE

## 2016-09-09 MED ORDER — GUAIFENESIN-CODEINE 100-10 MG/5ML PO SOLN: ORAL | 0 refills | Status: DC

## 2016-09-09 NOTE — ED Triage Notes (Signed)
Pt with body aches, chills, sore throat, headache starting yesterday. Coughing up yellow-green. Pain 8/10

## 2016-09-09 NOTE — ED Provider Notes (Signed)
[facility]    CSN: [id number] Arrival date & time: 09/09/16  1020     History   Chief Complaint Chief Complaint  Patient presents with  . Sore Throat    HPI Sonya Bauer is a 25 y.o. female.   The history is provided by the patient.  URI  Presenting symptoms: cough, fatigue, fever and sore throat   Severity:  Moderate Onset quality:  Sudden Duration:  1 day Timing:  Constant Progression:  Worsening Chronicity:  New Relieved by:  Nothing Ineffective treatments:  OTC medications Associated symptoms: no arthralgias, no headaches, no myalgias, no neck pain, no sinus pain, no sneezing, no swollen glands and no wheezing   Risk factors: not elderly, no chronic cardiac disease, no chronic kidney disease, no chronic respiratory disease, no diabetes mellitus, no immunosuppression, no recent illness, no recent travel and no sick contacts     Past Medical History:  Diagnosis Date  . Depression   . Headache   . Heartburn   . Hypertension   . Pregnancy induced hypertension   . 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 Vitamin D deficiency 07/31/2016  . Vaginal delivery 07/03/2015  . 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 Elevated blood pressure reading 05/08/2015    History reviewed. No pertinent surgical history.  OB History    Gravida Para Term Preterm AB Living   1 1 1     1    SAB TAB Ectopic Multiple Live Births         0 1       Home Medications    Prior to Admission medications   Medication Sig Start Date End Date Taking? Authorizing Provider  acyclovir (ZOVIRAX) 400 MG tablet  07/16/16   Historical Provider, MD  amLODipine (NORVASC) 5 MG tablet Take 1 tablet (5 mg total) by mouth daily. 07/30/16   Melody N Shambley, CNM  guaiFENesin-codeine 100-10 MG/5ML syrup 10 ml po qhs prn 09/09/16   Norval Gable, MD  hydrochlorothiazide (HYDRODIURIL) 25 MG tablet Take 1 tablet (25 mg total) by mouth daily. 07/30/16   Melody N  Shambley, CNM  Vitamin D, Ergocalciferol, (DRISDOL) 50000 units CAPS capsule TAKE 1 CAPSULE BY MOUTH TWICE WEEKLY 08/03/16   Melody Rockney Ghee, CNM    Family History Family History  Problem Relation Age of Onset  . Hypertension Father   . Kidney disease Neg Hx   . Bladder Cancer Neg Hx     Social History Social History  Substance Use Topics  . Smoking status: Never Smoker  . Smokeless tobacco: Never Used  . Alcohol use No     Allergies   Patient has no known allergies.   Review of Systems Review of Systems  Constitutional: Positive for fatigue and fever.  HENT: Positive for sore throat. Negative for sinus pain and sneezing.   Respiratory: Positive for cough. Negative for wheezing.   Musculoskeletal: Negative for arthralgias, myalgias and neck pain.  Neurological: Negative for headaches.     Physical Exam Triage Vital Signs ED Triage Vitals  Enc Vitals Group     BP 09/09/16 1146 132/90     Pulse Rate 09/09/16 1146 97     Resp 09/09/16 1146 18     Temp 09/09/16 1146 98.4 F (36.9 C)     Temp Source 09/09/16 1146 Oral     SpO2 09/09/16 1146 100 %     Weight 09/09/16 1148 166 lb (75.3 kg)  Height 09/09/16 1148 5\' 5"  (1.651 m)     Head Circumference --      Peak Flow --      Pain Score 09/09/16 1149 8     Pain Loc --      Pain Edu? --      Excl. in [place]? --    No data found.   Updated Vital Signs BP 132/90 (BP Location: Right Arm)   Pulse 97   Temp 98.4 F (36.9 C) (Oral)   Resp 18   Ht 5\' 5"  (1.651 m)   Wt 166 lb (75.3 kg)   LMP 08/22/2016   SpO2 100%   BMI 27.62 kg/m   Visual Acuity Right Eye Distance:   Left Eye Distance:   Bilateral Distance:    Right Eye Near:   Left Eye Near:    Bilateral Near:     Physical Exam  Constitutional: She appears well-developed and well-nourished. No distress.  HENT:  Head: Normocephalic and atraumatic.  Right Ear: Tympanic membrane, external ear and ear canal normal.  Left Ear: Tympanic membrane,  external ear and ear canal normal.  Nose: No mucosal edema, rhinorrhea, nose lacerations, sinus tenderness, nasal deformity, septal deviation or nasal septal hematoma. No epistaxis.  No foreign bodies. Right sinus exhibits no maxillary sinus tenderness and no frontal sinus tenderness. Left sinus exhibits no maxillary sinus tenderness and no frontal sinus tenderness.  Mouth/Throat: Uvula is midline and mucous membranes are normal. Posterior oropharyngeal erythema present. No oropharyngeal exudate. No tonsillar exudate.  Eyes: Conjunctivae and EOM are normal. Pupils are equal, round, and reactive to light. Right eye exhibits no discharge. Left eye exhibits no discharge. No scleral icterus.  Neck: Normal range of motion. Neck supple. No thyromegaly present.  Cardiovascular: Normal rate, regular rhythm and normal heart sounds.   Pulmonary/Chest: Effort normal and breath sounds normal. No respiratory distress. She has no wheezes. She has no rales.  Lymphadenopathy:    She has no cervical adenopathy.  Skin: She is not diaphoretic.  Nursing note and vitals reviewed.    UC Treatments / Results  Labs (all labs ordered are listed, but only abnormal results are displayed) Labs Reviewed  RAPID STREP SCREEN (NOT AT [facility])  RAPID INFLUENZA A&B ANTIGENS ([facility] ONLY)  CULTURE, GROUP A STREP [facility])    EKG  EKG Interpretation None       Radiology No results found.  Procedures Procedures (including critical care time)  Medications Ordered in UC Medications - No data to display   Initial Impression / Assessment and Plan / UC Course  I have reviewed the triage vital signs and the nursing notes.  Pertinent labs & imaging results that were available during my care of the patient were reviewed by me and considered in my medical decision making (see chart for details).  Clinical Course       Final Clinical Impressions(s) / UC Diagnoses   Final diagnoses:  Acute pharyngitis due to other  specified organisms  Viral URI with cough    New Prescriptions Discharge Medication List as of 09/09/2016 12:30 PM    START taking these medications   Details  guaiFENesin-codeine 100-10 MG/5ML syrup 10 ml po qhs prn, Print       1. Lab results and diagnosis reviewed with patient 2. rx as per orders above; reviewed possible side effects, interactions, risks and benefits  3. Recommend supportive treatment with otc analgesics, fluids 4. Follow-up prn if symptoms worsen or don't improve   Ras Kollman,  MD 09/09/16 1301

## 2016-09-09 NOTE — Patient Instructions (Addendum)
1. Schedule laparoscopy with peritoneal biopsies 2. Return in 1 week prior to surgery for preoperative appointment 3. Continue taking ibuprofen as needed for dysmenorrhea    Endometriosis Endometriosis is a condition in which the tissue that lines the uterus (endometrium) grows outside of its normal location. The tissue may grow in many locations close to the uterus, but it commonly grows on the ovaries, fallopian tubes, vagina, or bowel. When the uterus sheds the endometrium every menstrual cycle, there is bleeding wherever the endometrial tissue is located. This can cause pain because blood is irritating to tissues that are not normally exposed to it. What are the causes? The cause of endometriosis is not known. What increases the risk? You may be more likely to develop endometriosis if you:  Have a family history of endometriosis.  Have never given birth.  Started your period at age 6 or younger.  Have high levels of estrogen in your body.  Were exposed to a certain medicine (diethylstilbestrol) before you were born (in utero).  Had low birth weight.  Were born as a twin, triplet, or other multiple.  Have a BMI of less than 25. BMI is an estimate of body fat and is calculated from height and weight. What are the signs or symptoms? Often, there are no symptoms of this condition. If you do have symptoms, they may:  Vary depending on where your endometrial tissue is growing.  Occur during your menstrual period (most common) or midcycle.  Come and go, or you may go months with no symptoms at all.  Stop with menopause. Symptoms may include:  Pain in the back or abdomen.  Heavier bleeding during periods.  Pain during sex.  Painful bowel movements.  Infertility.  Pelvic pain.  Bleeding more than once a month. How is this diagnosed? This condition is diagnosed based on your symptoms and a physical exam. You may have tests, such as:  Blood tests and urine tests.  These may be done to help rule out other possible causes of your symptoms.  Ultrasound, to look for abnormal tissues.  An X-ray of the lower bowel (barium enema).  An ultrasound that is done through the vagina (transvaginally).  CT scan.  MRI.  Laparoscopy. In this procedure, a lighted, pencil-sized instrument called a laparoscope is inserted into your abdomen through an incision. The laparoscope allows your health care provider to look at the organs inside your body and check for abnormal tissue to confirm the diagnosis. If abnormal tissue is found, your health care provider may remove a small piece of tissue (biopsy) to be examined under a microscope. How is this treated? Treatment for this condition may include:  Medicines to relieve pain, such as NSAIDs.  Hormone therapy. This involves using artificial (synthetic) hormones to reduce endometrial tissue growth. Your health care provider may recommend using a hormonal form of birth control, or other medicines.  Surgery. This may be done to remove abnormal endometrial tissue.  In some cases, tissue may be removed using a laparoscope and a laser (laparoscopic laser treatment).  In severe cases, surgery may be done to remove the fallopian tubes, uterus, and ovaries (hysterectomy). Follow these instructions at home:  Take over-the-counter and prescription medicines only as told by your health care provider.  Do not drive or use heavy machinery while taking prescription pain medicine.  Try to avoid activities that cause pain, including sexual activity.  Keep all follow-up visits as told by your health care provider. This is important. Contact a  health care provider if:  You have pain in the area between your hip bones (pelvic area) that occurs:  Before, during, or after your period.  In between your period and gets worse during your period.  During or after sex.  With bowel movements or urination, especially during your  period.  You have problems getting pregnant.  You have a fever. Get help right away if:  You have severe pain that does not get better with medicine.  You have severe nausea and vomiting, or you cannot eat without vomiting.  You have pain that affects only the lower, right side of your abdomen.  You have abdominal pain that gets worse.  You have abdominal swelling.  You have blood in your stool. This information is not intended to replace advice given to you by your health care provider. Make sure you discuss any questions you have with your health care provider. Document Released: 09/25/2000 Document Revised: 07/03/2016 Document Reviewed: 02/29/2016 Elsevier Interactive Patient Education  2017 [employer]   Diagnostic Laparoscopy A diagnostic laparoscopy is a procedure to diagnose diseases in the abdomen. During the procedure, a thin, lighted, pencil-sized instrument called a laparoscope is inserted into the abdomen through an incision. The laparoscope allows your health care provider to look at the organs inside your body. Tell a health care provider about:  Any allergies you have.  All medicines you are taking, including vitamins, herbs, eye drops, creams, and over-the-counter medicines.  Any problems you or family members have had with anesthetic medicines.  Any blood disorders you have.  Any surgeries you have had.  Any medical conditions you have. What are the risks? Generally, this is a safe procedure. However, problems can occur, which may include:  Infection.  Bleeding.  Damage to other organs.  Allergic reaction to the anesthetics used during the procedure. What happens before the procedure?  Do not eat or drink anything after midnight on the night before the procedure or as directed by your health care provider.  Ask your health care provider about:  Changing or stopping your regular medicines.  Taking medicines such as aspirin and ibuprofen.  These medicines can thin your blood. Do not take these medicines before your procedure if your health care provider instructs you not to.  Plan to have someone take you home after the procedure. What happens during the procedure?  You may be given a medicine to help you relax (sedative).  You will be given a medicine to make you sleep (general anesthetic).  Your abdomen will be inflated with a gas. This will make your organs easier to see.  Small incisions will be made in your abdomen.  A laparoscope and other small instruments will be inserted into the abdomen through the incisions.  A tissue sample may be removed from an organ in the abdomen for examination.  The instruments will be removed from the abdomen.  The gas will be released.  The incisions will be closed with stitches (sutures). What happens after the procedure? Your blood pressure, heart rate, breathing rate, and blood oxygen level will be monitored often until the medicines you were given have worn off. This information is not intended to replace advice given to you by your health care provider. Make sure you discuss any questions you have with your health care provider. Document Released: 01/04/2001 Document Revised: 02/06/2016 Document Reviewed: 05/11/2014 Elsevier Interactive Patient Education  2017 [employer].

## 2016-09-09 NOTE — Progress Notes (Signed)
GYN ENCOUNTER NOTE  Subjective:       Sonya Bauer is a 25 y.o. G34P1001 female is here for gynecologic evaluation of the following issues:  1. Endometriosis  Patient states that she had several hospital visits with subsequent diagnosis of endometriosis based on history.  Menarche age 73. Intervals: Every 3 weeks Duration: 7 days, heavy Dysmenorrhea: Severe-central cramping along with low back pain with radiation into buttocks and thighs; degree of pain 10/10 Dyspareunia: Deep thrusting, alternatives frequency of intercourse Past history of OCP use for 6 years with improvement in symptomatology Family history of endometriosis in mother, diagnosed at age 25 Family history of severe dysmenorrhea in sister, without diagnosis of endometriosis     Gynecologic History Patient's last menstrual period was 08/22/2016. Contraception: none  Obstetric History OB History  Gravida Para Term Preterm AB Living  1 1 1     1   SAB TAB Ectopic Multiple Live Births        0 1    # Outcome Date GA Lbr Len/2nd Weight Sex Delivery Anes PTL Lv  1 Term 07/02/15 [redacted]w[redacted]d 05:50 / 00:14 6 lb 6.7 oz (2.91 kg) F Vag-Spont EPI  LIV      Past Medical History:  Diagnosis Date  . Depression   . Headache   . Heartburn   . Hypertension   . Pregnancy induced hypertension   . 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  Current Outpatient Prescriptions on File Prior to Visit  Medication Sig Dispense Refill  . acyclovir (ZOVIRAX) 400 MG tablet   6  . amLODipine (NORVASC) 5 MG tablet Take 1 tablet (5 mg total) by mouth daily. 30 tablet 6  . hydrochlorothiazide (HYDRODIURIL) 25 MG tablet Take 1 tablet (25 mg total) by mouth daily. 30 tablet 3  . Vitamin D, Ergocalciferol, (DRISDOL) 50000 units CAPS capsule TAKE 1 CAPSULE BY MOUTH TWICE WEEKLY 25 capsule 2   No current facility-administered medications on file prior to visit.     No Known Allergies  Social History   Social History  .  Marital status: Single    Spouse name: N/A  . Number of children: N/A  . Years of education: N/A   Occupational History  . Not on file.   Social History Main Topics  . Smoking status: Never Smoker  . Smokeless tobacco: Never Used  . Alcohol use No  . Drug use: No  . Sexual activity: Yes    Birth control/ protection: None, Condom     Comment: mirena   Other Topics Concern  . Not on file   Social History Narrative  . No narrative on file    Family History  Problem Relation Age of Onset  . Hypertension Father   . Kidney disease Neg Hx   . Bladder Cancer Neg Hx     The following portions of the patient's history were reviewed and updated as appropriate: allergies, current medications, past family history, past medical history, past social history, past surgical history and problem list.  Review of Systems Review of Systems - per history of present illness  Objective:   BP (!) 133/91   Pulse (!) 103   Ht 5\' 5"  (1.651 m)   Wt 166 lb 3.2 oz (75.4 kg)   LMP 08/22/2016   Breastfeeding? No   BMI 27.66 kg/m  CONSTITUTIONAL: Well-developed, well-nourished female in no acute distress.  HENT:  Normocephalic, atraumatic.  NECK: Not examined SKIN: Skin is warm and  dry. No rash noted. Not diaphoretic. No erythema. No pallor. [place]: Alert and oriented to person, place, and time. PSYCHIATRIC: Normal mood and affect. Normal behavior. Normal judgment and thought content. CARDIOVASCULAR:Not Examined RESPIRATORY: Not Examined BREASTS: Not Examined ABDOMEN: Soft, non distended; Non tender.  No Organomegaly. PELVIC:  External Genitalia: Normal  BUS: Normal  Vagina: Normal  Cervix: 2/4 cervical motion tenderness  Uterus: Normal size, shape,consistency, mobile, midplane, 2/4 tender  Adnexa: Normal left adnexa; 2/4 tenderness right adnexa without palpable mass  RV: Normal external exam  Bladder: Nontender MUSCULOSKELETAL: Normal range of motion. No tenderness.  No cyanosis,  clubbing, or edema.     Assessment:   1. Flank pain; History of kidney stones - POCT urinalysis dipstick - Urine culture  2. Dysmenorrhea  3. Menorrhagia with regular cycle  4. Female dyspareunia  5. Hypertension, unspecified type  6. Family history of endometriosis in first degree relative     Plan:   1. Laparoscopy with excision and fulguration of endometriosis 2. Likely follow-up with Depo-Lupron therapy and Mirena IUD 3. Return for preoperative appointment the week before surgery 4. Ibuprofen 800 mg 3 times a day as needed for pain along with Tylenol when necessary  A total of 25 minutes were spent face-to-face with the patient during this encounter and over half of that time involved counseling and coordination of care.  Brayton Mars, MD  Note: This dictation was prepared with Dragon dictation along with smaller phrase technology. Any transcriptional errors that result from this process are unintentional.

## 2016-09-11 LAB — URINE CULTURE

## 2016-09-12 ENCOUNTER — Telehealth: Payer: Self-pay | Admitting: Emergency Medicine

## 2016-09-12 LAB — CULTURE, GROUP A STREP (THRC)

## 2016-09-12 NOTE — Telephone Encounter (Signed)
Patient notified of her throat culture result.  Patient to follow-up here or with her PCP if her symptoms worsen. Patient verbalized understanding.

## 2016-09-15 ENCOUNTER — Ambulatory Visit
Admission: EM | Admit: 2016-09-15 | Discharge: 2016-09-15 | Disposition: A | Discharge status: Home / Self Care | Payer: Medicaid Other | Attending: Family Medicine | Admitting: Family Medicine

## 2016-09-15 DIAGNOSIS — R05 Cough: Secondary | ICD-10-CM | POA: Diagnosis not present

## 2016-09-15 DIAGNOSIS — R059 Cough, unspecified: Secondary | ICD-10-CM

## 2016-09-15 MED ORDER — PREDNISONE 50 MG PO TABS: ORAL_TABLET | ORAL | 0 refills | Status: DC

## 2016-09-15 MED ORDER — HYDROCOD POLST-CPM POLST ER 10-8 MG/5ML PO SUER: 5.0000 mL | Freq: Two times a day (BID) | ORAL | 0 refills | Status: DC | PRN

## 2016-09-15 NOTE — ED Provider Notes (Signed)
[facility]    CSN: [id number] Arrival date & time: 09/15/16  1624     History   Chief Complaint Chief Complaint  Patient presents with  . Cough   HPI  25 year old female who was recently seen for acute pharyngitis/viral URI presents with complaints of cough.  Patient states that she's had ongoing cough. Cough is severe and unrelenting. No associated fevers or chills. Cough is dry. No known exacerbating or relieving factors. She did not take her cough medication that was prescribed previously. She does have a sick child at home. Her daughter is improving. No other complaints or concerns at this time.  Past Medical History:  Diagnosis Date  . Depression   . Headache   . Heartburn   . Hypertension   . Pregnancy induced hypertension   . 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 Family history of endometriosis in first degree relative 09/09/2016  . Hypertension 09/09/2016  . Female dyspareunia 09/09/2016  . Menorrhagia with regular cycle 09/09/2016  . Vitamin D deficiency 07/31/2016  . Vaginal delivery 07/03/2015  . 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 Elevated blood pressure reading 05/08/2015    History reviewed. No pertinent surgical history.  OB History    Gravida Para Term Preterm AB Living   1 1 1     1    SAB TAB Ectopic Multiple Live Births         0 1     Home Medications    Prior to Admission medications   Medication Sig Start Date End Date Taking? Authorizing Provider  acyclovir (ZOVIRAX) 400 MG tablet  07/16/16  Yes Historical Provider, MD  amLODipine (NORVASC) 5 MG tablet Take 1 tablet (5 mg total) by mouth daily. 07/30/16  Yes Melody N Shambley, CNM  hydrochlorothiazide (HYDRODIURIL) 25 MG tablet Take 1 tablet (25 mg total) by mouth daily. 07/30/16  Yes Melody N Shambley, CNM  Vitamin D, Ergocalciferol, (DRISDOL) 50000 units CAPS capsule TAKE 1 CAPSULE BY MOUTH TWICE WEEKLY 08/03/16  Yes Melody N Shambley, CNM    chlorpheniramine-HYDROcodone (TUSSIONEX PENNKINETIC ER) 10-8 MG/5ML SUER Take 5 mLs by mouth every 12 (twelve) hours as needed for cough. 09/15/16   Coral Spikes, DO  predniSONE (DELTASONE) 50 MG tablet 1 tablet daily x 5 days. 09/15/16   Coral Spikes, DO   Family History Family History  Problem Relation Age of Onset  . Hypertension Father   . Kidney disease Neg Hx   . Bladder Cancer Neg Hx    Social History Social History  Substance Use Topics  . Smoking status: Never Smoker  . Smokeless tobacco: Never Used  . Alcohol use No   Allergies   Patient has no known allergies.  Review of Systems Review of Systems  Constitutional: Negative for fever.  Respiratory: Positive for cough.    Physical Exam Triage Vital Signs ED Triage Vitals  Enc Vitals Group     BP 09/15/16 1654 (!) 137/106     Pulse Rate 09/15/16 1654 84     Resp 09/15/16 1654 18     Temp 09/15/16 1654 98.9 F (37.2 C)     Temp Source 09/15/16 1654 Oral     SpO2 09/15/16 1654 99 %     Weight 09/15/16 1656 166 lb (75.3 kg)     Height 09/15/16 1656 5\' 5"  (1.651 m)     Head Circumference --      Peak Flow --  Pain Score 09/15/16 1656 0     Pain Loc --      Pain Edu? --      Excl. in [place]? --    Updated Vital Signs BP (!) 137/106 (BP Location: Left Arm)   Pulse 84   Temp 98.9 F (37.2 C) (Oral)   Resp 18   Ht 5\' 5"  (1.651 m)   Wt 166 lb (75.3 kg)   LMP 08/22/2016   SpO2 99%   BMI 27.62 kg/m     Physical Exam  Constitutional: She is oriented to person, place, and time. She appears well-developed.  HENT:  Mouth/Throat: Oropharynx is clear and moist.  Cardiovascular: Normal rate and regular rhythm.   Pulmonary/Chest: Effort normal and breath sounds normal.  Neurological: She is alert and oriented to person, place, and time.  Psychiatric: She has a normal mood and affect.  Vitals reviewed.  UC Treatments / Results  Labs (all labs ordered are listed, but only abnormal results are displayed) Labs  Reviewed - No data to display  EKG  EKG Interpretation None      Radiology No results found.  Procedures Procedures (including critical care time)  Medications Ordered in UC Medications - No data to display   Initial Impression / Assessment and Plan / UC Course  I have reviewed the triage vital signs and the nursing notes.  Pertinent labs & imaging results that were available during my care of the patient were reviewed by me and considered in my medical decision making (see chart for details).  Clinical Course   25 year old female presents with postinfectious cough. Treating with Tussionex and prednisone.  Final Clinical Impressions(s) / UC Diagnoses   Final diagnoses:  Cough   New Prescriptions Discharge Medication List as of 09/15/2016  5:13 PM    START taking these medications   Details  chlorpheniramine-HYDROcodone (TUSSIONEX PENNKINETIC ER) 10-8 MG/5ML SUER Take 5 mLs by mouth every 12 (twelve) hours as needed for cough., Starting Tue 09/15/2016, Print    predniSONE (DELTASONE) 50 MG tablet 1 tablet daily x 5 days., Print         Coral Spikes, DO 09/15/16 1715

## 2016-09-15 NOTE — Discharge Instructions (Signed)
Take the medication as prescribed. ° °Take care ° °Dr. Zachry Hopfensperger  °

## 2016-09-15 NOTE — ED Triage Notes (Addendum)
Pt c/o a cough that is productive and her eyes are watery and itchy. Mucus is green, and she has had the cough for about a week.

## 2016-09-18 ENCOUNTER — Telehealth: Payer: Self-pay

## 2016-09-18 NOTE — Telephone Encounter (Signed)
Courtesy call back completed today after patient's visit at [facility]. Patient improved and will call back with any questions or concerns.  

## 2016-09-20 ENCOUNTER — Emergency Department: Payer: Medicaid Other

## 2016-09-20 ENCOUNTER — Encounter: Payer: Self-pay | Admitting: Emergency Medicine

## 2016-09-20 ENCOUNTER — Emergency Department
Admission: EM | Admit: 2016-09-20 | Discharge: 2016-09-20 | Disposition: A | Discharge status: Home / Self Care | Payer: Medicaid Other | Attending: Emergency Medicine | Admitting: Emergency Medicine

## 2016-09-20 DIAGNOSIS — N200 Calculus of kidney: Secondary | ICD-10-CM | POA: Insufficient documentation

## 2016-09-20 DIAGNOSIS — R319 Hematuria, unspecified: Secondary | ICD-10-CM | POA: Diagnosis present

## 2016-09-20 DIAGNOSIS — Z79899 Other long term (current) drug therapy: Secondary | ICD-10-CM | POA: Diagnosis not present

## 2016-09-20 DIAGNOSIS — I10 Essential (primary) hypertension: Secondary | ICD-10-CM | POA: Insufficient documentation

## 2016-09-20 LAB — CBC
HEMATOCRIT: 42.2 % (ref 35.0–47.0)
HEMOGLOBIN: 14.6 g/dL (ref 12.0–16.0)
MCH: 28.5 pg (ref 26.0–34.0)
MCHC: 34.6 g/dL (ref 32.0–36.0)
MCV: 82.5 fL (ref 80.0–100.0)
Platelets: 370 10*3/uL (ref 150–440)
RBC: 5.11 MIL/uL (ref 3.80–5.20)
RDW: 13.1 % (ref 11.5–14.5)
WBC: 12.3 10*3/uL — AB (ref 3.6–11.0)

## 2016-09-20 LAB — BASIC METABOLIC PANEL
ANION GAP: 10 (ref 5–15)
BUN: 21 mg/dL — ABNORMAL HIGH (ref 6–20)
CALCIUM: 9.7 mg/dL (ref 8.9–10.3)
CO2: 25 mmol/L (ref 22–32)
Chloride: 103 mmol/L (ref 101–111)
Creatinine, Ser: 0.83 mg/dL (ref 0.44–1.00)
GFR calc non Af Amer: >60 mL/min (ref >60)
Glucose, Bld: 98 mg/dL (ref 65–99)
Potassium: 3.7 mmol/L (ref 3.5–5.1)
Sodium: 138 mmol/L (ref 135–145)

## 2016-09-20 LAB — URINALYSIS, COMPLETE (UACMP) WITH MICROSCOPIC
Bilirubin Urine: NEGATIVE
Glucose, UA: NEGATIVE mg/dL
Ketones, ur: NEGATIVE mg/dL
Leukocytes, UA: NEGATIVE
Nitrite: NEGATIVE
PROTEIN: 30 mg/dL — AB
Specific Gravity, Urine: 1.023 (ref 1.005–1.030)
pH: 5 (ref 5.0–8.0)

## 2016-09-20 LAB — POCT PREGNANCY, URINE: PREG TEST UR: NEGATIVE

## 2016-09-20 MED ORDER — ONDANSETRON HCL 4 MG/2ML IJ SOLN
4.0000 mg | Freq: Once | INTRAMUSCULAR | Status: AC
  Administered 2016-09-20: 4 mg via INTRAVENOUS
  Filled 2016-09-20: qty 2

## 2016-09-20 MED ORDER — KETOROLAC TROMETHAMINE 30 MG/ML IJ SOLN
30.0000 mg | Freq: Once | INTRAMUSCULAR | Status: AC
  Administered 2016-09-20: 30 mg via INTRAVENOUS
  Filled 2016-09-20: qty 1

## 2016-09-20 MED ORDER — SODIUM CHLORIDE 0.9 % IV BOLUS (SEPSIS)
1000.0000 mL | Freq: Once | INTRAVENOUS | Status: AC
  Administered 2016-09-20: 1000 mL via INTRAVENOUS

## 2016-09-20 NOTE — Discharge Instructions (Signed)
You have been seen in the Emergency Department (ED) today for pain that we believe based on your workup, is caused by kidney stones.  As we have discussed, please drink plenty of fluids.  Please make a follow up appointment with the physician(s) listed elsewhere in this documentation.  You may take pain medication as needed but ONLY as prescribed.  Please also take your prior prescribed Flomax daily.  We also recommend that you take over-the-counter ibuprofen regularly according to label instructions over the next 5 days.  Take it with meals to minimize stomach discomfort.  Please see your doctor as soon as possible at Urology.  Return to the Emergency Department (ED) or call your doctor if you have any worsening pain, fever, painful urination, are unable to urinate, or develop other symptoms that concern you.

## 2016-09-20 NOTE — ED Notes (Signed)
Patient transported to Ultrasound 

## 2016-09-20 NOTE — ED Notes (Signed)
Patient tearful upon assessment. States "I'm just fed up with feeling this way. No one can give my an answer. This isn't normal and I just want to know what's wrong."

## 2016-09-20 NOTE — ED Triage Notes (Signed)
C/O left lower back pain.  Onset of symptoms Monday, but patient states "this has been an ongoing issue"  States initially back pain started in June. Pain possibly related to endometriosis.  Patient states pain has been worsening since Monday.  Has been taking ibuprofen and tylenol for pain.  Last took tylenol at 0900.  Denies burning or frequency with urination but does not always feel that bladder fully empties.  Did notice some blood in urine yesterday.

## 2016-09-20 NOTE — ED Notes (Signed)
Pt. Verbalizes understanding of d/c instructions and follow-up. VS stable and pain controlled per pt.  Pt. In NAD at time of d/c and denies further concerns regarding this visit. Pt. Stable at the time of departure from the unit, departing unit by the safest and most appropriate manner per that pt condition and limitations. Pt advised to return to the ED at any time for emergent concerns, or for new/worsening symptoms.   

## 2016-09-20 NOTE — ED Notes (Signed)
Patient returned from Ultrasound. 

## 2016-09-20 NOTE — ED Provider Notes (Signed)
[facility] Emergency Department Provider Note   ____________________________________________   First MD Initiated Contact with Patient 09/20/16 1240     (approximate)  I have reviewed the triage vital signs and the nursing notes.   HISTORY  Chief Complaint Back Pain and Hematuria    HPI Sonya Bauer is a 25 y.o. female here for evaluation of pain in her left flank and back which has been an ongoing issue for over 6 months.  Patient reports that she seen multiple specialists including urology, as well as obstetrics. She's been to multiple emergency rooms and she has not been able to have a firm diagnosis for the cause of her persistent severe left flank pain.  Patient reports that she's been told she has a kidney stone in her kidney, but urologist twice of told her it is not the cause of her pain and blood in her urine. In addition, she saw her physician at [facility] last week and they have recommended she have an exploratory procedure to see if she might have endometriosis causing pain.  No fevers or chills. Some nausea but no vomiting. No diarrhea. No chest pain or shortness of breath. She continues to notice a small amount of blood in her urine which is been off and on for several months. Last menstrual period was November 11, and she reports it was normal  Denies pregnancy patient also reports she has known high blood pressure and her primary care doctor is following this.  Also reports he's had recent pelvic examinations and ultrasound, they have not shown a clear cause.  Past Medical History:  Diagnosis Date  . Depression   . Headache   . Heartburn   . Hypertension   . Pregnancy induced hypertension   . 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 Family history of endometriosis in first degree relative 09/09/2016  . Hypertension 09/09/2016  . Female dyspareunia 09/09/2016  . Menorrhagia with  regular cycle 09/09/2016  . Vitamin D deficiency 07/31/2016  . Vaginal delivery 07/03/2015  . 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 Elevated blood pressure reading 05/08/2015    History reviewed. No pertinent surgical history.  Prior to Admission medications   Medication Sig Start Date End Date Taking? Authorizing Provider  acyclovir (ZOVIRAX) 400 MG tablet  07/16/16   Historical Provider, MD  amLODipine (NORVASC) 5 MG tablet Take 1 tablet (5 mg total) by mouth daily. 07/30/16   Melody N Shambley, CNM  chlorpheniramine-HYDROcodone (TUSSIONEX PENNKINETIC ER) 10-8 MG/5ML SUER Take 5 mLs by mouth every 12 (twelve) hours as needed for cough. 09/15/16   Coral Spikes, DO  hydrochlorothiazide (HYDRODIURIL) 25 MG tablet Take 1 tablet (25 mg total) by mouth daily. 07/30/16   Melody N Shambley, CNM  predniSONE (DELTASONE) 50 MG tablet 1 tablet daily x 5 days. 09/15/16   Coral Spikes, DO  Vitamin D, Ergocalciferol, (DRISDOL) 50000 units CAPS capsule TAKE 1 CAPSULE BY MOUTH TWICE WEEKLY 08/03/16   Melody Rockney Ghee, CNM    Allergies Patient has no known allergies.  Family History  Problem Relation Age of Onset  . Hypertension Father   . Kidney disease Neg Hx   . Bladder Cancer Neg Hx     Social History Social History  Substance Use Topics  . Smoking status: Never Smoker  . Smokeless tobacco: Never Used  . Alcohol use No    Review of Systems Constitutional: No fever/chills Eyes: No  visual changes. ENT: No sore throat.She did have a sore throat a few days ago but this is better. She was seen in urgent care for Cardiovascular: Denies chest pain. Respiratory: Denies shortness of breath. Gastrointestinal: No abdominal pain except in the left mid back.  No vomiting.  No diarrhea.  No constipation. Genitourinary: Negative for dysuria. No vaginal discharge or bleeding. She has noticed some blood in her urine. No abnormal odor. Musculoskeletal: Negative for back pain in the mid back, but does have severe  sharp pain located in the left mid back/flank. Skin: Negative for rash. Neurological: Negative for headaches, focal weakness or numbness.  10-point ROS otherwise negative.  ____________________________________________   PHYSICAL EXAM:  VITAL SIGNS: ED Triage Vitals  Enc Vitals Group     BP 09/20/16 1235 (!) 175/122     Pulse Rate 09/20/16 1235 (!) 108     Resp 09/20/16 1235 18     Temp --      Temp src --      SpO2 09/20/16 1235 99 %     Weight 09/20/16 1133 165 lb (74.8 kg)     Height 09/20/16 1133 5\' 5"  (1.651 m)     Head Circumference --      Peak Flow --      Pain Score 09/20/16 1133 10     Pain Loc --      Pain Edu? --      Excl. in [place]? --     Constitutional: Alert and oriented. Well appearing and in no acute distress. Somewhat tearful, reports she is frustrated with the lack of a diagnosis. Eyes: Conjunctivae are normal. PERRL. EOMI. Head: Atraumatic. Nose: No congestion/rhinnorhea. Mouth/Throat: Mucous membranes are moist.  Oropharynx non-erythematous. Neck: No stridor.   Cardiovascular: Normal rate, regular rhythm. Grossly normal heart sounds.  Good peripheral circulation. Respiratory: Normal respiratory effort.  No retractions. Lungs CTAB. Gastrointestinal: Soft and nontender. No distention. No abdominal bruits. Mild left CVA tenderness. No rebound or guarding.  Discussed pelvic exam, patient reports she saw her OB less than 2 weeks ago for same, she also had a recent pelvic exam less than a month ago and does not request or wish for another one today. (Given the reported workups she has had, and close follow-up with OB, I think this is agreeable and clinically I find a low suspicion for pelvic infection) Musculoskeletal: No lower extremity tenderness nor edema.  No joint effusions. Neurologic:  Normal speech and language. No gross focal neurologic deficits are appreciated. No gait instability. Skin:  Skin is warm, dry and intact. No rash noted. Psychiatric: Mood  and affect are normal. Speech and behavior are normal.  ____________________________________________   LABS (all labs ordered are listed, but only abnormal results are displayed)  Labs Reviewed  URINALYSIS, COMPLETE (UACMP) WITH MICROSCOPIC - Abnormal; Notable for the following:       Result Value   Color, Urine YELLOW (*)    APPearance CLEAR (*)    Hgb urine dipstick LARGE (*)    Protein, ur 30 (*)    Bacteria, UA RARE (*)    Squamous Epithelial / LPF 0-5 (*)    All other components within normal limits  CBC - Abnormal; Notable for the following:    WBC 12.3 (*)    All other components within normal limits  BASIC METABOLIC PANEL - Abnormal; Notable for the following:    BUN 21 (*)    All other components within normal limits  URINE CULTURE  POC URINE PREG, ED  POCT PREGNANCY, URINE   ____________________________________________  EKG   ____________________________________________  RADIOLOGY  US Transvaginal Non-ob  Result Date: 09/20/2016 CLINICAL DATA:  Left-sided abdominal and flank pain 6 months worse over the past week. EXAM: TRANSABDOMINAL AND TRANSVAGINAL ULTRASOUND OF PELVIS DOPPLER ULTRASOUND OF OVARIES TECHNIQUE: Both transabdominal and transvaginal ultrasound examinations of the pelvis were performed. Transabdominal technique was performed for global imaging of the pelvis including uterus, ovaries, adnexal regions, and pelvic cul-de-sac. It was necessary to proceed with endovaginal exam following the transabdominal exam to visualize the endometrium and ovaries. Color and duplex Doppler ultrasound was utilized to evaluate blood flow to the ovaries. COMPARISON:  CT 06/01/2016 FINDINGS: Uterus Slightly retroverted. Measurements: 3.2 x 3.8 x 6.7 cm. No fibroids or other mass visualized. Endometrium Thickness: 7.3 mm.  No focal abnormality visualized. Right ovary Measurements: 2.0 x 2.1 x 2.9 cm. Normal appearance/no adnexal mass. Left ovary Measurements: 1.9 x 1.5 x 3.4  cm. Normal appearance/no adnexal mass. Pulsed Doppler evaluation of both ovaries demonstrates normal low-resistance arterial and venous waveforms. Other findings Trace free pelvic fluid. IMPRESSION: Uterus and ovaries within normal. Electronically Signed   By: Marin Olp M.D.   On: 09/20/2016 14:37   US Pelvis Complete  Result Date: 09/20/2016 CLINICAL DATA:  Left-sided abdominal and flank pain 6 months worse over the past week. EXAM: TRANSABDOMINAL AND TRANSVAGINAL ULTRASOUND OF PELVIS DOPPLER ULTRASOUND OF OVARIES TECHNIQUE: Both transabdominal and transvaginal ultrasound examinations of the pelvis were performed. Transabdominal technique was performed for global imaging of the pelvis including uterus, ovaries, adnexal regions, and pelvic cul-de-sac. It was necessary to proceed with endovaginal exam following the transabdominal exam to visualize the endometrium and ovaries. Color and duplex Doppler ultrasound was utilized to evaluate blood flow to the ovaries. COMPARISON:  CT 06/01/2016 FINDINGS: Uterus Slightly retroverted. Measurements: 3.2 x 3.8 x 6.7 cm. No fibroids or other mass visualized. Endometrium Thickness: 7.3 mm.  No focal abnormality visualized. Right ovary Measurements: 2.0 x 2.1 x 2.9 cm. Normal appearance/no adnexal mass. Left ovary Measurements: 1.9 x 1.5 x 3.4 cm. Normal appearance/no adnexal mass. Pulsed Doppler evaluation of both ovaries demonstrates normal low-resistance arterial and venous waveforms. Other findings Trace free pelvic fluid. IMPRESSION: Uterus and ovaries within normal. Electronically Signed   By: Marin Olp M.D.   On: 09/20/2016 14:37   US Renal  Result Date: 09/20/2016 CLINICAL DATA:  Left abdominal and flank pain.  Gross hematuria. EXAM: RENAL / URINARY TRACT ULTRASOUND COMPLETE COMPARISON:  06/01/2016 unenhanced CT abdomen/ pelvis. FINDINGS: Right Kidney: Length: 10.4 cm. Echogenicity within normal limits. No mass or hydronephrosis visualized. Incidental  diffuse hepatic steatosis in the visualized liver. Left Kidney: Length: 12.3 cm. New mild left hydronephrosis. Partially obstructing shadowing 7 mm stone is demonstrated in the proximal left ureter near the left ureteropelvic junction. Normal left renal parenchymal echogenicity and thickness. No left renal mass demonstrated. Bladder: Appears normal for degree of bladder distention. Bilateral ureteral jets are demonstrated in the bladder. Complete bladder emptying with voiding. IMPRESSION: 1. New mild left hydronephrosis. Partially obstructing 7 mm stone in the proximal left ureter near the left ureteropelvic junction. Bilateral ureteral jets are seen in the bladder. 2. Otherwise normal kidneys and bladder. 3. Incidental diffuse hepatic steatosis. Electronically Signed   By: Ilona Sorrel M.D.   On: 09/20/2016 14:37   [place] Art/ven Flow Abd Pelv Doppler  Result Date: 09/20/2016 CLINICAL DATA:  Left-sided abdominal and flank pain 6 months worse over the past week.  EXAM: TRANSABDOMINAL AND TRANSVAGINAL ULTRASOUND OF PELVIS DOPPLER ULTRASOUND OF OVARIES TECHNIQUE: Both transabdominal and transvaginal ultrasound examinations of the pelvis were performed. Transabdominal technique was performed for global imaging of the pelvis including uterus, ovaries, adnexal regions, and pelvic cul-de-sac. It was necessary to proceed with endovaginal exam following the transabdominal exam to visualize the endometrium and ovaries. Color and duplex Doppler ultrasound was utilized to evaluate blood flow to the ovaries. COMPARISON:  CT 06/01/2016 FINDINGS: Uterus Slightly retroverted. Measurements: 3.2 x 3.8 x 6.7 cm. No fibroids or other mass visualized. Endometrium Thickness: 7.3 mm.  No focal abnormality visualized. Right ovary Measurements: 2.0 x 2.1 x 2.9 cm. Normal appearance/no adnexal mass. Left ovary Measurements: 1.9 x 1.5 x 3.4 cm. Normal appearance/no adnexal mass. Pulsed Doppler evaluation of both ovaries demonstrates normal  low-resistance arterial and venous waveforms. Other findings Trace free pelvic fluid. IMPRESSION: Uterus and ovaries within normal. Electronically Signed   By: Marin Olp M.D.   On: 09/20/2016 14:37    ____________________________________________   PROCEDURES  Procedure(s) performed: None  Procedures  Critical Care performed: No  ____________________________________________   INITIAL IMPRESSION / ASSESSMENT AND PLAN / ED COURSE  Pertinent labs & imaging results that were available during my care of the patient were reviewed by me and considered in my medical decision making (see chart for details).  Patient presents for evaluation of persistent left flank pain for several months. Review of hospital charts here, as well as care everywhere indicates she had multiple CTs and fairly extensive workups in the emergency room. Her symptoms seem to be persistent, and have an element of chronicity in nature. It is questionable to me though if this might be neurologic as she has hematuria, denies being on her menses, and her pain is located in her mid left flank with some mild CVA tenderness. No infectious symptoms or urine shows blood. No rebound or guarding, no elements of history that suggest an acute change or peritonitis suggest an acute abdomen. I discussed repeat CT with the patient, and was shared medical decision making I think is very reasonable to forego this and said focus on ultrasound as read attempt to reduce her total radiation exposure and given this chronicity.  We'll obtain ultrasound to evaluate for possible renal etiology, hydronephrosis, as well as transvaginal to evaluate for acute etiology and exclude torsion though clinically I find it unlikely.  ----------------------------------------- 3:37 PM on 09/20/2016 -----------------------------------------  Ultrasound demonstrates left-sided nephrolithiasis. Appears explain her symptoms well, discussed with the patient and  she reports that she has prescriptions for Zofran, Flomax already at home and she'll be following up closely with urology.  The patient feels relieved to know that the kidney stone was seen today that seems explain her symptoms. She is currently following up closely, and understands return precautions and treatment recommendations.  Return precautions and treatment recommendations and follow-up discussed with the patient who is agreeable with the plan.  She is presently reporting pain is well-controlled. No ongoing nausea. awake and alert appears very stable.  Clinical Course      ____________________________________________   FINAL CLINICAL IMPRESSION(S) / ED DIAGNOSES  Final diagnoses:  Kidney stone on left side      NEW MEDICATIONS STARTED DURING THIS VISIT:  New Prescriptions   No medications on file     Note:  This document was prepared using Dragon voice recognition software and may include unintentional dictation errors.     Delman Kitten, MD 09/20/16 1537

## 2016-09-21 LAB — URINE CULTURE

## 2016-09-22 ENCOUNTER — Ambulatory Visit (INDEPENDENT_AMBULATORY_CARE_PROVIDER_SITE_OTHER): Payer: Medicaid Other | Admitting: Urology

## 2016-09-22 ENCOUNTER — Telehealth: Payer: Self-pay | Admitting: Radiology

## 2016-09-22 ENCOUNTER — Other Ambulatory Visit: Payer: Self-pay | Admitting: Radiology

## 2016-09-22 DIAGNOSIS — N200 Calculus of kidney: Secondary | ICD-10-CM | POA: Diagnosis not present

## 2016-09-22 DIAGNOSIS — R310 Gross hematuria: Secondary | ICD-10-CM

## 2016-09-22 MED ORDER — CEPHALEXIN 500 MG PO CAPS: 500.0000 mg | ORAL_CAPSULE | Freq: Three times a day (TID) | ORAL | 0 refills | Status: AC

## 2016-09-22 NOTE — Telephone Encounter (Signed)
Notified pt of surgery scheduled with Dr Tresa Moore on 10/06/16, pre-admit testing phone interview on 09/28/16 between 9am-1pm & to call Friday prior to surgery for arrival time to [facility]. Pt voices understanding.

## 2016-09-22 NOTE — Progress Notes (Signed)
09/22/2016 7:25 AM   Sula Soda 1991/04/04 [id number]  Referring provider: [address] [place], [place] [address]  CC: gross hematuria, left UPJ stone  HPI:  1. Gross hematuria - on / off gross hematuria x 1 year 2017. CT stone stude 03/2016 with likely ball-valving left UPJ stone as per below. No masses. No prior cysto or contrast imaging.  2. Recurrent Nephrolithiasis -  Pre 2017 - medical passage x1 09/2016 - 19m left UPJ by CT and then UKoreathis month, some mild left hydro present. Stone is solitary. UPreg negative. 09/2016 UCX non-clonal.  PMH sig for HTN. No blood thiners.   Today " Lissie" is seen in f/u above after ER eval which shows likely continued left renal intermitant obstrution from UPJ stone. No interval fevers.    PMH: Past Medical History:  Diagnosis Date  . Depression   . Headache   . Heartburn   . Hypertension   . Pregnancy induced hypertension   . 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  Home Medications:    Medication List       Accurate as of 09/22/16  7:25 AM. Always use your most recent med list.          acyclovir 400 MG tablet Commonly known as:  ZOVIRAX   amLODipine 5 MG tablet Commonly known as:  NORVASC Take 1 tablet (5 mg total) by mouth daily.   chlorpheniramine-HYDROcodone 10-8 MG/5ML Suer Commonly known as:  TUSSIONEX PENNKINETIC ER Take 5 mLs by mouth every 12 (twelve) hours as needed for cough.   hydrochlorothiazide 25 MG tablet Commonly known as:  HYDRODIURIL Take 1 tablet (25 mg total) by mouth daily.   predniSONE 50 MG tablet Commonly known as:  DELTASONE 1 tablet daily x 5 days.   Vitamin D (Ergocalciferol) 50000 units Caps capsule Commonly known as:  DRISDOL TAKE 1 CAPSULE BY MOUTH TWICE WEEKLY       Allergies: No Known Allergies  Family History: Family History  Problem Relation Age of Onset  . Hypertension Father   . Kidney disease Neg Hx    . Bladder Cancer Neg Hx     Social History:  reports that she has never smoked. She has never used smokeless tobacco. She reports that she does not drink alcohol or use drugs.    Review of Systems  Gastrointestinal (upper)  : Negative for upper GI symptoms  Gastrointestinal (lower) : Negative for lower GI symptoms  Constitutional : Fatigue  Skin: Negative for skin symptoms  Eyes: Negative for eye symptoms  Ear/Nose/Throat : Negative for Ear/Nose/Throat symptoms  Hematologic/Lymphatic: Negative for Hematologic/Lymphatic symptoms  Cardiovascular : Negative for cardiovascular symptoms  Respiratory : Negative for respiratory symptoms  Endocrine: Negative for endocrine symptoms  Musculoskeletal: Negative for musculoskeletal symptoms  Neurological: Negative for neurological symptoms  Psychologic: Negative for psychiatric symptoms    Physical Exam: LMP 08/22/2016   Constitutional:  Alert and oriented, No acute distress. HEENT: [place] AT, moist mucus membranes.  Trachea midline, no masses. Cardiovascular: No clubbing, cyanosis, or edema. Respiratory: Normal respiratory effort, no increased work of breathing. GI: Abdomen is soft, nontender, nondistended, no abdominal masses GU: No CVA tenderness.  Skin: No rashes, bruises or suspicious lesions. Lymph: No cervical or inguinal adenopathy. Neurologic: Grossly intact, no focal deficits, moving all 4 extremities. Psychiatric: Normal mood and affect.  Laboratory Data: Lab Results  Component Value Date   WBC 12.3 (H) 09/20/2016  HGB 14.6 09/20/2016   HCT 42.2 09/20/2016   MCV 82.5 09/20/2016   PLT 370 09/20/2016    Lab Results  Component Value Date   CREATININE 0.83 09/20/2016    No results found for: PSA  No results found for: TESTOSTERONE  No results found for: HGBA1C  Urinalysis    Component Value Date/Time   COLORURINE YELLOW (A) 09/20/2016 1136   APPEARANCEUR CLEAR (A) 09/20/2016 1136    APPEARANCEUR Cloudy (A) 06/01/2016 1557   LABSPEC 1.023 09/20/2016 1136   PHURINE 5.0 09/20/2016 1136   GLUCOSEU NEGATIVE 09/20/2016 1136   HGBUR LARGE (A) 09/20/2016 1136   BILIRUBINUR NEGATIVE 09/20/2016 1136   BILIRUBINUR NEG 09/09/2016 1427   BILIRUBINUR Negative 06/01/2016 [place] 09/20/2016 1136   PROTEINUR 30 (A) 09/20/2016 1136   UROBILINOGEN negative 09/09/2016 1427   NITRITE NEGATIVE 09/20/2016 1136   LEUKOCYTESUR NEGATIVE 09/20/2016 1136   LEUKOCYTESUR Trace (A) 06/01/2016 1557    Pertinent Imaging: as per HPI  Assessment & Plan:    1. Gross hematuria - likely from left ureteral stone, otherwise low risk. Plan as per below.  2. Nephrolithiasis - likely intermitantly obsructing left UPJ / prox ureteral stone. Rec cysto, bilat retrogrades (to complete hematuria eval) and left ureteroscopy / laser / stent next avail with goal of stone free. SWL not preferred as fertile age female. MET not preferred as has been on for >13mo without passage.   Risks, benefits, alternaties, expected peri-op course, need for possible staged approach, need for possible peri-op stents discussed.    Implications of stones in future pregnancies also discussed including rec for GU eval with imaging piror to future elective preganancies to verify stone free piror.  Keflex to start 3 days prior to uretroscopy for likely colonization / contamination based on most recent UCX.     No Follow-up on file.  MAlexis Frock [facility] [address] [place] [phone]

## 2016-09-25 ENCOUNTER — Telehealth: Payer: Self-pay | Admitting: Obstetrics and Gynecology

## 2016-09-25 NOTE — Telephone Encounter (Signed)
Pt called and she was wanting to know if there had been in scheduling with her surgery with dr Tennis Must

## 2016-09-28 ENCOUNTER — Encounter
Admission: RE | Admit: 2016-09-28 | Discharge: 2016-09-28 | Disposition: A | Discharge status: Home / Self Care | Payer: Medicaid Other | Source: Ambulatory Visit | Attending: Urology | Admitting: Urology

## 2016-09-28 DIAGNOSIS — Z01818 Encounter for other preprocedural examination: Secondary | ICD-10-CM | POA: Insufficient documentation

## 2016-09-28 HISTORY — DX: Gastro-esophageal reflux disease without esophagitis: K21.9

## 2016-09-28 HISTORY — DX: Personal history of urinary calculi: Z87.442

## 2016-09-28 HISTORY — DX: Family history of other specified conditions: Z84.89

## 2016-09-28 HISTORY — DX: Other complications of anesthesia, initial encounter: T88.59XA

## 2016-09-28 HISTORY — DX: Cardiac murmur, unspecified: R01.1

## 2016-09-28 HISTORY — DX: Adverse effect of unspecified anesthetic, initial encounter: T41.45XA

## 2016-09-28 NOTE — Patient Instructions (Signed)
  Your procedure is scheduled on: 10-06-16 Report to Same Day Surgery 2nd floor medical mall [facility] Entrance-take elevator on left to 2nd floor.  Check in with surgery information desk.) To find out your arrival time please call [phone] between 1PM - 3PM on 10-02-16  Remember: Instructions that are not followed completely may result in serious medical risk, up to and including death, or upon the discretion of your surgeon and anesthesiologist your surgery may need to be rescheduled.    _x___ 1. Do not eat food or drink liquids after midnight. No gum chewing or hard candies.     __x__ 2. No Alcohol for 24 hours before or after surgery.   __x__3. No Smoking for 24 prior to surgery.   ____  4. Bring all medications with you on the day of surgery if instructed.    __x__ 5. Notify your doctor if there is any change in your medical condition     (cold, fever, infections).     Do not wear jewelry, make-up, hairpins, clips or nail polish.  Do not wear lotions, powders, or perfumes. You may wear deodorant.  Do not shave 48 hours prior to surgery. Men may shave face and neck.  Do not bring valuables to the hospital.    [facility] is not responsible for any belongings or valuables.               Contacts, dentures or bridgework may not be worn into surgery.  Leave your suitcase in the car. After surgery it may be brought to your room.  For patients admitted to the hospital, discharge time is determined by your treatment team.   Patients discharged the day of surgery will not be allowed to drive home.  You will need someone to drive you home and stay with you the night of your procedure.    Please read over the following fact sheets that you were given:   [facility] Preparing for Surgery and or MRSA Information   _x___ Take these medicines the morning of surgery with A SIP OF WATER:    1. AMLODIPINE  2.  3.  4.  5.  6.  ____Fleets enema or Magnesium Citrate as  directed.   ____ Use CHG Soap or sage wipes as directed on instruction sheet   ____ Use inhalers on the day of surgery and bring to hospital day of surgery  ____ Stop metformin 2 days prior to surgery    ____ Take 1/2 of usual insulin dose the night before surgery and none on the morning of  surgery.   ____ Stop Aspirin, Coumadin, Pllavix ,Eliquis, Effient, or Pradaxa  x__ Stop Anti-inflammatories such as Advil, Aleve, Ibuprofen, Motrin, Naproxen,          Naprosyn, Goodies powders or aspirin products NOW-Ok to take Tylenol.   ____ Stop supplements until after surgery.    ____ Bring C-Pap to the hospital.

## 2016-09-28 NOTE — Pre-Procedure Instructions (Signed)
ECG 12 Lead10/12/2015 [facility] Health Care Component Name Value Ref Range  EKG Ventricular Rate 68 BPM   EKG Atrial Rate 68 BPM   EKG P-R Interval 142 ms  EKG QRS Duration 90 ms  EKG Q-T Interval 424 ms  EKG QTC Calculation 450 ms  EKG Calculated P Axis -5 degrees   EKG Calculated R Axis 30 degrees   EKG Calculated T Axis 26 degrees   Result Narrative  NORMAL SINUS RHYTHM NORMAL ECG NO PREVIOUS ECGS AVAILABLE Confirmed by Ishmael Holter MD, Charles ([id number]) on 07/14/2016 8:07:09 PM  Status Results Details    Appointment on 07/14/2016 Cliff")' [email or web address]/">Encounter Summary

## 2016-10-02 DIAGNOSIS — Z01818 Encounter for other preprocedural examination: Secondary | ICD-10-CM | POA: Diagnosis not present

## 2016-10-02 NOTE — Progress Notes (Signed)
Preop Antibiotic Dosing: Gentamicin Pharmacy consulted for Gentamicin for preop dosing.  TBW: 74.8Kg, IBW: 57 kg, ABW: 64.1 kg  Recommended dose is 5mg /kg 60 minutes preop. Will use ABW of 64.1 kg.  Placed order for Gentamicin 320mg  IV preop.  Paulina Fusi, PharmD, BCPS 10/02/2016 10:45 AM

## 2016-10-06 ENCOUNTER — Ambulatory Visit: Payer: Medicaid Other | Admitting: Certified Registered"

## 2016-10-06 ENCOUNTER — Encounter
Admission: RE | Disposition: A | Discharge status: Home / Self Care | Payer: Self-pay | Source: Ambulatory Visit | Attending: Urology

## 2016-10-06 ENCOUNTER — Encounter: Payer: Self-pay | Admitting: *Deleted

## 2016-10-06 ENCOUNTER — Ambulatory Visit
Admission: RE | Admit: 2016-10-06 | Discharge: 2016-10-06 | Disposition: A | Discharge status: Home / Self Care | Payer: Medicaid Other | Source: Ambulatory Visit | Attending: Urology | Admitting: Urology

## 2016-10-06 DIAGNOSIS — Z881 Allergy status to other antibiotic agents status: Secondary | ICD-10-CM | POA: Insufficient documentation

## 2016-10-06 DIAGNOSIS — Z87442 Personal history of urinary calculi: Secondary | ICD-10-CM | POA: Insufficient documentation

## 2016-10-06 DIAGNOSIS — I10 Essential (primary) hypertension: Secondary | ICD-10-CM | POA: Insufficient documentation

## 2016-10-06 DIAGNOSIS — F329 Major depressive disorder, single episode, unspecified: Secondary | ICD-10-CM | POA: Insufficient documentation

## 2016-10-06 DIAGNOSIS — R011 Cardiac murmur, unspecified: Secondary | ICD-10-CM | POA: Insufficient documentation

## 2016-10-06 DIAGNOSIS — N132 Hydronephrosis with renal and ureteral calculous obstruction: Secondary | ICD-10-CM | POA: Diagnosis not present

## 2016-10-06 DIAGNOSIS — N200 Calculus of kidney: Secondary | ICD-10-CM | POA: Diagnosis not present

## 2016-10-06 DIAGNOSIS — K219 Gastro-esophageal reflux disease without esophagitis: Secondary | ICD-10-CM | POA: Insufficient documentation

## 2016-10-06 HISTORY — PX: CYSTOSCOPY WITH STENT PLACEMENT: SHX5790

## 2016-10-06 HISTORY — PX: URETEROSCOPY WITH HOLMIUM LASER LITHOTRIPSY: SHX6645

## 2016-10-06 LAB — POCT PREGNANCY, URINE: Preg Test, Ur: NEGATIVE

## 2016-10-06 SURGERY — URETEROSCOPY, WITH LITHOTRIPSY USING HOLMIUM LASER
Anesthesia: General | Site: Ureter | Laterality: Left | Wound class: Clean Contaminated

## 2016-10-06 MED ORDER — OXYCODONE-ACETAMINOPHEN 5-325 MG PO TABS
ORAL_TABLET | ORAL | Status: AC
  Filled 2016-10-06: qty 1

## 2016-10-06 MED ORDER — FENTANYL CITRATE (PF) 100 MCG/2ML IJ SOLN
INTRAMUSCULAR | Status: AC
  Filled 2016-10-06: qty 2

## 2016-10-06 MED ORDER — LIDOCAINE HCL (CARDIAC) 20 MG/ML IV SOLN
INTRAVENOUS | Status: DC | PRN
  Administered 2016-10-06: 30 mg via INTRAVENOUS

## 2016-10-06 MED ORDER — ONDANSETRON HCL 4 MG/2ML IJ SOLN
INTRAMUSCULAR | Status: AC
  Filled 2016-10-06: qty 2

## 2016-10-06 MED ORDER — PROPOFOL 10 MG/ML IV BOLUS
INTRAVENOUS | Status: DC | PRN
  Administered 2016-10-06: 200 mg via INTRAVENOUS

## 2016-10-06 MED ORDER — FENTANYL CITRATE (PF) 100 MCG/2ML IJ SOLN
25.0000 ug | INTRAMUSCULAR | Status: AC | PRN
  Administered 2016-10-06 (×6): 25 ug via INTRAVENOUS

## 2016-10-06 MED ORDER — OXYCODONE-ACETAMINOPHEN 5-325 MG PO TABS
1.0000 | ORAL_TABLET | Freq: Four times a day (QID) | ORAL | Status: DC | PRN
Start: 2016-10-06 — End: 2016-10-06
  Administered 2016-10-06: 1 via ORAL

## 2016-10-06 MED ORDER — FENTANYL CITRATE (PF) 100 MCG/2ML IJ SOLN
INTRAMUSCULAR | Status: DC | PRN
  Administered 2016-10-06 (×4): 50 ug via INTRAVENOUS

## 2016-10-06 MED ORDER — LIDOCAINE 2% (20 MG/ML) 5 ML SYRINGE
INTRAMUSCULAR | Status: AC
  Filled 2016-10-06: qty 5

## 2016-10-06 MED ORDER — FAMOTIDINE 20 MG PO TABS
20.0000 mg | ORAL_TABLET | Freq: Once | ORAL | Status: AC
  Administered 2016-10-06: 20 mg via ORAL

## 2016-10-06 MED ORDER — DEXAMETHASONE SODIUM PHOSPHATE 10 MG/ML IJ SOLN
INTRAMUSCULAR | Status: AC
  Filled 2016-10-06: qty 1

## 2016-10-06 MED ORDER — PROPOFOL 10 MG/ML IV BOLUS
INTRAVENOUS | Status: AC
  Filled 2016-10-06: qty 20

## 2016-10-06 MED ORDER — FAMOTIDINE 20 MG PO TABS
ORAL_TABLET | ORAL | Status: AC
  Administered 2016-10-06: 20 mg via ORAL
  Filled 2016-10-06: qty 1

## 2016-10-06 MED ORDER — DEXAMETHASONE SODIUM PHOSPHATE 10 MG/ML IJ SOLN
INTRAMUSCULAR | Status: DC | PRN
  Administered 2016-10-06: 5 mg via INTRAVENOUS

## 2016-10-06 MED ORDER — GENTAMICIN SULFATE 40 MG/ML IJ SOLN
320.0000 mg | INTRAVENOUS | Status: AC
  Administered 2016-10-06: 320 mg via INTRAVENOUS
  Filled 2016-10-06: qty 8

## 2016-10-06 MED ORDER — MIDAZOLAM HCL 2 MG/2ML IJ SOLN
INTRAMUSCULAR | Status: DC | PRN
  Administered 2016-10-06: 2 mg via INTRAVENOUS

## 2016-10-06 MED ORDER — OXYCODONE-ACETAMINOPHEN 5-325 MG PO TABS: 1.0000 | ORAL_TABLET | Freq: Four times a day (QID) | ORAL | 0 refills | Status: DC | PRN

## 2016-10-06 MED ORDER — ONDANSETRON HCL 4 MG/2ML IJ SOLN
INTRAMUSCULAR | Status: DC | PRN
  Administered 2016-10-06: 4 mg via INTRAVENOUS

## 2016-10-06 MED ORDER — MIDAZOLAM HCL 2 MG/2ML IJ SOLN
INTRAMUSCULAR | Status: AC
  Filled 2016-10-06: qty 2

## 2016-10-06 MED ORDER — CEPHALEXIN 500 MG PO CAPS: 500.0000 mg | ORAL_CAPSULE | Freq: Two times a day (BID) | ORAL | 0 refills | Status: DC

## 2016-10-06 MED ORDER — FENTANYL CITRATE (PF) 100 MCG/2ML IJ SOLN
INTRAMUSCULAR | Status: AC
Start: 2016-10-06 — End: 2016-10-06
  Administered 2016-10-06: 25 ug via INTRAVENOUS
  Filled 2016-10-06: qty 2

## 2016-10-06 MED ORDER — LACTATED RINGERS IV SOLN
INTRAVENOUS | Status: DC
  Administered 2016-10-06: 11:00:00 via INTRAVENOUS

## 2016-10-06 MED ORDER — ONDANSETRON HCL 4 MG/2ML IJ SOLN
4.0000 mg | Freq: Once | INTRAMUSCULAR | Status: AC | PRN
  Administered 2016-10-06: 4 mg via INTRAVENOUS

## 2016-10-06 MED ORDER — SENNOSIDES-DOCUSATE SODIUM 8.6-50 MG PO TABS: 1.0000 | ORAL_TABLET | Freq: Two times a day (BID) | ORAL | 0 refills | Status: DC

## 2016-10-06 MED ORDER — KETOROLAC TROMETHAMINE 10 MG PO TABS: 10.0000 mg | ORAL_TABLET | Freq: Four times a day (QID) | ORAL | 1 refills | Status: DC | PRN

## 2016-10-06 SURGICAL SUPPLY — 36 items
BAG DRAIN CYSTO-URO LG1000N (MISCELLANEOUS) ×2 IMPLANT
BASKET NITINOL 4 WIRE 16 (BASKET) IMPLANT
BASKET ZERO TIP 1.9FR (BASKET) ×2 IMPLANT
CATH FOL LX CONE TIP  8F (CATHETERS)
CATH FOL LX CONE TIP 8F (CATHETERS) IMPLANT
CATH URETL 5X70 OPEN END (CATHETERS) IMPLANT
CATH URETL OPEN END 6X70 (CATHETERS) ×2 IMPLANT
CNTNR SPEC 2.5X3XGRAD LEK (MISCELLANEOUS) ×1
CONRAY 43 FOR UROLOGY 50M (MISCELLANEOUS) ×2 IMPLANT
CONT SPEC 4OZ STER OR WHT (MISCELLANEOUS) ×1
CONTAINER SPEC 2.5X3XGRAD LEK (MISCELLANEOUS) ×1 IMPLANT
FIBER LASER LITHO 273 (Laser) ×2 IMPLANT
GLOVE BIO SURGEON STRL SZ7 (GLOVE) ×2 IMPLANT
GLOVE BIO SURGEON STRL SZ7.5 (GLOVE) ×2 IMPLANT
GOWN STRL REUS W/ TWL LRG LVL3 (GOWN DISPOSABLE) ×2 IMPLANT
GOWN STRL REUS W/ TWL LRG LVL4 (GOWN DISPOSABLE) ×1 IMPLANT
GOWN STRL REUS W/ TWL XL LVL3 (GOWN DISPOSABLE) ×1 IMPLANT
GOWN STRL REUS W/TWL LRG LVL3 (GOWN DISPOSABLE) ×2
GOWN STRL REUS W/TWL LRG LVL4 (GOWN DISPOSABLE) ×1
GOWN STRL REUS W/TWL XL LVL3 (GOWN DISPOSABLE) ×1
KIT RM TURNOVER CYSTO AR (KITS) ×2 IMPLANT
PACK CYSTO AR (MISCELLANEOUS) ×2 IMPLANT
SENSORWIRE 0.038 NOT ANGLED (WIRE) ×2
SET CYSTO W/LG BORE CLAMP LF (SET/KITS/TRAYS/PACK) ×2 IMPLANT
SHEATH URETERAL 12FRX35CM (MISCELLANEOUS) ×2 IMPLANT
SOL .9 NS 3000ML IRR  AL (IV SOLUTION) ×1
SOL .9 NS 3000ML IRR UROMATIC (IV SOLUTION) ×1 IMPLANT
SOL PREP PVP 2OZ (MISCELLANEOUS) ×2
SOLUTION PREP PVP 2OZ (MISCELLANEOUS) ×1 IMPLANT
STENT URET 6FRX24 CONTOUR (STENTS) ×2 IMPLANT
SURGILUBE 2OZ TUBE FLIPTOP (MISCELLANEOUS) ×2 IMPLANT
SYRINGE 10CC LL (SYRINGE) ×2 IMPLANT
SYRINGE IRR TOOMEY STRL 70CC (SYRINGE) ×2 IMPLANT
TUBE FEED INF 8F (TUBING) ×2 IMPLANT
WATER STERILE IRR 1000ML POUR (IV SOLUTION) ×2 IMPLANT
WIRE SENSOR 0.038 NOT ANGLED (WIRE) ×1 IMPLANT

## 2016-10-06 NOTE — Anesthesia Preprocedure Evaluation (Signed)
Anesthesia Evaluation  Patient identified by MRN, date of birth, ID band Patient awake    Reviewed: Allergy & Precautions, NPO status , Patient's Chart, lab work & pertinent test results  History of Anesthesia Complications (+) Family history of anesthesia reaction and history of anesthetic complications (mother with seizure, possibly 2dary to procedure)  Airway Mallampati: II       Dental   Pulmonary neg pulmonary ROS,           Cardiovascular hypertension, Pt. on medications      Neuro/Psych Depression negative neurological ROS     GI/Hepatic Neg liver ROS, GERD (pt denies)  ,  Endo/Other  negative endocrine ROS  Renal/GU Renal disease (stones)     Musculoskeletal   Abdominal   Peds  Hematology negative hematology ROS (+)   Anesthesia Other Findings   Reproductive/Obstetrics                             Anesthesia Physical Anesthesia Plan  ASA: II  Anesthesia Plan: General   Post-op Pain Management:    Induction: Intravenous  Airway Management Planned: LMA  Additional Equipment:   Intra-op Plan:   Post-operative Plan:   Informed Consent: I have reviewed the patients History and Physical, chart, labs and discussed the procedure including the risks, benefits and alternatives for the proposed anesthesia with the patient or authorized representative who has indicated his/her understanding and acceptance.     Plan Discussed with:   Anesthesia Plan Comments:         Anesthesia Quick Evaluation

## 2016-10-06 NOTE — Anesthesia Procedure Notes (Deleted)
Anesthesia Regional Block: Narrative:       

## 2016-10-06 NOTE — OR Nursing (Signed)
1445 Ambulated to BR voided QS.  Dc'd home with procedure care complete and out of phase 2 at 1448

## 2016-10-06 NOTE — Brief Op Note (Signed)
10/06/2016  11:55 AM  PATIENT:  Sonya Bauer  25 y.o. female  PRE-OPERATIVE DIAGNOSIS:  left ureteral stone  POST-OPERATIVE DIAGNOSIS:  left ureteral stone  PROCEDURE:  Procedure(s): URETEROSCOPY WITH HOLMIUM LASER LITHOTRIPSY (Left) CYSTOSCOPY WITH STENT PLACEMENT (Left)  SURGEON:  Surgeon(s) and Role:    * Alexis Frock, MD - Primary  PHYSICIAN ASSISTANT:   ASSISTANTS: none   ANESTHESIA:   general  EBL:  No intake/output data recorded.  BLOOD ADMINISTERED:none  DRAINS: none   LOCAL MEDICATIONS USED:  NONE  SPECIMEN:  Source of Specimen:  left renal stone fragments  DISPOSITION OF SPECIMEN:  PATHOLOGY  COUNTS:  YES  TOURNIQUET:  * No tourniquets in log *  DICTATION: .Other Dictation: Dictation Number [id number]  PLAN OF CARE: Discharge to home after PACU  PATIENT DISPOSITION:  PACU - hemodynamically stable.   Delay start of Pharmacological VTE agent (>24hrs) due to surgical blood loss or risk of bleeding: not applicable

## 2016-10-06 NOTE — H&P (Signed)
Sonya Bauer is an 25 y.o. female.    Chief Complaint: Pre-op LEFT Ureteroscopic Stone Manipulation  HPI:   1. Gross hematuria - on / off gross hematuria x 1 year 2017. CT stone stude 03/2016 with likely ball-valving left UPJ stone as per below. No masses. No prior cysto or contrast imaging.  2. Recurrent Nephrolithiasis -  Pre 2017 - medical passage x1 09/2016 - 17mm left UPJ by CT and then [place] this month, some mild left hydro present. Stone is solitary. UPreg negative. 09/2016 UCX non-clonal.  PMH sig for HTN. No blood thiners.   Today " Sonya Bauer" is seen to proceed with LEFT ureteroscopic stone manipulation with goal of left side stone free and complete hematuria eval. No interval fevers. She has been on pre-op keflex for some likely non-specific coloniation by most recent UCX.    Past Medical History:  Diagnosis Date  . Complication of anesthesia   . Depression   . Family history of adverse reaction to anesthesia    MOM-HAD SEIZURE DURING SURGERY   . GERD (gastroesophageal reflux disease)    EVERY OTHER DAY  . Headache   . Heart murmur    ASYMPTOMATIC  . Heartburn   . History of kidney stones   . Hypertension   . Pregnancy induced hypertension   . 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 WISDOM TOOTH EXTRACTION     AGE 62    Family History  Problem Relation Age of Onset  . Hypertension Father   . Kidney disease Neg Hx   . Bladder Cancer Neg Hx    Social History:  reports that she has never smoked. She has never used smokeless tobacco. She reports that she does not drink alcohol or use drugs.  Allergies:  Allergies  Allergen Reactions  . Azithromycin Nausea And Vomiting    No prescriptions prior to admission.    No results found for this or any previous visit (from the past 48 hour(s)). No results found.  Review of Systems  Constitutional: Negative.  Negative for chills and fever.  HENT: Negative.   Eyes:  Negative.   Respiratory: Negative.   Cardiovascular: Negative.   Gastrointestinal: Negative.   Genitourinary: Negative.   Musculoskeletal: Negative.   Skin: Negative.   Neurological: Negative.   Endo/Heme/Allergies: Negative.   Psychiatric/Behavioral: Negative.     Last menstrual period 09/20/2016, not currently breastfeeding. Physical Exam  Constitutional: She appears well-developed.  HENT:  Head: Normocephalic.  Eyes: Pupils are equal, round, and reactive to light.  Neck: Normal range of motion.  Cardiovascular: Normal rate.   Respiratory: Effort normal.  GI: Soft.  Genitourinary:  Genitourinary Comments: Mild left CVAT  Musculoskeletal: Normal range of motion.  Neurological: She is alert.  Skin: Skin is warm.     Assessment/Plan  Proceed as planned with cysto, bilateral retrogrades, LEFT ureteroscopic stone manipulation. Risks, benefits, alternatives, expected peri-op course including need for likely temporary stent discussed.    Alexis Frock, MD 10/06/2016, 5:46 AM

## 2016-10-06 NOTE — Transfer of Care (Signed)
Immediate Anesthesia Transfer of Care Note  Patient: Sonya Bauer  Procedure(s) Performed: Procedure(s): URETEROSCOPY WITH HOLMIUM LASER LITHOTRIPSY (Left) CYSTOSCOPY WITH STENT PLACEMENT (Left)  Patient Location: PACU  Anesthesia Type:General  Level of Consciousness: sedated  Airway & Oxygen Therapy: Patient Spontanous Breathing and Patient connected to face mask oxygen  Post-op Assessment: Report given to RN and Post -op Vital signs reviewed and stable  Post vital signs: Reviewed and stable  Last Vitals:  Vitals:   10/06/16 0952 10/06/16 1201  BP: (!) 131/96 114/88  Pulse: 89 69  Resp: 20 16  Temp: 36.9 C     Last Pain:  Vitals:   10/06/16 0953  TempSrc:   PainSc: 0-No pain         Complications: No apparent anesthesia complications

## 2016-10-06 NOTE — Anesthesia Procedure Notes (Addendum)
Procedure Name: LMA Insertion Date/Time: 10/06/2016 11:19 AM Performed by: Jonna Clark Pre-anesthesia Checklist: Patient identified, Patient being monitored, Timeout performed, Emergency Drugs available and Suction available Patient Re-evaluated:Patient Re-evaluated prior to inductionOxygen Delivery Method: Circle system utilized Preoxygenation: Pre-oxygenation with 100% oxygen Intubation Type: IV induction Ventilation: Mask ventilation without difficulty LMA: LMA inserted LMA Size: 4.0 Tube type: Oral Number of attempts: 1 Placement Confirmation: positive ETCO2 and breath sounds checked- equal and bilateral Tube secured with: Tape Dental Injury: Teeth and Oropharynx as per pre-operative assessment

## 2016-10-06 NOTE — Discharge Instructions (Signed)
AMBULATORY SURGERY  DISCHARGE INSTRUCTIONS   1) The drugs that you were given will stay in your system until tomorrow so for the next 24 hours you should not:  A) Drive an automobile B) Make any legal decisions C) Drink any alcoholic beverage   2) You may resume regular meals tomorrow.  Today it is better to start with liquids and gradually work up to solid foods.  You may eat anything you prefer, but it is better to start with liquids, then soup and crackers, and gradually work up to solid foods.   3) Please notify your doctor immediately if you have any unusual bleeding, trouble breathing, redness and pain at the surgery site, drainage, fever, or pain not relieved by medication.    4) Additional Instructions:        Please contact your physician with any problems or Same Day Surgery at [phone], Monday through Friday 6 am to 4 pm, or [place] at [facility] number at [phone] - You may have urinary urgency (bladder spasms) and bloody urine on / off with stent in place. This is normal.  2 - Call MD or go to ER for fever >102, severe pain / nausea / vomiting not relieved by medications, or acute change in medical status

## 2016-10-07 NOTE — Op Note (Signed)
NAME:  Sonya Bauer, Sonya Bauer                    ACCOUNT NO.:  MEDICAL RECORD NO.:  [id number]  LOCATION:                                 FACILITY:  PHYSICIAN:  Alexis Frock, MD     DATE OF BIRTH:  15-Dec-1990  DATE OF PROCEDURE: 10/06/2016                              OPERATIVE REPORT   DIAGNOSIS:  Left renal pelvis stone with suspect intermittent obstruction.  PROCEDURE: 1. Cystoscopy with left retrograde pyelogram and interpretation. 2. Left ureteroscopy with laser lithotripsy. 3. Insertion of left ureteral stent, 6 x 24.  FINDINGS: 1. Unremarkable urinary bladder. 2. Unremarkable left retrograde pyelogram. 3. Single free-floating left renal pelvis stone, estimated 6 mm. 4. Complete resolution of stone fragments larger than 1/3rd milliliter     following laser lithotripsy and basket extraction. 5. Successful placement of left ureteral stent, proximal in renal     pelvis and distal in urinary bladder. 6. Submucosal edema in the area of UPJ, likely consistent with     intermittent obstruction.  INDICATION:  Ms. Sonya Bauer is a pleasant 25 year old young woman with history of intermittent left flank pain and some hematuria.  She was found upon evaluation of this to have a left lower pole renal stone.  Followup ultrasound has revealed intermittent hydronephrosis on occasion, likely consistent with intermittent obstruction from this.  Options were discussed for management including surveillance versus shockwave lithotripsy versus ureteroscopy, and she adamantly wished to proceed with the latter with goal of ipsilateral stone free.  Informed consent was obtained and placed in the medical record.  DESCRIPTION OF PROCEDURE:  The patient being Sonya Bauer for achieving a left ureteroscopic stone manipulation was confirmed.  Procedure was carried out.  Time-out was performed.  Intravenous antibiotics were administered.  General LMA anesthesia was induced.  The patient was placed into a low  lithotomy position.  Sterile field was created by prepping and draping the vagina, introitus, and proximal thighs using iodine.  Next, cystourethroscopy was performed using a rigid cystoscope with offset lens.  The left ureteral orifice was cannulated with a 6- French end-hole catheter and left retrograde pyelogram was obtained.  Left retrograde pyelogram demonstrated a single left ureter with single- system left kidney.  No obvious filling defects or narrowing noted.  A 0.038 Zip wire was advanced at the level of the upper pole, set aside as a safety wire.  An 8-French feeding tube placed in the urinary bladder for pressure release.  Next, semi-rigid ureteroscopy was performed of the distal four-fifths of the left ureter alongside a separate Sensor working wire.  No mucosal abnormalities were found.  Next, a 12/14 ureteral access sheath was carefully advanced at the level of proximal ureter using continuous fluoroscopic guidance and flexible digital ureteroscopy performed of the proximal left ureter and systematic inspection of the left kidney including all calices x3 with a single channel ureteroscope.  There was some moderate mucosal edema and inflammation in the area of UPJ, likely consistent with intermittent obstruction.  There was a single free-floating intrarenal stone as anticipated, estimated 6 mm.  So, it was felt that this was too big for simple basket extraction.  As such, it was  grasped with the basket and repositioned into the upper pole to allow for more favorable angulation and holmium laser energy applied to the stone using settings of 0.2 joules and 20 Hz with 273 nanometer fiber and the stone was fragmented into approximately 3 smaller pieces.  These were then sequentially grasped in their long axis, removed, and set aside for compositional analysis.  Repeat inspection of the left kidney including all calices revealed complete resolution of all stone fragments larger  than 1/3rd millimeter.  The access sheath was removed under continuous vision, and again, the area of mucosal edema was noted at the area of the UPJ, otherwise unremarkable.  It was felt that interval stenting would be warranted without tether.  As such, a new 6 x 24 Contour-type stent was placed with remaining safety wire using fluoroscopic guidance.  Good proximal and distal deployment was noted and the procedure was terminated.  The patient tolerated the procedure well.  No immediate periprocedural complications.  The patient was taken to the postanesthesia care unit in stable condition.          ______________________________ Alexis Frock, MD     TM/MEDQ  D:  10/06/2016  T:  10/07/2016  Job:  [id number]

## 2016-10-09 ENCOUNTER — Emergency Department
Admission: EM | Admit: 2016-10-09 | Discharge: 2016-10-09 | Disposition: A | Discharge status: Home / Self Care | Payer: Medicaid Other | Source: Home / Self Care | Attending: Emergency Medicine | Admitting: Emergency Medicine

## 2016-10-09 ENCOUNTER — Telehealth: Payer: Self-pay | Admitting: Radiology

## 2016-10-09 ENCOUNTER — Emergency Department: Payer: Medicaid Other

## 2016-10-09 DIAGNOSIS — N3000 Acute cystitis without hematuria: Secondary | ICD-10-CM | POA: Insufficient documentation

## 2016-10-09 DIAGNOSIS — Z79899 Other long term (current) drug therapy: Secondary | ICD-10-CM

## 2016-10-09 DIAGNOSIS — I10 Essential (primary) hypertension: Secondary | ICD-10-CM

## 2016-10-09 DIAGNOSIS — R109 Unspecified abdominal pain: Secondary | ICD-10-CM

## 2016-10-09 DIAGNOSIS — K5900 Constipation, unspecified: Secondary | ICD-10-CM | POA: Diagnosis not present

## 2016-10-09 DIAGNOSIS — N390 Urinary tract infection, site not specified: Secondary | ICD-10-CM | POA: Diagnosis not present

## 2016-10-09 DIAGNOSIS — Z87442 Personal history of urinary calculi: Secondary | ICD-10-CM | POA: Diagnosis not present

## 2016-10-09 DIAGNOSIS — N23 Unspecified renal colic: Principal | ICD-10-CM | POA: Insufficient documentation

## 2016-10-09 DIAGNOSIS — A6000 Herpesviral infection of urogenital system, unspecified: Secondary | ICD-10-CM | POA: Insufficient documentation

## 2016-10-09 DIAGNOSIS — Z960 Presence of urogenital implants: Secondary | ICD-10-CM | POA: Insufficient documentation

## 2016-10-09 LAB — COMPREHENSIVE METABOLIC PANEL
ALT: 42 U/L (ref 14–54)
AST: 32 U/L (ref 15–41)
Albumin: 4.6 g/dL (ref 3.5–5.0)
Alkaline Phosphatase: 80 U/L (ref 38–126)
Anion gap: 7 (ref 5–15)
BILIRUBIN TOTAL: 0.6 mg/dL (ref 0.3–1.2)
BUN: 16 mg/dL (ref 6–20)
CALCIUM: 9.2 mg/dL (ref 8.9–10.3)
CO2: 26 mmol/L (ref 22–32)
CREATININE: 0.71 mg/dL (ref 0.44–1.00)
Chloride: 107 mmol/L (ref 101–111)
GFR calc Af Amer: >60 mL/min (ref >60)
Glucose, Bld: 114 mg/dL — ABNORMAL HIGH (ref 65–99)
Potassium: 3.8 mmol/L (ref 3.5–5.1)
Sodium: 140 mmol/L (ref 135–145)
TOTAL PROTEIN: 8 g/dL (ref 6.5–8.1)

## 2016-10-09 LAB — URINALYSIS, COMPLETE (UACMP) WITH MICROSCOPIC
Bilirubin Urine: NEGATIVE
GLUCOSE, UA: NEGATIVE mg/dL
Ketones, ur: NEGATIVE mg/dL
Nitrite: NEGATIVE
PROTEIN: 100 mg/dL — AB
SPECIFIC GRAVITY, URINE: 1.012 (ref 1.005–1.030)
pH: 6 (ref 5.0–8.0)

## 2016-10-09 LAB — CBC WITH DIFFERENTIAL/PLATELET
BASOS ABS: 0 10*3/uL (ref 0–0.1)
Basophils Relative: 0 %
Eosinophils Absolute: 0.3 10*3/uL (ref 0–0.7)
Eosinophils Relative: 3 %
HEMATOCRIT: 39.1 % (ref 35.0–47.0)
Hemoglobin: 13.9 g/dL (ref 12.0–16.0)
LYMPHS ABS: 1.4 10*3/uL (ref 1.0–3.6)
LYMPHS PCT: 14 %
MCH: 29 pg (ref 26.0–34.0)
MCHC: 35.5 g/dL (ref 32.0–36.0)
MCV: 81.6 fL (ref 80.0–100.0)
Monocytes Absolute: 0.7 10*3/uL (ref 0.2–0.9)
Monocytes Relative: 7 %
NEUTROS ABS: 7.6 10*3/uL — AB (ref 1.4–6.5)
Neutrophils Relative %: 76 %
Platelets: 317 10*3/uL (ref 150–440)
RBC: 4.79 MIL/uL (ref 3.80–5.20)
RDW: 13.5 % (ref 11.5–14.5)
WBC: 10.1 10*3/uL (ref 3.6–11.0)

## 2016-10-09 LAB — POCT PREGNANCY, URINE: Preg Test, Ur: NEGATIVE

## 2016-10-09 MED ORDER — CIPROFLOXACIN HCL 500 MG PO TABS: 500.0000 mg | ORAL_TABLET | Freq: Once | ORAL | Status: DC

## 2016-10-09 MED ORDER — ONDANSETRON HCL 4 MG/2ML IJ SOLN
4.0000 mg | Freq: Once | INTRAMUSCULAR | Status: AC
  Administered 2016-10-09: 4 mg via INTRAVENOUS
  Filled 2016-10-09: qty 2

## 2016-10-09 MED ORDER — CIPROFLOXACIN HCL 500 MG PO TABS: 500.0000 mg | ORAL_TABLET | Freq: Two times a day (BID) | ORAL | 0 refills | Status: DC

## 2016-10-09 MED ORDER — SODIUM CHLORIDE 0.9 % IV BOLUS (SEPSIS)
1000.0000 mL | Freq: Once | INTRAVENOUS | Status: AC
  Administered 2016-10-09: 1000 mL via INTRAVENOUS

## 2016-10-09 MED ORDER — MORPHINE SULFATE (PF) 4 MG/ML IV SOLN
4.0000 mg | Freq: Once | INTRAVENOUS | Status: AC
  Administered 2016-10-09: 4 mg via INTRAVENOUS
  Filled 2016-10-09: qty 1

## 2016-10-09 NOTE — ED Triage Notes (Signed)
Pt here for left flank pain since Tuesday when she had stent placed due to kidney stones and was here this am for pain control. States was told she had UTI and told to follow up with urologist. Pt here for persistent pain states stent supposed to have stent removed Thursday but states cannot tolerate pain.

## 2016-10-09 NOTE — ED Notes (Signed)
No protocols per Dr. Kerman Passey

## 2016-10-09 NOTE — ED Provider Notes (Signed)
[facility] Emergency Department Provider Note   First MD Initiated Contact with Patient 10/09/16 [phone]     (approximate)  I have reviewed the triage vital signs and the nursing notes.   HISTORY  Chief Complaint Flank Pain   HPI Sonya Bauer is a 25 y.o. female with recent left urethral stent placement on December 26 secondary to 7 mm ureterolithiasis presents to the emergency department with 10 out of 10 left flank pain is unrelieved with Percocet which she was prescribed for home. Patient denies any fever afebrile on presentation. Patient denies any dysuria but does admit to hematuria.   Past Medical History:  Diagnosis Date  . Complication of anesthesia   . Depression   . Family history of adverse reaction to anesthesia    MOM-HAD SEIZURE DURING SURGERY   . GERD (gastroesophageal reflux disease)    EVERY OTHER DAY  . Headache   . Heart murmur    ASYMPTOMATIC  . Heartburn   . History of kidney stones   . Hypertension   . Pregnancy induced hypertension   . 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 Gross hematuria 09/22/2016  . Nephrolithiasis 09/22/2016  . Family history of endometriosis in first degree relative 09/09/2016  . Hypertension 09/09/2016  . Female dyspareunia 09/09/2016  . Menorrhagia with regular cycle 09/09/2016  . Vitamin D deficiency 07/31/2016  . Vaginal delivery 07/03/2015  . 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 Elevated blood pressure reading 05/08/2015    Past Surgical History:  Procedure Laterality Date  . CYSTOSCOPY WITH STENT PLACEMENT Left 10/06/2016   Procedure: CYSTOSCOPY WITH STENT PLACEMENT;  Surgeon: Alexis Frock, MD;  Location: [facility] ORS;  Service: Urology;  Laterality: Left;  . URETEROSCOPY WITH HOLMIUM LASER LITHOTRIPSY Left 10/06/2016   Procedure: URETEROSCOPY WITH HOLMIUM LASER LITHOTRIPSY;  Surgeon: Alexis Frock, MD;  Location: [facility] ORS;  Service: Urology;  Laterality:  Left;  . WISDOM TOOTH EXTRACTION     AGE 40    Prior to Admission medications   Medication Sig Start Date End Date Taking? Authorizing Provider  acyclovir (ZOVIRAX) 200 MG capsule Take 200 mg by mouth as needed.    Historical Provider, MD  amLODipine (NORVASC) 5 MG tablet Take 1 tablet (5 mg total) by mouth daily. Patient taking differently: Take 5 mg by mouth every morning.  07/30/16   Melody N Shambley, CNM  cephALEXin (KEFLEX) 500 MG capsule Take 1 capsule (500 mg total) by mouth 2 (two) times daily. X 3 days. Begin day prior to next Urology appointment. 10/06/16   Alexis Frock, MD  cetirizine (ZYRTEC) 10 MG tablet Take 10 mg by mouth at bedtime.    Historical Provider, MD  hydrochlorothiazide (HYDRODIURIL) 25 MG tablet Take 1 tablet (25 mg total) by mouth daily. 07/30/16   Melody N Shambley, CNM  ketorolac (TORADOL) 10 MG tablet Take 1 tablet (10 mg total) by mouth every 6 (six) hours as needed for moderate pain. Or sent discomfort post-operatively. 10/06/16   Alexis Frock, MD  oxyCODONE-acetaminophen (ROXICET) 5-325 MG tablet Take 1-2 tablets by mouth every 6 (six) hours as needed for severe pain. Post-operatively 10/06/16   Alexis Frock, MD  Saccharomyces boulardii (PROBIOTIC) 250 MG CAPS Take by mouth.    Historical Provider, MD  senna-docusate (SENOKOT-S) 8.6-50 MG tablet Take 1 tablet by mouth 2 (two) times daily. While taking strongest pain meds to prevent constipation. 10/06/16   Alexis Frock, MD  Allergies Azithromycin  Family History  Problem Relation Age of Onset  . Hypertension Father   . Kidney disease Neg Hx   . Bladder Cancer Neg Hx     Social History Social History  Substance Use Topics  . Smoking status: Never Smoker  . Smokeless tobacco: Never Used  . Alcohol use No    Review of Systems Constitutional: No fever/chills Eyes: No visual changes. ENT: No sore throat. Cardiovascular: Denies chest pain. Respiratory: Denies shortness of  breath. Gastrointestinal: No abdominal pain.  No nausea, no vomiting.  No diarrhea.  No constipation.Positive for left flank pain Genitourinary: Negative for dysuria. Positive for hematuria Musculoskeletal: Negative for back pain.  Skin: Negative for rash. Neurological: Negative for headaches, focal weakness or numbness.  10-point ROS otherwise negative.  ____________________________________________   PHYSICAL EXAM:  VITAL SIGNS: ED Triage Vitals [10/09/16 0421]  Enc Vitals Group     BP (!) 134/104     Pulse Rate 93     Resp 20     Temp 97.9 F (36.6 C)     Temp Source Oral     SpO2 97 %     Weight 165 lb (74.8 kg)     Height 5\' 5"  (1.651 m)     Head Circumference      Peak Flow      Pain Score 9     Pain Loc      Pain Edu?      Excl. in [place]?     Constitutional: Alert and oriented. Apparent discomfort distress. Eyes: Conjunctivae are normal. PERRL. EOMI. Head: Atraumatic. Mouth/Throat: Mucous membranes are moist.  Oropharynx non-erythematous. Cardiovascular: Normal rate, regular rhythm. Good peripheral circulation. Grossly normal heart sounds. Respiratory: Normal respiratory effort.  No retractions. Lungs CTAB. Gastrointestinal: Soft and nontender. No distention.  Musculoskeletal: No lower extremity tenderness nor edema. No gross deformities of extremities. Neurologic:  Normal speech and language. No gross focal neurologic deficits are appreciated.  Skin:  Skin is warm, dry and intact. No rash noted. Psychiatric: Mood and affect are normal. Speech and behavior are normal.  ____________________________________________   LABS (all labs ordered are listed, but only abnormal results are displayed)  Labs Reviewed  URINALYSIS, COMPLETE (UACMP) WITH MICROSCOPIC - Abnormal; Notable for the following:       Result Value   Color, Urine RED (*)    APPearance CLOUDY (*)    Hgb urine dipstick LARGE (*)    Protein, ur 100 (*)    Leukocytes, UA MODERATE (*)    Bacteria, UA  FEW (*)    Squamous Epithelial / LPF 6-30 (*)    All other components within normal limits  CBC WITH DIFFERENTIAL/PLATELET - Abnormal; Notable for the following:    Neutro Abs 7.6 (*)    All other components within normal limits  COMPREHENSIVE METABOLIC PANEL - Abnormal; Notable for the following:    Glucose, Bld 114 (*)    All other components within normal limits  POC URINE PREG, ED  POCT PREGNANCY, URINE    RADIOLOGY I, [address] N Johndavid Geralds, personally viewed and evaluated these images (plain radiographs) as part of my medical decision making, as well as reviewing the written report by the radiologist.  Dg Abdomen 1 View  Result Date: 10/09/2016 CLINICAL DATA:  Left flank pain. Ureteral stent placed 3 days prior. EXAM: ABDOMEN - 1 VIEW COMPARISON:  CT 06/01/2016 FINDINGS: Left ureteral stent in place, expected pigtail location over the bladder and left renal pelvis. No stones along  the course of the stent. No stones project over either renal shadow or in the pelvis. Moderate stool in the right colon. Nonobstructive bowel gas pattern. No osseous abnormality. IMPRESSION: Left ureteral stent in place.  No evidence of urolithiasis. Electronically Signed   By: Jeb Levering M.D.   On: 10/09/2016 05:18     Procedures  __   INITIAL IMPRESSION / ASSESSMENT AND PLAN / ED COURSE  Pertinent labs & imaging results that were available during my care of the patient were reviewed by me and considered in my medical decision making (see chart for details).  History of physical exam consistent with possible urethral spasm. Patient given IV morphine on presentation emergency department with resolution of pain. Of note patient noted to have a urinary tract infection and a such will be prescribed ciprofloxacin for home. Patient is advised to follow-up with Dr. Tresa Moore   Clinical Course     ____________________________________________  FINAL CLINICAL IMPRESSION(S) / ED DIAGNOSES  Final  diagnoses:  Acute cystitis without hematuria  Flank pain     MEDICATIONS GIVEN DURING THIS VISIT:  Medications  ciprofloxacin (CIPRO) tablet 500 mg (not administered)  morphine 4 MG/ML injection 4 mg (4 mg Intravenous Given 10/09/16 0452)  ondansetron (ZOFRAN) injection 4 mg (4 mg Intravenous Given 10/09/16 0450)  sodium chloride 0.9 % bolus 1,000 mL (1,000 mLs Intravenous New Bag/Given 10/09/16 0450)     NEW OUTPATIENT MEDICATIONS STARTED DURING THIS VISIT:  New Prescriptions   No medications on file    Modified Medications   No medications on file    Discontinued Medications   No medications on file     Note:  This document was prepared using Dragon voice recognition software and may include unintentional dictation errors.    Gregor Hams, MD 10/09/16 [phone]

## 2016-10-09 NOTE — ED Notes (Signed)
Dr. Brown at the bedside for pt evaluation 

## 2016-10-09 NOTE — Telephone Encounter (Signed)
Pt had URS on 10/06/16 with Dr Tresa Moore. Pt states she went to the ER this morning d/t "excruciating pain" & was told she has a UTI & was given abx. But ucx was not collected. She has appt on 10/15/16 for cysto stent removal. Pt asks if she needs to keep this appt as scheduled or change appt? Please advise.

## 2016-10-09 NOTE — Telephone Encounter (Signed)
Per Dr Pilar Jarvis, pt was advised to keep appt on 10/15/16 as scheduled & left Myrbetriq 50mg  at front desk to be taken 1 tablet daily x 7 days for stent pain. Pt voices understanding.

## 2016-10-09 NOTE — ED Triage Notes (Signed)
Pt in with co left flank pain since having stent placed Tuesday due to kidney stones. States pain is progressively getting worse, taking percocet without relief.

## 2016-10-10 ENCOUNTER — Emergency Department: Payer: Medicaid Other

## 2016-10-10 ENCOUNTER — Observation Stay
Admission: EM | Admit: 2016-10-10 | Discharge: 2016-10-13 | Disposition: A | Discharge status: Home / Self Care | Payer: Medicaid Other | Attending: Urology | Admitting: Urology

## 2016-10-10 ENCOUNTER — Encounter: Payer: Self-pay | Admitting: *Deleted

## 2016-10-10 DIAGNOSIS — R1032 Left lower quadrant pain: Secondary | ICD-10-CM | POA: Diagnosis not present

## 2016-10-10 DIAGNOSIS — T839XXA Unspecified complication of genitourinary prosthetic device, implant and graft, initial encounter: Secondary | ICD-10-CM | POA: Diagnosis present

## 2016-10-10 DIAGNOSIS — R109 Unspecified abdominal pain: Secondary | ICD-10-CM

## 2016-10-10 DIAGNOSIS — Z87442 Personal history of urinary calculi: Secondary | ICD-10-CM | POA: Diagnosis not present

## 2016-10-10 LAB — URINALYSIS, COMPLETE (UACMP) WITH MICROSCOPIC
Bilirubin Urine: NEGATIVE
GLUCOSE, UA: NEGATIVE mg/dL
Ketones, ur: 20 mg/dL — AB
NITRITE: NEGATIVE
PH: 6 (ref 5.0–8.0)
Protein, ur: 100 mg/dL — AB
SPECIFIC GRAVITY, URINE: 1.019 (ref 1.005–1.030)

## 2016-10-10 LAB — BASIC METABOLIC PANEL
ANION GAP: 9 (ref 5–15)
BUN: 15 mg/dL (ref 6–20)
CALCIUM: 9.3 mg/dL (ref 8.9–10.3)
CO2: 24 mmol/L (ref 22–32)
Chloride: 103 mmol/L (ref 101–111)
Creatinine, Ser: 0.6 mg/dL (ref 0.44–1.00)
GFR calc non Af Amer: >60 mL/min (ref >60)
GLUCOSE: 108 mg/dL — AB (ref 65–99)
POTASSIUM: 3.8 mmol/L (ref 3.5–5.1)
Sodium: 136 mmol/L (ref 135–145)

## 2016-10-10 LAB — CBC WITH DIFFERENTIAL/PLATELET
BASOS ABS: 0 10*3/uL (ref 0–0.1)
BASOS PCT: 0 %
Eosinophils Absolute: 0.2 10*3/uL (ref 0–0.7)
Eosinophils Relative: 2 %
HEMATOCRIT: 37.1 % (ref 35.0–47.0)
HEMOGLOBIN: 13.3 g/dL (ref 12.0–16.0)
LYMPHS PCT: 18 %
Lymphs Abs: 1.7 10*3/uL (ref 1.0–3.6)
MCH: 29.4 pg (ref 26.0–34.0)
MCHC: 35.7 g/dL (ref 32.0–36.0)
MCV: 82.1 fL (ref 80.0–100.0)
MONO ABS: 0.6 10*3/uL (ref 0.2–0.9)
MONOS PCT: 6 %
NEUTROS ABS: 7.2 10*3/uL — AB (ref 1.4–6.5)
NEUTROS PCT: 74 %
Platelets: 330 10*3/uL (ref 150–440)
RBC: 4.52 MIL/uL (ref 3.80–5.20)
RDW: 13.5 % (ref 11.5–14.5)
WBC: 9.7 10*3/uL (ref 3.6–11.0)

## 2016-10-10 MED ORDER — HYDROMORPHONE HCL 1 MG/ML IJ SOLN
0.5000 mg | INTRAMUSCULAR | Status: DC | PRN
  Administered 2016-10-10 – 2016-10-12 (×6): 1 mg via INTRAVENOUS
  Filled 2016-10-10 (×6): qty 1

## 2016-10-10 MED ORDER — KETOROLAC TROMETHAMINE 30 MG/ML IJ SOLN
30.0000 mg | Freq: Once | INTRAMUSCULAR | Status: AC
  Administered 2016-10-10: 30 mg via INTRAVENOUS
  Filled 2016-10-10: qty 1

## 2016-10-10 MED ORDER — DIPHENHYDRAMINE HCL 12.5 MG/5ML PO ELIX
12.5000 mg | ORAL_SOLUTION | Freq: Four times a day (QID) | ORAL | Status: DC | PRN
Start: 2016-10-10 — End: 2016-10-13

## 2016-10-10 MED ORDER — MORPHINE SULFATE (PF) 4 MG/ML IV SOLN
4.0000 mg | Freq: Once | INTRAVENOUS | Status: AC
  Administered 2016-10-10: 4 mg via INTRAVENOUS
  Filled 2016-10-10: qty 1

## 2016-10-10 MED ORDER — OXYCODONE HCL 5 MG PO TABS
15.0000 mg | ORAL_TABLET | ORAL | Status: DC | PRN
  Administered 2016-10-10 – 2016-10-11 (×3): 15 mg via ORAL
  Filled 2016-10-10 (×4): qty 3

## 2016-10-10 MED ORDER — HYDROMORPHONE HCL 1 MG/ML IJ SOLN
1.0000 mg | Freq: Once | INTRAMUSCULAR | Status: AC
  Administered 2016-10-10: 1 mg via INTRAVENOUS

## 2016-10-10 MED ORDER — SODIUM CHLORIDE 0.9 % IV BOLUS (SEPSIS)
1000.0000 mL | Freq: Once | INTRAVENOUS | Status: AC
  Administered 2016-10-10: 1000 mL via INTRAVENOUS

## 2016-10-10 MED ORDER — ONDANSETRON HCL 4 MG/2ML IJ SOLN
4.0000 mg | INTRAMUSCULAR | Status: DC | PRN
  Administered 2016-10-11 – 2016-10-13 (×3): 4 mg via INTRAVENOUS
  Filled 2016-10-10 (×5): qty 2

## 2016-10-10 MED ORDER — ACETAMINOPHEN 500 MG PO TABS
1000.0000 mg | ORAL_TABLET | Freq: Once | ORAL | Status: AC
  Administered 2016-10-10: 1000 mg via ORAL
  Filled 2016-10-10: qty 2

## 2016-10-10 MED ORDER — OXYCODONE HCL 5 MG PO TABS: 5.0000 mg | ORAL_TABLET | Freq: Three times a day (TID) | ORAL | 0 refills | Status: DC | PRN

## 2016-10-10 MED ORDER — TAMSULOSIN HCL 0.4 MG PO CAPS: 0.4000 mg | ORAL_CAPSULE | Freq: Every day | ORAL | 0 refills | Status: DC

## 2016-10-10 MED ORDER — SODIUM CHLORIDE 0.9 % IV SOLN
INTRAVENOUS | Status: DC
  Administered 2016-10-10 – 2016-10-12 (×5): via INTRAVENOUS

## 2016-10-10 MED ORDER — DIAZEPAM 5 MG PO TABS
5.0000 mg | ORAL_TABLET | Freq: Four times a day (QID) | ORAL | Status: DC | PRN
  Administered 2016-10-11 (×2): 5 mg via ORAL
  Filled 2016-10-10 (×3): qty 1

## 2016-10-10 MED ORDER — ONDANSETRON HCL 4 MG/2ML IJ SOLN
4.0000 mg | Freq: Once | INTRAMUSCULAR | Status: AC
  Administered 2016-10-10: 4 mg via INTRAVENOUS
  Filled 2016-10-10: qty 2

## 2016-10-10 MED ORDER — HYDROMORPHONE HCL 1 MG/ML IJ SOLN
INTRAMUSCULAR | Status: AC
  Administered 2016-10-10: 1 mg via INTRAVENOUS
  Filled 2016-10-10: qty 1

## 2016-10-10 MED ORDER — INFLUENZA VAC SPLIT QUAD 0.5 ML IM SUSY: 0.5000 mL | PREFILLED_SYRINGE | INTRAMUSCULAR | Status: DC

## 2016-10-10 MED ORDER — OXYBUTYNIN CHLORIDE ER 10 MG PO TB24
10.0000 mg | ORAL_TABLET | Freq: Every day | ORAL | Status: DC
Start: 2016-10-10 — End: 2016-10-13
  Administered 2016-10-10 – 2016-10-13 (×4): 10 mg via ORAL
  Filled 2016-10-10 (×4): qty 1

## 2016-10-10 MED ORDER — TAMSULOSIN HCL 0.4 MG PO CAPS
0.4000 mg | ORAL_CAPSULE | Freq: Once | ORAL | Status: AC
  Administered 2016-10-10: 0.4 mg via ORAL
  Filled 2016-10-10: qty 1

## 2016-10-10 MED ORDER — KETOPROFEN 50 MG PO CAPS: 50.0000 mg | ORAL_CAPSULE | Freq: Four times a day (QID) | ORAL | 0 refills | Status: DC

## 2016-10-10 MED ORDER — DIPHENHYDRAMINE HCL 50 MG/ML IJ SOLN: 12.5000 mg | Freq: Four times a day (QID) | INTRAMUSCULAR | Status: DC | PRN

## 2016-10-10 MED ORDER — HYOSCYAMINE SULFATE 0.125 MG SL SUBL
0.1250 mg | SUBLINGUAL_TABLET | SUBLINGUAL | Status: DC | PRN
  Administered 2016-10-10 – 2016-10-11 (×2): 0.125 mg via SUBLINGUAL
  Filled 2016-10-10 (×4): qty 1

## 2016-10-10 MED ORDER — HYOSCYAMINE SULFATE 0.125 MG SL SUBL
0.1250 mg | SUBLINGUAL_TABLET | SUBLINGUAL | Status: DC | PRN
  Filled 2016-10-10: qty 1

## 2016-10-10 MED ORDER — AMLODIPINE BESYLATE 5 MG PO TABS
5.0000 mg | ORAL_TABLET | Freq: Every day | ORAL | Status: DC
  Administered 2016-10-10 – 2016-10-13 (×4): 5 mg via ORAL
  Filled 2016-10-10 (×4): qty 1

## 2016-10-10 MED ORDER — DIAZEPAM 5 MG PO TABS
5.0000 mg | ORAL_TABLET | Freq: Once | ORAL | Status: AC
  Administered 2016-10-10: 5 mg via ORAL
  Filled 2016-10-10: qty 1

## 2016-10-10 MED ORDER — OXYCODONE HCL 5 MG PO TABS
5.0000 mg | ORAL_TABLET | Freq: Once | ORAL | Status: AC
  Administered 2016-10-10: 5 mg via ORAL
  Filled 2016-10-10: qty 1

## 2016-10-10 MED ORDER — ZOLPIDEM TARTRATE 5 MG PO TABS: 5.0000 mg | ORAL_TABLET | Freq: Every evening | ORAL | Status: DC | PRN

## 2016-10-10 NOTE — H&P (Signed)
Urology Admission H&P  Chief Complaint: left flank pain  History of Present Illness: Ms Sonya Bauer is a 25yo with hx of nephrolithiasis who s/p left ureteroscopic stone extraction 9 days ago who has presented twice with uncontrolled left flank pain. The has sharp, intermittent, nonradiating severe left flank pain with associated nausea. The pain is worse with urination. No hematuria. No fevers. KUB from yesterday shows left ureteral stent in good position.   Past Medical History:  Diagnosis Date  . Complication of anesthesia   . Depression   . Family history of adverse reaction to anesthesia    MOM-HAD SEIZURE DURING SURGERY   . GERD (gastroesophageal reflux disease)    EVERY OTHER DAY  . Headache   . Heart murmur    ASYMPTOMATIC  . Heartburn   . History of kidney stones   . Hypertension   . Pregnancy induced hypertension   . 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 CYSTOSCOPY WITH STENT PLACEMENT Left 10/06/2016   Procedure: CYSTOSCOPY WITH STENT PLACEMENT;  Surgeon: Alexis Frock, MD;  Location: [facility] ORS;  Service: Urology;  Laterality: Left;  . URETEROSCOPY WITH HOLMIUM LASER LITHOTRIPSY Left 10/06/2016   Procedure: URETEROSCOPY WITH HOLMIUM LASER LITHOTRIPSY;  Surgeon: Alexis Frock, MD;  Location: [facility] ORS;  Service: Urology;  Laterality: Left;  . WISDOM TOOTH EXTRACTION     AGE 14    Home Medications:  Prescriptions Prior to Admission  Medication Sig Dispense Refill Last Dose  . acyclovir (ZOVIRAX) 200 MG capsule Take 200 mg by mouth as needed.   Past Week at Unknown time  . amLODipine (NORVASC) 5 MG tablet Take 1 tablet (5 mg total) by mouth daily. 30 tablet 6 Past Week at Unknown time  . cephALEXin (KEFLEX) 500 MG capsule Take 1 capsule (500 mg total) by mouth 2 (two) times daily. X 3 days. Begin day prior to next Urology appointment. 6 capsule 0 Unknown at Unknown  . cetirizine (ZYRTEC) 10 MG tablet Take 10 mg by mouth at bedtime.   Past  Week at Unknown time  . ciprofloxacin (CIPRO) 500 MG tablet Take 1 tablet (500 mg total) by mouth 2 (two) times daily. 14 tablet 0 10/09/2016 at 1800  . hydrochlorothiazide (HYDRODIURIL) 25 MG tablet Take 1 tablet (25 mg total) by mouth daily. 30 tablet 3 Past Month at Unknown time  . ketorolac (TORADOL) 10 MG tablet Take 1 tablet (10 mg total) by mouth every 6 (six) hours as needed for moderate pain. Or sent discomfort post-operatively. 20 tablet 1 prn at prn  . oxyCODONE-acetaminophen (ROXICET) 5-325 MG tablet Take 1-2 tablets by mouth every 6 (six) hours as needed for severe pain. Post-operatively 15 tablet 0 10/10/2016 at 0100  . Saccharomyces boulardii (PROBIOTIC) 250 MG CAPS Take by mouth.   Past Week at Unknown time  . senna-docusate (SENOKOT-S) 8.6-50 MG tablet Take 1 tablet by mouth 2 (two) times daily. While taking strongest pain meds to prevent constipation. 20 tablet 0 prn at prn   Allergies:  Allergies  Allergen Reactions  . Azithromycin Nausea And Vomiting    Family History  Problem Relation Age of Onset  . Hypertension Father   . Kidney disease Neg Hx   . Bladder Cancer Neg Hx    Social History:  reports that she has never smoked. She has never used smokeless tobacco. She reports that she does not drink alcohol or use drugs.  Review of Systems  Gastrointestinal: Positive for abdominal pain  and nausea.  Genitourinary: Positive for dysuria, flank pain, frequency and urgency.  All other systems reviewed and are negative.   Physical Exam:  Vital signs in last 24 hours: Temp:  [98.1 F (36.7 C)-98.6 F (37 C)] 98.1 F (36.7 C) (12/30 2019) Pulse Rate:  [64-102] 98 (12/30 2019) Resp:  [15-18] 16 (12/30 2019) BP: (108-158)/(68-107) 132/90 (12/30 2019) SpO2:  [96 %-100 %] 100 % (12/30 2019) Physical Exam  Constitutional: She is oriented to person, place, and time. She appears well-developed and well-nourished.  HENT:  Head: Normocephalic and atraumatic.  Eyes: EOM are  normal. Pupils are equal, round, and reactive to light.  Neck: Normal range of motion. No thyromegaly present.  Cardiovascular: Normal rate and regular rhythm.   Respiratory: Effort normal and breath sounds normal.  GI: Soft. She exhibits no distension.  Musculoskeletal: Normal range of motion. She exhibits no edema.  Neurological: She is alert and oriented to person, place, and time.  Skin: Skin is warm and dry.  Psychiatric: She has a normal mood and affect. Her behavior is normal. Judgment and thought content normal.    Laboratory Data:  Results for orders placed or performed during the hospital encounter of 10/10/16 (from the past 24 hour(s))  Basic metabolic panel     Status: Abnormal   Collection Time: 10/10/16  3:34 AM  Result Value Ref Range   Sodium 136 135 - 145 mmol/L   Potassium 3.8 3.5 - 5.1 mmol/L   Chloride 103 101 - 111 mmol/L   CO2 24 22 - 32 mmol/L   Glucose, Bld 108 (H) 65 - 99 mg/dL   BUN 15 6 - 20 mg/dL   Creatinine, Ser 0.60 0.44 - 1.00 mg/dL   Calcium 9.3 8.9 - 10.3 mg/dL   GFR calc non Af Amer >60 >60 mL/min   GFR calc Af Amer >60 >60 mL/min   Anion gap 9 5 - 15  CBC with Differential/Platelet     Status: Abnormal   Collection Time: 10/10/16  3:34 AM  Result Value Ref Range   WBC 9.7 3.6 - 11.0 K/uL   RBC 4.52 3.80 - 5.20 MIL/uL   Hemoglobin 13.3 12.0 - 16.0 g/dL   HCT 37.1 35.0 - 47.0 %   MCV 82.1 80.0 - 100.0 fL   MCH 29.4 26.0 - 34.0 pg   MCHC 35.7 32.0 - 36.0 g/dL   RDW 13.5 11.5 - 14.5 %   Platelets 330 150 - 440 K/uL   Neutrophils Relative % 74 %   Neutro Abs 7.2 (H) 1.4 - 6.5 K/uL   Lymphocytes Relative 18 %   Lymphs Abs 1.7 1.0 - 3.6 K/uL   Monocytes Relative 6 %   Monocytes Absolute 0.6 0.2 - 0.9 K/uL   Eosinophils Relative 2 %   Eosinophils Absolute 0.2 0 - 0.7 K/uL   Basophils Relative 0 %   Basophils Absolute 0.0 0 - 0.1 K/uL  Urinalysis, Complete w Microscopic     Status: Abnormal   Collection Time: 10/10/16  4:41 AM  Result  Value Ref Range   Color, Urine YELLOW (A) YELLOW   APPearance CLOUDY (A) CLEAR   Specific Gravity, Urine 1.019 1.005 - 1.030   pH 6.0 5.0 - 8.0   Glucose, UA NEGATIVE NEGATIVE mg/dL   Hgb urine dipstick LARGE (A) NEGATIVE   Bilirubin Urine NEGATIVE NEGATIVE   Ketones, ur 20 (A) NEGATIVE mg/dL   Protein, ur 100 (A) NEGATIVE mg/dL   Nitrite NEGATIVE NEGATIVE  Leukocytes, UA TRACE (A) NEGATIVE   RBC / HPF TOO NUMEROUS TO COUNT 0 - 5 RBC/hpf   WBC, UA 6-30 0 - 5 WBC/hpf   Bacteria, UA MANY (A) NONE SEEN   Squamous Epithelial / LPF 6-30 (A) NONE SEEN   Mucous PRESENT    No results found for this or any previous visit (from the past 240 hour(s)). Creatinine:  Recent Labs  10/09/16 0438 10/10/16 0334  CREATININE 0.71 0.60   Baseline Creatinine: 0.7  Impression/Assessment:  [id number] with stent colic, intractable pain  Plan:  1. Admit for pain control. Once pain has improved the patient will be discharged home.   Nicolette Bang 10/10/2016, 10:43 PM

## 2016-10-10 NOTE — Progress Notes (Signed)
Paged Dr Alyson Ingles and asked about pt home medications. Dr ordered to restart BP, but not abx. Also told Dr that pt had decreased appetite d/t pain; Dr ordered NS @ 100. Also notified Dr that pt had stated that she stated during admission that she had had suicidal thoughts; when RN followed up on this with pt, pt stated that she did not have active suicidal thoughts, that she had thought in past about "how I'd do it if I did," but that she did not currently have suicidal intentions or plan. Dr acknowledged and stated not precautions or psych consult to be ordered at this time.

## 2016-10-10 NOTE — ED Provider Notes (Signed)
[facility] Emergency Department Provider Note  ____________________________________________  Time seen: Approximately 2:01 AM  I have reviewed the triage vital signs and the nursing notes.   HISTORY  Chief Complaint Flank Pain   HPI Sonya Bauer is a 25 y.o. female POD 4 of ureteral stent placement and lithotripsy for a 7 mm Left sided kidney stone presents for evaluation of flank pain. Patient reports that she has been in 1-2 Percocets every 5 hours at home. She reports that her pain is severe, describes it as a contraction, constant, located in the left flank, associated with nausea and multiple episodes of nonbloody nonbilious emesis. Patient was seen here yesterday for similar complaints. She was found to have a UTI on urinalysis and was started on Cipro. She was also started on Myrbetriq by her urologist. She has been taking Zofran at home however reports that she hasn't been able to keep anything down. Reports decreased urine production. She denies dysuria, fever, chills, abdominal pain. She had a KUB showing stent in place and no evidence of stones. She is scheduled to have stent removed on 1/4  Past Medical History:  Diagnosis Date  . Complication of anesthesia   . Depression   . Family history of adverse reaction to anesthesia    MOM-HAD SEIZURE DURING SURGERY   . GERD (gastroesophageal reflux disease)    EVERY OTHER DAY  . Headache   . Heart murmur    ASYMPTOMATIC  . Heartburn   . History of kidney stones   . Hypertension   . Pregnancy induced hypertension   . 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 Gross hematuria 09/22/2016  . Nephrolithiasis 09/22/2016  . Family history of endometriosis in first degree relative 09/09/2016  . Hypertension 09/09/2016  . Female dyspareunia 09/09/2016  . Menorrhagia with regular cycle 09/09/2016  . Vitamin D deficiency 07/31/2016  . Vaginal delivery 07/03/2015  .  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 Elevated blood pressure reading 05/08/2015    Past Surgical History:  Procedure Laterality Date  . CYSTOSCOPY WITH STENT PLACEMENT Left 10/06/2016   Procedure: CYSTOSCOPY WITH STENT PLACEMENT;  Surgeon: Alexis Frock, MD;  Location: [facility] ORS;  Service: Urology;  Laterality: Left;  . URETEROSCOPY WITH HOLMIUM LASER LITHOTRIPSY Left 10/06/2016   Procedure: URETEROSCOPY WITH HOLMIUM LASER LITHOTRIPSY;  Surgeon: Alexis Frock, MD;  Location: [facility] ORS;  Service: Urology;  Laterality: Left;  . WISDOM TOOTH EXTRACTION     AGE 76    Prior to Admission medications   Medication Sig Start Date End Date Taking? Authorizing Provider  acyclovir (ZOVIRAX) 200 MG capsule Take 200 mg by mouth as needed.    Historical Provider, MD  amLODipine (NORVASC) 5 MG tablet Take 1 tablet (5 mg total) by mouth daily. Patient taking differently: Take 5 mg by mouth every morning.  07/30/16   Melody N Shambley, CNM  cephALEXin (KEFLEX) 500 MG capsule Take 1 capsule (500 mg total) by mouth 2 (two) times daily. X 3 days. Begin day prior to next Urology appointment. 10/06/16   Alexis Frock, MD  cetirizine (ZYRTEC) 10 MG tablet Take 10 mg by mouth at bedtime.    Historical Provider, MD  ciprofloxacin (CIPRO) 500 MG tablet Take 1 tablet (500 mg total) by mouth 2 (two) times daily. 10/09/16 10/16/16  Gregor Hams, MD  hydrochlorothiazide (HYDRODIURIL) 25 MG tablet Take 1 tablet (25 mg total) by mouth daily. 07/30/16   Melody  N Shambley, CNM  ketoprofen (ORUDIS) 50 MG capsule Take 1 capsule (50 mg total) by mouth 4 (four) times daily. 10/10/16   Rudene Re, MD  ketorolac (TORADOL) 10 MG tablet Take 1 tablet (10 mg total) by mouth every 6 (six) hours as needed for moderate pain. Or sent discomfort post-operatively. 10/06/16   Alexis Frock, MD  oxyCODONE (ROXICODONE) 5 MG immediate release tablet Take 1 tablet (5 mg total) by mouth every 8 (eight) hours as needed. 10/10/16 10/10/17  Rudene Re, MD  oxyCODONE-acetaminophen (ROXICET) 5-325 MG tablet Take 1-2 tablets by mouth every 6 (six) hours as needed for severe pain. Post-operatively 10/06/16   Alexis Frock, MD  Saccharomyces boulardii (PROBIOTIC) 250 MG CAPS Take by mouth.    Historical Provider, MD  senna-docusate (SENOKOT-S) 8.6-50 MG tablet Take 1 tablet by mouth 2 (two) times daily. While taking strongest pain meds to prevent constipation. 10/06/16   Alexis Frock, MD  tamsulosin (FLOMAX) 0.4 MG CAPS capsule Take 1 capsule (0.4 mg total) by mouth daily. 10/10/16   Rudene Re, MD    Allergies Azithromycin  Family History  Problem Relation Age of Onset  . Hypertension Father   . Kidney disease Neg Hx   . Bladder Cancer Neg Hx     Social History Social History  Substance Use Topics  . Smoking status: Never Smoker  . Smokeless tobacco: Never Used  . Alcohol use No    Review of Systems  Constitutional: Negative for fever. Eyes: Negative for visual changes. ENT: Negative for sore throat. Neck: No neck pain  Cardiovascular: Negative for chest pain. Respiratory: Negative for shortness of breath. Gastrointestinal: Negative for abdominal pain, vomiting or diarrhea. Genitourinary: Negative for dysuria. + L flank pain Musculoskeletal: Negative for back pain. Skin: Negative for rash. Neurological: Negative for headaches, weakness or numbness. Psych: No SI or HI  ____________________________________________   PHYSICAL EXAM:  VITAL SIGNS: ED Triage Vitals  Enc Vitals Group     BP 10/09/16 2149 (!) 162/98     Pulse Rate 10/09/16 2149 (!) 108     Resp --      Temp 10/09/16 2149 98.4 F (36.9 C)     Temp Source 10/09/16 2149 Oral     SpO2 10/09/16 2149 97 %     Weight 10/09/16 2149 165 lb (74.8 kg)     Height 10/09/16 2149 5\' 5"  (1.651 m)     Head Circumference --      Peak Flow --      Pain Score 10/09/16 2153 10     Pain Loc --      Pain Edu? --      Excl. in [place]? --      Constitutional: Alert and oriented. Well appearing and in no apparent distress. HEENT:      Head: Normocephalic and atraumatic.         Eyes: Conjunctivae are normal. Sclera is non-icteric. EOMI. PERRL      Mouth/Throat: Mucous membranes are moist.       Neck: Supple with no signs of meningismus. Cardiovascular: Tachycardic with regular rhythm. No murmurs, gallops, or rubs. 2+ symmetrical distal pulses are present in all extremities. No JVD. Respiratory: Normal respiratory effort. Lungs are clear to auscultation bilaterally. No wheezes, crackles, or rhonchi.  Gastrointestinal: Soft, non tender, and non distended with positive bowel sounds. No rebound or guarding. Genitourinary: Left CVA tenderness. Musculoskeletal: Nontender with normal range of motion in all extremities. No edema, cyanosis, or erythema of extremities. Neurologic:  Normal speech and language. Face is symmetric. Moving all extremities. No gross focal neurologic deficits are appreciated. Skin: Skin is warm, dry and intact. No rash noted. Psychiatric: Mood and affect are normal. Speech and behavior are normal.  ____________________________________________   LABS (all labs ordered are listed, but only abnormal results are displayed)  Labs Reviewed  BASIC METABOLIC PANEL - Abnormal; Notable for the following:       Result Value   Glucose, Bld 108 (*)    All other components within normal limits  CBC WITH DIFFERENTIAL/PLATELET - Abnormal; Notable for the following:    Neutro Abs 7.2 (*)    All other components within normal limits  URINALYSIS, COMPLETE (UACMP) WITH MICROSCOPIC - Abnormal; Notable for the following:    Color, Urine YELLOW (*)    APPearance CLOUDY (*)    Hgb urine dipstick LARGE (*)    Ketones, ur 20 (*)    Protein, ur 100 (*)    Leukocytes, UA TRACE (*)    Bacteria, UA MANY (*)    Squamous Epithelial / LPF 6-30 (*)    All other components within normal limits  URINE CULTURE    ____________________________________________  EKG  none ____________________________________________  RADIOLOGY  Renal US: Mild left-sided hydronephrosis with left-sided ureteral stent noted. No demonstrable calculus along the visualized stent nor within the left renal collecting system on current study. No demonstrable left-sided ureteral jet however is from the stent is seen. Persistent mild left-sided hydronephrosis after the patient's past empty her bladder. Cannot exclude occluded stent. ____________________________________________   PROCEDURES  Procedure(s) performed: None Procedures Critical Care performed:  None ____________________________________________   INITIAL IMPRESSION / ASSESSMENT AND PLAN / ED COURSE  25 y.o. female POD 4 of ureteral stent placement and lithotripsy for a 7 mm Left sided kidney stone presents for evaluation of flank pain. This is patient's second visit to the emergency room for pain. She is currently on Bactrim for an UTI. We'll do a renal ultrasound to evaluate for stent patency. Since the urine culture was not done yesterday we'll send a repeat urinalysis and culture at this time to make sure the antibiotic is appropriate. We'll check patient's kidney function. We'll give her IV fluids, IV morphine, and IV Zofran for her symptoms.  Clinical Course as of Oct 10 737  Sat Oct 10, 2016  [id number] I discussed the results of the ultrasound as well as patient's presentation and blood work with Dr. Junious Silk who recommended pain control, start patient on standing toradol, flomax and dc home. He reports that US findings are normal after stent placement and not concerning at this time for stent obstruction. Patient continues to complain of severe 10 out of 10 pain after a dose of IV morphine and Toradol. We'll give her a dose of IV Dilaudid and start patient on by mouth medications with 1000 mg of Tylenol and 5 of oxycodone. We'll also give her 0.4 mg of  Flomax and reassess.  [CV]  [phone] Plan to reassess pain 40 min after PO meds and if pain is controlled will dc home on standing high-dose Tylenol, standing Toradol, standing Flomax, and oxycodone when necessary. Care transferred to Dr. Reita Cliche.  [CV]    Clinical Course User Index [CV] Rudene Re, MD    Pertinent labs & imaging results that were available during my care of the patient were reviewed by me and considered in my medical decision making (see chart for details).    ____________________________________________   FINAL CLINICAL IMPRESSION(S) /  ED DIAGNOSES  Final diagnoses:  Left flank pain      NEW MEDICATIONS STARTED DURING THIS VISIT:  New Prescriptions   KETOPROFEN (ORUDIS) 50 MG CAPSULE    Take 1 capsule (50 mg total) by mouth 4 (four) times daily.   OXYCODONE (ROXICODONE) 5 MG IMMEDIATE RELEASE TABLET    Take 1 tablet (5 mg total) by mouth every 8 (eight) hours as needed.   TAMSULOSIN (FLOMAX) 0.4 MG CAPS CAPSULE    Take 1 capsule (0.4 mg total) by mouth daily.     Note:  This document was prepared using Dragon voice recognition software and may include unintentional dictation errors.    Rudene Re, MD 10/10/16 [phone]

## 2016-10-10 NOTE — ED Provider Notes (Signed)
Patient is still having pain despite multiple treatments here in the emergency department. I discussed with her consultation with urology for likely admission due to ongoing symptoms.  He spoke with urologist Dr. Noah Delaine by phone who come to see the patient here in the ED. He did recommend Valium by mouth.   Lisa Roca, MD 10/10/16 [phone]

## 2016-10-11 LAB — URINE CULTURE: CULTURE: NO GROWTH

## 2016-10-11 MED ORDER — HYDROMORPHONE HCL 2 MG PO TABS
4.0000 mg | ORAL_TABLET | ORAL | Status: DC | PRN
  Administered 2016-10-11 – 2016-10-13 (×18): 4 mg via ORAL
  Filled 2016-10-11 (×19): qty 2

## 2016-10-11 MED ORDER — POLYETHYLENE GLYCOL 3350 17 G PO PACK
17.0000 g | PACK | Freq: Once | ORAL | Status: AC
  Administered 2016-10-11: 17 g via ORAL
  Filled 2016-10-11: qty 1

## 2016-10-12 DIAGNOSIS — Z87442 Personal history of urinary calculi: Secondary | ICD-10-CM | POA: Diagnosis not present

## 2016-10-12 DIAGNOSIS — R1032 Left lower quadrant pain: Secondary | ICD-10-CM | POA: Diagnosis not present

## 2016-10-12 MED ORDER — LACTULOSE 10 GM/15ML PO SOLN
30.0000 g | Freq: Once | ORAL | Status: AC
  Administered 2016-10-12: 30 g via ORAL
  Filled 2016-10-12: qty 60

## 2016-10-13 ENCOUNTER — Telehealth: Payer: Self-pay | Admitting: Urology

## 2016-10-13 ENCOUNTER — Other Ambulatory Visit: Payer: Self-pay | Admitting: Urology

## 2016-10-13 DIAGNOSIS — R109 Unspecified abdominal pain: Secondary | ICD-10-CM

## 2016-10-13 MED ORDER — LACTULOSE 10 GM/15ML PO SOLN
30.0000 g | Freq: Two times a day (BID) | ORAL | Status: DC
  Administered 2016-10-13: 30 g via ORAL
  Filled 2016-10-13: qty 60

## 2016-10-13 MED ORDER — OXYBUTYNIN CHLORIDE ER 10 MG PO TB24: 10.0000 mg | ORAL_TABLET | Freq: Every day | ORAL | 0 refills | Status: DC

## 2016-10-13 MED ORDER — HYOSCYAMINE SULFATE 0.125 MG SL SUBL: 0.1250 mg | SUBLINGUAL_TABLET | SUBLINGUAL | 0 refills | Status: DC | PRN

## 2016-10-13 MED ORDER — ONDANSETRON 4 MG PO TBDP: 4.0000 mg | ORAL_TABLET | Freq: Three times a day (TID) | ORAL | 0 refills | Status: DC | PRN

## 2016-10-13 NOTE — Telephone Encounter (Signed)
Amy has made appt and ln for pt to cb

## 2016-10-13 NOTE — Telephone Encounter (Signed)
-----   Message from Royanne Foots, [place] sent at 10/08/2016  2:04 PM EST ----- Regarding: FW: Post-op follow up   ----- Message ----- From: Alexis Frock, MD Sent: 10/06/2016  12:04 PM To: Wilson Singer, CMA Subject: Post-op follow up                               [name] needs f/u in about 2-3 weeks for MD visit and cysto/stent removal. Had ureteroscopy for stone 10/06/16.  Thanks, T [place]

## 2016-10-13 NOTE — Progress Notes (Signed)
  Subjective: Patient reports nausea, passing flatus, flank pain moderate and pain control poor.  She received 16 mg of Dilaudid overnight.  She has not had a BM.  She states her pain is moderately controlled.    Objective: Vital signs in last 24 hours: Temp:  [98.1 F (36.7 C)-98.5 F (36.9 C)] 98.1 F (36.7 C) (01/02 0524) Pulse Rate:  [74-86] 81 (01/02 0524) Resp:  [18] 18 (01/02 0524) BP: (125-144)/(78-95) 125/78 (01/02 0524) SpO2:  [99 %-100 %] 99 % (01/02 0524)  Intake/Output from previous day: 01/01 0701 - 01/02 0700 In: 2527.8 [P.O.:150; I.V.:2377.8] Out: 1000 [Urine:1000] Intake/Output this shift: No intake/output data recorded.  Physical Exam:  Constitutional: Well nourished. Alert and oriented, No acute distress. HEENT: [place] AT, moist mucus membranes. Trachea midline, no masses. Cardiovascular: No clubbing, cyanosis, or edema. Respiratory: Normal respiratory effort, no increased work of breathing. GI: Abdomen is soft, non tender, non distended, no abdominal masses. Liver and spleen not palpable.  No hernias appreciated.  Stool sample for occult testing is not indicated.   Skin: No rashes, bruises or suspicious lesions. Lymph: No cervical or inguinal adenopathy. Neurologic: Grossly intact, no focal deficits, moving all 4 extremities. Psychiatric: Normal mood and affect.  Lab Results: No results for input(s): HGB, HCT in the last 72 hours. BMET No results for input(s): NA, K, CL, CO2, GLUCOSE, BUN, CREATININE, CALCIUM in the last 72 hours. No results for input(s): LABPT, INR in the last 72 hours. No results for input(s): LABURIN in the last 72 hours. Results for orders placed or performed during the hospital encounter of 10/10/16  Urine culture     Status: None   Collection Time: 10/10/16  4:41 AM  Result Value Ref Range Status   Specimen Description URINE, RANDOM  Final   Special Requests NONE  Final   Culture NO GROWTH Performed at [facility]   Final    Report Status 10/11/2016 FINAL  Final    Studies/Results: No results found.  Assessment/Plan: Q000111Q with stent colic and constipation:  1. Stent colic: switch pain medication to dilaudid PO, patient has an appointment on 10/15/2016 for cystoscopy and stent removal  2. Constipation: lactulose BID    LOS: 0 days   Jb [place] 10/13/2016, 8:23 AM

## 2016-10-13 NOTE — Progress Notes (Signed)
Pt d/c to home today. IV removed intact.  Rx's given to pt w/all questions and concerns addressed.  D/C paperwork reviewed and education provided with all questions and concerns addressed.  Pt wife at bedside for home transport.  Volunteer services contact for transportation from room to exit.

## 2016-10-13 NOTE — Progress Notes (Signed)
  Subjective: Patient reports nausea, passing flatus, flank pain moderate and pain control poor.  Objective: Vital signs in last 24 hours: Temp:  [98.1 F (36.7 C)-98.5 F (36.9 C)] 98.1 F (36.7 C) (01/02 0524) Pulse Rate:  [74-86] 81 (01/02 0524) Resp:  [18] 18 (01/02 0524) BP: (125-144)/(78-95) 125/78 (01/02 0524) SpO2:  [99 %-100 %] 99 % (01/02 0524)  Intake/Output from previous day: 01/01 0701 - 01/02 0700 In: 2527.8 [P.O.:150; I.V.:2377.8] Out: 1000 [Urine:1000] Intake/Output this shift: No intake/output data recorded.  Physical Exam:  General:alert, cooperative and appears stated age GI: soft, non tender, normal bowel sounds, no palpable masses, no organomegaly, no inguinal hernia Female genitalia: not done Extremities: extremities normal, atraumatic, no cyanosis or edema  Lab Results: No results for input(s): HGB, HCT in the last 72 hours. BMET No results for input(s): NA, K, CL, CO2, GLUCOSE, BUN, CREATININE, CALCIUM in the last 72 hours. No results for input(s): LABPT, INR in the last 72 hours. No results for input(s): LABURIN in the last 72 hours. Results for orders placed or performed during the hospital encounter of 10/10/16  Urine culture     Status: None   Collection Time: 10/10/16  4:41 AM  Result Value Ref Range Status   Specimen Description URINE, RANDOM  Final   Special Requests NONE  Final   Culture NO GROWTH Performed at [facility]   Final   Report Status 10/11/2016 FINAL  Final    Studies/Results: No results found.  Assessment/Plan: Q000111Q with stent colic and constipation:  1. Stent colic: switch pain medication to dilaudid PO  2. Constipation: lactulose BID   LOS: 0 days   Nicolette Bang 10/13/2016, 7:58 AM

## 2016-10-13 NOTE — Discharge Instructions (Signed)
Pain control: Take tylenol 1000mg  every 8 hours. Take toradol 50mg  every 6 hours. Take flomax once a day. Take 5mg  of oxycodone every 6 hours for breakthrough pain. If you need the oxycodone make sure to take one senokot as well to prevent constipation.  Do not drink alcohol, drive or participate in any other potentially dangerous activities while taking this medication as it may make you sleepy. Do not take this medication with any other sedating medications, either prescription or over-the-counter.  Keep your appointment on 10/15/2016 at [facility] for stent removal

## 2016-10-14 NOTE — Discharge Summary (Signed)
Date of admission: 10/10/2016  Date of discharge: 10/14/2016  Admission diagnosis: Ureteral stent colic  Discharge diagnosis: Ureteral stent colic  Secondary diagnoses:  Patient Active Problem List   Diagnosis Date Noted  . Complication of urinary stent ([place]) 10/10/2016  . Gross hematuria 09/22/2016  . Nephrolithiasis 09/22/2016  . Family history of endometriosis in first degree relative 09/09/2016  . Hypertension 09/09/2016  . Female dyspareunia 09/09/2016  . Menorrhagia with regular cycle 09/09/2016  . Vitamin D deficiency 07/31/2016  . Vaginal delivery 07/03/2015  . 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 Elevated blood pressure reading 05/08/2015    History and Physical: For full details, please see admission history and physical. Briefly, Sonya Bauer is a 26 y.o. year old patient with ureteral stent colic was admitted for pain control due to intractable left flank pain.    Hospital Course:   Her hospital course was uncomplicated.  She received oral Dilaudid and Zofran over a course of two days on a consistent basis for pain control.  She was afebrile and VSS during her stay.  On hospital day three, she had met discharge criteria: was eating a regular diet, was up and ambulating independently,  pain was well controlled, was voiding without a catheter, and was ready to for discharge.   Laboratory values:  No results for input(s): WBC, HGB, HCT in the last 72 hours. No results for input(s): NA, K, CL, CO2, GLUCOSE, BUN, CREATININE, CALCIUM in the last 72 hours. No results for input(s): LABPT, INR in the last 72 hours. No results for input(s): LABURIN in the last 72 hours. Results for orders placed or performed during the hospital encounter of 10/10/16  Urine culture     Status: None   Collection Time: 10/10/16  4:41 AM  Result Value Ref Range Status   Specimen Description URINE, RANDOM  Final   Special Requests NONE  Final   Culture NO GROWTH Performed at [facility]   Final    Report Status 10/11/2016 FINAL  Final    Disposition: Home  Discharge instruction: The patient was instructed to be ambulatory but told to refrain from heavy lifting, strenuous activity, or driving.   She is scheduled for cystoscopy and stent removal on 10/15/2016 in office.  Discharge medications:  Allergies as of 10/13/2016      Reactions   Azithromycin Nausea And Vomiting      Medication List    STOP taking these medications   ketorolac 10 MG tablet Commonly known as:  TORADOL     TAKE these medications   acyclovir 200 MG capsule Commonly known as:  ZOVIRAX Take 200 mg by mouth as needed.   amLODipine 5 MG tablet Commonly known as:  NORVASC Take 1 tablet (5 mg total) by mouth daily.   cephALEXin 500 MG capsule Commonly known as:  KEFLEX Take 1 capsule (500 mg total) by mouth 2 (two) times daily. X 3 days. Begin day prior to next Urology appointment.   cetirizine 10 MG tablet Commonly known as:  ZYRTEC Take 10 mg by mouth at bedtime.   hydrochlorothiazide 25 MG tablet Commonly known as:  HYDRODIURIL Take 1 tablet (25 mg total) by mouth daily.   ketoprofen 50 MG capsule Commonly known as:  ORUDIS Take 1 capsule (50 mg total) by mouth 4 (four) times daily.   ondansetron 4 MG disintegrating tablet Commonly known as:  ZOFRAN ODT Take 1 tablet (4 mg total) by mouth every 8 (eight) hours as needed for nausea or  vomiting.   oxyCODONE 5 MG immediate release tablet Commonly known as:  ROXICODONE Take 1 tablet (5 mg total) by mouth every 8 (eight) hours as needed.   oxyCODONE-acetaminophen 5-325 MG tablet Commonly known as:  ROXICET Take 1-2 tablets by mouth every 6 (six) hours as needed for severe pain. Post-operatively   Probiotic 250 MG Caps Take by mouth.   senna-docusate 8.6-50 MG tablet Commonly known as:  Senokot-S Take 1 tablet by mouth 2 (two) times daily. While taking strongest pain meds to prevent constipation.   tamsulosin 0.4 MG Caps  capsule Commonly known as:  FLOMAX Take 1 capsule (0.4 mg total) by mouth daily.       Followup:   Patient is to follow up on 10/15/2016 for cystoscopy with stent removal on 10/15/2016.  Advised to contact our office or seek treatment in the ED if becomes febrile or pain/ vomiting are difficult control in order to arrange for emergent/urgent intervention

## 2016-10-15 ENCOUNTER — Encounter: Payer: Self-pay | Admitting: Urology

## 2016-10-15 ENCOUNTER — Ambulatory Visit (INDEPENDENT_AMBULATORY_CARE_PROVIDER_SITE_OTHER): Payer: Medicaid Other | Admitting: Urology

## 2016-10-15 DIAGNOSIS — N200 Calculus of kidney: Secondary | ICD-10-CM

## 2016-10-15 NOTE — Progress Notes (Signed)
   10/15/16  CC:  Chief Complaint  Patient presents with  . Cysto Stent Removal    HPI: The patient is a 26 year old female who underwent ureteroscopic stone extraction on 10/06/2016 who presents today for left ureteral stent removal. She did present to the hospital multiple times due to poor tolerance of the stent.  Last menstrual period 09/20/2016, not currently breastfeeding. NED. A&Ox3.   No respiratory distress   Abd soft, NT, ND Normal external genitalia with patent urethral meatus  Cystoscopy Procedure Note  Patient identification was confirmed, informed consent was obtained, and patient was prepped using Betadine solution.  Lidocaine jelly was administered per urethral meatus.    Preoperative abx where received prior to procedure.    Procedure: - Flexible cystoscope introduced, without any difficulty.   - Thorough search of the bladder revealed:    normal urethral meatus    normal urothelium  Left ureteral stent removed intact with flexible graspers.  - Ureteral orifices were normal in position and appearance.  Post-Procedure: - Patient tolerated the procedure well  Assessment/ Plan:  1. Left nephrolithiasis Follow-up in 1 month with renal ultrasound prior to go over stone analysis and with renal ultrasound prior to rule out iatrogenic hydronephrosis.  Nickie Retort, MD

## 2016-10-19 LAB — STONE ANALYSIS
CA PHOS CRY STONE QL IR: 3 %
Ca Oxalate,Dihydrate: 30 %
Ca Oxalate,Monohydr.: 67 %
Stone Weight KSTONE: 14.7 mg

## 2016-10-20 NOTE — Anesthesia Postprocedure Evaluation (Signed)
Anesthesia Post Note  Patient: Sula Soda  Procedure(s) Performed: Procedure(s) (LRB): URETEROSCOPY WITH HOLMIUM LASER LITHOTRIPSY (Left) CYSTOSCOPY WITH STENT PLACEMENT (Left)  Patient location during evaluation: PACU Anesthesia Type: General Level of consciousness: awake and alert Pain management: pain level controlled Vital Signs Assessment: post-procedure vital signs reviewed and stable Respiratory status: spontaneous breathing, nonlabored ventilation, respiratory function stable and patient connected to nasal cannula oxygen Cardiovascular status: blood pressure returned to baseline and stable Postop Assessment: no signs of nausea or vomiting Anesthetic complications: no     Last Vitals:  Vitals:   10/06/16 1321 10/06/16 1436  BP: (!) 128/95 (!) 124/91  Pulse: 79 70  Resp: 16 16  Temp: 36.6 C     Last Pain:  Vitals:   10/07/16 1002  TempSrc:   PainSc: 5                  Molli Barrows

## 2016-10-29 ENCOUNTER — Encounter: Payer: Medicaid Other | Admitting: Obstetrics and Gynecology

## 2016-11-02 ENCOUNTER — Ambulatory Visit: Admit: 2016-11-02 | Payer: Medicaid Other | Admitting: Obstetrics and Gynecology

## 2016-11-02 SURGERY — LAPAROSCOPY, DIAGNOSTIC
Anesthesia: General

## 2016-11-06 ENCOUNTER — Ambulatory Visit: Payer: Medicaid Other

## 2016-11-10 ENCOUNTER — Ambulatory Visit
Admission: RE | Admit: 2016-11-10 | Discharge: 2016-11-10 | Disposition: A | Discharge status: Home / Self Care | Payer: Medicaid Other | Source: Ambulatory Visit | Attending: Urology | Admitting: Urology

## 2016-11-10 DIAGNOSIS — N200 Calculus of kidney: Secondary | ICD-10-CM

## 2016-11-10 DIAGNOSIS — Z87442 Personal history of urinary calculi: Secondary | ICD-10-CM | POA: Diagnosis not present

## 2016-11-10 DIAGNOSIS — K760 Fatty (change of) liver, not elsewhere classified: Secondary | ICD-10-CM | POA: Diagnosis not present

## 2016-11-11 ENCOUNTER — Encounter: Payer: Self-pay | Admitting: Obstetrics and Gynecology

## 2016-11-11 ENCOUNTER — Other Ambulatory Visit: Payer: Self-pay | Admitting: Obstetrics and Gynecology

## 2016-11-11 ENCOUNTER — Ambulatory Visit (INDEPENDENT_AMBULATORY_CARE_PROVIDER_SITE_OTHER): Payer: Medicaid Other | Admitting: Obstetrics and Gynecology

## 2016-11-11 VITALS — BP 142/94 | HR 88 | Ht 65.0 in | Wt 161.8 lb

## 2016-11-11 DIAGNOSIS — R12 Heartburn: Secondary | ICD-10-CM | POA: Diagnosis not present

## 2016-11-11 DIAGNOSIS — Z01419 Encounter for gynecological examination (general) (routine) without abnormal findings: Secondary | ICD-10-CM

## 2016-11-11 DIAGNOSIS — Z0000 Encounter for general adult medical examination without abnormal findings: Secondary | ICD-10-CM

## 2016-11-11 MED ORDER — RANITIDINE HCL 150 MG PO TABS: 150.0000 mg | ORAL_TABLET | Freq: Two times a day (BID) | ORAL | 5 refills | Status: DC

## 2016-11-11 NOTE — Progress Notes (Signed)
   Subjective:     Sonya Bauer is a 26 y.o. female and is here for a comprehensive physical exam. The patient reports problems - heartburn with am nausea,. Single caucasain female, condoms use.  Social History   Social History  . Marital status: Single    Spouse name: N/A  . Number of children: N/A  . Years of education: N/A   Occupational History  . Not on file.   Social History Main Topics  . Smoking status: Never Smoker  . Smokeless tobacco: Never Used  . Alcohol use No  . Drug use: No  . Sexual activity: Yes    Birth control/ protection: None, Condom     Comment: mirena   Other Topics Concern  . Not on file   Social History Narrative  . No narrative on file   Health Maintenance  Topic Date Due  . TETANUS/TDAP  07/20/2010  . INFLUENZA VACCINE  05/12/2016  . PAP SMEAR  11/07/2018  . HIV Screening  Completed    The following portions of the patient's history were reviewed and updated as appropriate: allergies, current medications, past family history, past medical history, past social history, past surgical history and problem list.  Review of Systems Pertinent items noted in HPI and remainder of comprehensive ROS otherwise negative.   Objective:    General appearance: alert, cooperative and appears stated age Neck: no adenopathy, no carotid bruit, no JVD, supple, symmetrical, trachea midline and thyroid not enlarged, symmetric, no tenderness/mass/nodules Lungs: clear to auscultation bilaterally Breasts: normal appearance, no masses or tenderness Heart: regular rate and rhythm, S1, S2 normal, no murmur, click, rub or gallop Abdomen: soft, non-tender; bowel sounds normal; no masses,  no organomegaly Pelvic: cervix normal in appearance, external genitalia normal, no adnexal masses or tenderness, no cervical motion tenderness, rectovaginal septum normal, uterus normal size, shape, and consistency and vagina normal without discharge    Assessment:    Healthy  female exam. heartburn     Plan:     See After Visit Summary for Counseling Recommendations

## 2016-11-13 ENCOUNTER — Encounter: Payer: Self-pay | Admitting: Urology

## 2016-11-13 ENCOUNTER — Ambulatory Visit (INDEPENDENT_AMBULATORY_CARE_PROVIDER_SITE_OTHER): Payer: Medicaid Other | Admitting: Urology

## 2016-11-13 VITALS — BP 129/88 | HR 77 | Ht 65.0 in | Wt 160.6 lb

## 2016-11-13 DIAGNOSIS — N200 Calculus of kidney: Secondary | ICD-10-CM | POA: Diagnosis not present

## 2016-11-13 NOTE — Progress Notes (Signed)
11/13/2016 4:23 PM   Sonya Bauer [birth year] [id number]  Referring provider: [place], [place] [id number]  Chief Complaint  Patient presents with  . Follow-up    Renal US results    HPI: The patient is a 26 year old female who underwent ureteroscopic stone extraction on 10/06/2016 who presents today to go over her post op post instrumentation renal ultrasound.  This was negative for hydronephrosis. This was her second stone. Her stone analysis showed 67% calcium oxalate monohydrate, 30% calcium oxalate dihydrate, and 3% calcium phosphate.  This is her second stone. It did happen shortly after pregnancy. She had her first stone when she was a teenager.  PMH: Past Medical History:  Diagnosis Date  . Complication of anesthesia   . Depression   . Family history of adverse reaction to anesthesia    MOM-HAD SEIZURE DURING SURGERY   . GERD (gastroesophageal reflux disease)    EVERY OTHER DAY  . Headache   . Heart murmur    ASYMPTOMATIC  . Heartburn   . History of kidney stones   . Hypertension   . Pregnancy induced hypertension   . Thyroid cyst 11/16/14    Surgical History: Past Surgical History:  Procedure Laterality Date  . CYSTOSCOPY WITH STENT PLACEMENT Left 10/06/2016   Procedure: CYSTOSCOPY WITH STENT PLACEMENT;  Surgeon: Alexis Frock, MD;  Location: [facility];  Service: Urology;  Laterality: Left;  . URETEROSCOPY WITH HOLMIUM LASER LITHOTRIPSY Left 10/06/2016   Procedure: URETEROSCOPY WITH HOLMIUM LASER LITHOTRIPSY;  Surgeon: Alexis Frock, MD;  Location: [facility];  Service: Urology;  Laterality: Left;  . WISDOM TOOTH EXTRACTION     AGE 36    Home Medications:  Allergies as of 11/13/2016      Reactions   Azithromycin Nausea And Vomiting      Medication List       Accurate as of 11/13/16  4:23 PM. Always use your most recent med list.          acyclovir 200 MG capsule Commonly known as:  ZOVIRAX Take 200 mg by mouth as  needed.   amLODipine 5 MG tablet Commonly known as:  NORVASC Take 1 tablet (5 mg total) by mouth daily.   cetirizine 10 MG tablet Commonly known as:  ZYRTEC Take 10 mg by mouth at bedtime.   hydrochlorothiazide 25 MG tablet Commonly known as:  HYDRODIURIL Take 1 tablet (25 mg total) by mouth daily.   hyoscyamine 0.125 MG SL tablet Commonly known as:  LEVSIN SL DISSOLVE 1 TABLET(0.125 MG) UNDER THE TONGUE EVERY 4 HOURS AS NEEDED FOR CRAMPING   ranitidine 150 MG tablet Commonly known as:  ZANTAC Take 1 tablet (150 mg total) by mouth 2 (two) times daily.       Allergies:  Allergies  Allergen Reactions  . Azithromycin Nausea And Vomiting    Family History: Family History  Problem Relation Age of Onset  . Hypertension Father   . Kidney disease Neg Hx   . Bladder Cancer Neg Hx     Social History:  reports that she has never smoked. She has never used smokeless tobacco. She reports that she does not drink alcohol or use drugs.  ROS: UROLOGY Frequent Urination?: No Hard to postpone urination?: No Burning/pain with urination?: No Get up at night to urinate?: No Leakage of urine?: No Urine stream starts and stops?: No Trouble starting stream?: No Do you have to strain to urinate?: No Blood in urine?: No Urinary  tract infection?: No Sexually transmitted disease?: No Injury to kidneys or bladder?: No Painful intercourse?: No Weak stream?: No Currently pregnant?: No Vaginal bleeding?: No Last menstrual period?: n  Gastrointestinal Nausea?: Yes Vomiting?: No Indigestion/heartburn?: Yes Diarrhea?: No Constipation?: No  Constitutional Fever: No Night sweats?: No Weight loss?: No Fatigue?: No  Skin Skin rash/lesions?: No Itching?: No  Eyes Blurred vision?: No Double vision?: No  Ears/Nose/Throat Sore throat?: No Sinus problems?: No  Hematologic/Lymphatic Swollen glands?: No Easy bruising?: No  Cardiovascular Leg swelling?: No Chest pain?:  No  Respiratory Cough?: No Shortness of breath?: No  Endocrine Excessive thirst?: No  Musculoskeletal Back pain?: No Joint pain?: No  Neurological Headaches?: No Dizziness?: No  Psychologic Depression?: No Anxiety?: No  Physical Exam: BP 129/88 (BP Location: Left Arm, Patient Position: Sitting, Cuff Size: Normal)   Pulse 77   Ht 5\' 5"  (1.651 m)   Wt 160 lb 9.6 oz (72.8 kg)   LMP 11/12/2016   BMI 26.73 kg/m   Constitutional:  Alert and oriented, No acute distress. HEENT: [place] AT, moist mucus membranes.  Trachea midline, no masses. Cardiovascular: No clubbing, cyanosis, or edema. Respiratory: Normal respiratory effort, no increased work of breathing. GI: Abdomen is soft, nontender, nondistended, no abdominal masses GU: No CVA tenderness.  Skin: No rashes, bruises or suspicious lesions. Lymph: No cervical or inguinal adenopathy. Neurologic: Grossly intact, no focal deficits, moving all 4 extremities. Psychiatric: Normal mood and affect.  Laboratory Data: Lab Results  Component Value Date   WBC 9.7 10/10/2016   HGB 13.3 10/10/2016   HCT 37.1 10/10/2016   MCV 82.1 10/10/2016   PLT 330 10/10/2016    Lab Results  Component Value Date   CREATININE 0.60 10/10/2016    No results found for: PSA  No results found for: TESTOSTERONE  No results found for: HGBA1C  Urinalysis    Component Value Date/Time   COLORURINE YELLOW (A) 10/10/2016 0441   APPEARANCEUR CLOUDY (A) 10/10/2016 0441   APPEARANCEUR Cloudy (A) 06/01/2016 1557   LABSPEC 1.019 10/10/2016 0441   PHURINE 6.0 10/10/2016 0441   GLUCOSEU NEGATIVE 10/10/2016 0441   HGBUR LARGE (A) 10/10/2016 0441   BILIRUBINUR NEGATIVE 10/10/2016 0441   BILIRUBINUR NEG 09/09/2016 1427   BILIRUBINUR Negative 06/01/2016 1557   KETONESUR 20 (A) 10/10/2016 0441   PROTEINUR 100 (A) 10/10/2016 0441   UROBILINOGEN negative 09/09/2016 1427   NITRITE NEGATIVE 10/10/2016 0441   LEUKOCYTESUR TRACE (A) 10/10/2016 0441    LEUKOCYTESUR Trace (A) 06/01/2016 1557    Pertinent Imaging: Renal ultrasound reviewed as above. No hydronephrosis.  Assessment & Plan:    1. Nephrolithiasis The patient has no further evidence of stone burden. I went over her stone analysis results and the ABCs of preventing stone formation. We did discuss that this was her second stone in that one possibility would be to undergo 24-hour urine collection. We discussed the pros and cons of this. The patient has elected not to proceed with this. She is encouraged to drink more water. She will follow-up with [place] as needed.  Return if symptoms worsen or fail to improve.  Nickie Retort, MD  [facility] [address], [place], [place] [address] [phone]

## 2016-11-16 LAB — CYTOLOGY - PAP

## 2016-12-05 ENCOUNTER — Other Ambulatory Visit: Payer: Self-pay | Admitting: Obstetrics and Gynecology

## 2017-01-05 ENCOUNTER — Encounter: Payer: Self-pay | Admitting: Obstetrics and Gynecology

## 2017-01-13 ENCOUNTER — Encounter: Payer: Self-pay | Admitting: Obstetrics and Gynecology

## 2017-01-13 ENCOUNTER — Ambulatory Visit (INDEPENDENT_AMBULATORY_CARE_PROVIDER_SITE_OTHER): Payer: Medicaid Other | Admitting: Obstetrics and Gynecology

## 2017-01-13 VITALS — BP 127/84 | HR 87 | Ht 65.0 in | Wt 163.5 lb

## 2017-01-13 DIAGNOSIS — E663 Overweight: Secondary | ICD-10-CM | POA: Diagnosis not present

## 2017-01-13 MED ORDER — PHENTERMINE HCL 37.5 MG PO TABS: 37.5000 mg | ORAL_TABLET | Freq: Every day | ORAL | 2 refills | Status: DC

## 2017-01-13 NOTE — Progress Notes (Signed)
Pt is here to discuss weight management.  Pt has been exercising and increasing water intake and cutting out sodas.  We discussed her BP and she is to take it weekly and let us know how it is running.  Advised pt of all the side effects and she voiced understanding

## 2017-01-29 ENCOUNTER — Encounter: Payer: Medicaid Other | Admitting: Obstetrics and Gynecology

## 2017-02-12 ENCOUNTER — Ambulatory Visit (INDEPENDENT_AMBULATORY_CARE_PROVIDER_SITE_OTHER): Payer: Medicaid Other | Admitting: Obstetrics and Gynecology

## 2017-02-12 VITALS — BP 133/104 | HR 80 | Ht 65.0 in | Wt 156.9 lb

## 2017-02-12 DIAGNOSIS — E663 Overweight: Secondary | ICD-10-CM

## 2017-02-12 MED ORDER — CYANOCOBALAMIN 1000 MCG/ML IJ SOLN
1000.0000 ug | Freq: Once | INTRAMUSCULAR | Status: AC
  Administered 2017-02-12: 1000 ug via INTRAMUSCULAR

## 2017-02-12 MED ORDER — CYANOCOBALAMIN 1000 MCG/ML IJ SOLN: 1000.0000 ug | Freq: Once | INTRAMUSCULAR | 3 refills | Status: AC

## 2017-02-12 NOTE — Progress Notes (Signed)
Patient ID: Sonya Bauer, female   DOB: [birth year], 26 y.o.   MRN: [id number] Pt presents for weight, B/P, B-12 injection. No side effects of medication-B-12.  Weight loss of 7 lbs. Encouraged eating healthy and exercise. Pt due to see PCP for elevated B/P today. recomennded not to take phentermine until she sees doctor. She only takes 1/2 tab.  PCP aware per pt and wants to make sure what is causing her elevated B/P.

## 2017-02-16 ENCOUNTER — Other Ambulatory Visit: Payer: Self-pay | Admitting: Internal Medicine

## 2017-02-16 DIAGNOSIS — I10 Essential (primary) hypertension: Secondary | ICD-10-CM

## 2017-02-22 ENCOUNTER — Ambulatory Visit
Admission: RE | Admit: 2017-02-22 | Discharge: 2017-02-22 | Disposition: A | Discharge status: Home / Self Care | Payer: Medicaid Other | Source: Ambulatory Visit | Attending: Internal Medicine | Admitting: Internal Medicine

## 2017-02-22 DIAGNOSIS — I10 Essential (primary) hypertension: Secondary | ICD-10-CM | POA: Insufficient documentation

## 2017-02-22 DIAGNOSIS — K769 Liver disease, unspecified: Secondary | ICD-10-CM | POA: Diagnosis not present

## 2017-02-22 DIAGNOSIS — R072 Precordial pain: Secondary | ICD-10-CM | POA: Insufficient documentation

## 2017-03-15 ENCOUNTER — Ambulatory Visit (INDEPENDENT_AMBULATORY_CARE_PROVIDER_SITE_OTHER): Payer: Medicaid Other | Admitting: Obstetrics and Gynecology

## 2017-03-15 VITALS — BP 120/89 | HR 99 | Ht 65.0 in | Wt 152.2 lb

## 2017-03-15 DIAGNOSIS — E663 Overweight: Secondary | ICD-10-CM | POA: Diagnosis not present

## 2017-03-15 MED ORDER — CYANOCOBALAMIN 1000 MCG/ML IJ SOLN
1000.0000 ug | Freq: Once | INTRAMUSCULAR | Status: AC
  Administered 2017-03-15: 1000 ug via INTRAMUSCULAR

## 2017-03-15 NOTE — Progress Notes (Signed)
Patient ID: Sonya Bauer, female   DOB: December 17, 1990, 26 y.o.   MRN: [id number] Pt presents for weight, B/P, B-12 injection. No side effects of medication-Phentermine, or B-12.  Weight loss of 4 lbs. Encouraged eating healthy and exercise. Pt has seen her PCP for her elevated blood pressures. Increased Norvasc from 5mg  to 10mg  daily and put on another diuretic- [place] 25mg  1/2 tab QAM. B/P-120/89 in office today.

## 2017-04-16 ENCOUNTER — Ambulatory Visit (INDEPENDENT_AMBULATORY_CARE_PROVIDER_SITE_OTHER): Payer: Medicaid Other | Admitting: Obstetrics and Gynecology

## 2017-04-16 ENCOUNTER — Encounter: Payer: Self-pay | Admitting: Obstetrics and Gynecology

## 2017-04-16 VITALS — BP 110/84 | HR 90 | Ht 65.0 in | Wt 145.0 lb

## 2017-04-16 DIAGNOSIS — E663 Overweight: Secondary | ICD-10-CM

## 2017-04-16 DIAGNOSIS — Z79899 Other long term (current) drug therapy: Secondary | ICD-10-CM

## 2017-04-16 MED ORDER — PHENTERMINE HCL 37.5 MG PO TABS: 37.5000 mg | ORAL_TABLET | Freq: Every day | ORAL | 2 refills | Status: DC

## 2017-04-16 MED ORDER — CYANOCOBALAMIN 1000 MCG/ML IJ SOLN: 1000.0000 ug | INTRAMUSCULAR | 1 refills | Status: DC

## 2017-04-16 NOTE — Progress Notes (Signed)
SUBJECTIVE:  26 y.o. here for follow-up weight loss visit, previously seen 4 weeks ago. Denies any concerns and feels like medication has worked well. Has lost 18 pounds in 3 months.  Desires to loose 8-10 more pounds.   OBJECTIVE:  BP 110/84   Pulse 90   Ht 5\' 5"  (1.651 m)   Wt 145 lb (65.8 kg)   LMP 04/02/2017   BMI 24.13 kg/m   Body mass index is 24.13 kg/m. Patient appears well. ASSESSMENT:  Weight managment- responding well to weight loss plan PLAN:  To continue with current medications. B12 1057mcg/ml injection given RTC in 5 weeks as discussed.  Sonya Bauer, CNM

## 2017-05-23 ENCOUNTER — Ambulatory Visit
Admission: EM | Admit: 2017-05-23 | Discharge: 2017-05-23 | Disposition: A | Discharge status: Home / Self Care | Payer: Medicaid Other | Attending: Family Medicine | Admitting: Family Medicine

## 2017-05-23 DIAGNOSIS — I10 Essential (primary) hypertension: Secondary | ICD-10-CM | POA: Insufficient documentation

## 2017-05-23 DIAGNOSIS — K219 Gastro-esophageal reflux disease without esophagitis: Secondary | ICD-10-CM | POA: Insufficient documentation

## 2017-05-23 DIAGNOSIS — F329 Major depressive disorder, single episode, unspecified: Secondary | ICD-10-CM | POA: Insufficient documentation

## 2017-05-23 DIAGNOSIS — J029 Acute pharyngitis, unspecified: Secondary | ICD-10-CM | POA: Insufficient documentation

## 2017-05-23 DIAGNOSIS — J358 Other chronic diseases of tonsils and adenoids: Secondary | ICD-10-CM

## 2017-05-23 DIAGNOSIS — Z79899 Other long term (current) drug therapy: Secondary | ICD-10-CM | POA: Diagnosis not present

## 2017-05-23 LAB — RAPID STREP SCREEN (MED CTR MEBANE ONLY): STREPTOCOCCUS, GROUP A SCREEN (DIRECT): NEGATIVE

## 2017-05-23 NOTE — ED Triage Notes (Signed)
Pt reports "I keep getting tonsil stones". Pt reports she has had this right sided one x past two days. Pain 7/10. Right neck feels swollen

## 2017-05-23 NOTE — Discharge Instructions (Signed)
Gargle often.  See ENT.  Take care  Dr. Lacinda Axon

## 2017-05-23 NOTE — ED Provider Notes (Signed)
[facility]    CSN: [id number] Arrival date & time: 05/23/17  1032  History   Chief Complaint Chief Complaint  Patient presents with  . Sore Throat   HPI  26 year old female presents with complaints of sore throat.  Patient states that she's had a sore throat since yesterday. She states that she feels it's from a large tonsil stone. She states that she gets these often. She's been unable to remove it. No fevers or chills. No known exacerbating or relieving factors. No other associated respiratory symptoms. Severe. No other complaints or concerns at this time.   Past Medical History:  Diagnosis Date  . Complication of anesthesia   . Depression   . Family history of adverse reaction to anesthesia    MOM-HAD SEIZURE DURING SURGERY   . GERD (gastroesophageal reflux disease)    EVERY OTHER DAY  . Headache   . Heart murmur    ASYMPTOMATIC  . Heartburn   . History of kidney stones   . Hypertension   . Pregnancy induced hypertension   . 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 Multinodular goiter 11/16/2014    Past Surgical History:  Procedure Laterality Date  . CYSTOSCOPY WITH STENT PLACEMENT Left 10/06/2016   Procedure: CYSTOSCOPY WITH STENT PLACEMENT;  Surgeon: Alexis Frock, MD;  Location: [facility];  Service: Urology;  Laterality: Left;  . URETEROSCOPY WITH HOLMIUM LASER LITHOTRIPSY Left 10/06/2016   Procedure: URETEROSCOPY WITH HOLMIUM LASER LITHOTRIPSY;  Surgeon: Alexis Frock, MD;  Location: [facility];  Service: Urology;  Laterality: Left;  . WISDOM TOOTH EXTRACTION     AGE 66    OB History    Gravida Para Term Preterm AB Living   1 1 1     1    SAB TAB Ectopic Multiple Live Births         0 1       Home Medications    Prior to Admission medications   Medication Sig Start Date End Date Taking? Authorizing Provider  acyclovir (ZOVIRAX) 200 MG capsule Take 200 mg by mouth as needed.     [provider]  acyclovir (ZOVIRAX) 400 MG tablet TAKE 1 TABLET(400 MG) BY MOUTH TWICE DAILY 12/08/16   Shambley, Melody N, CNM  amLODipine (NORVASC) 10 MG tablet TK 1 T PO QAM 02/01/17   [provider]  cetirizine (ZYRTEC) 10 MG tablet Take by mouth.    [provider]  chlorthalidone (HYGROTON) 25 MG tablet TK 1/2 T PO QAM 02/15/17   [provider]  cyanocobalamin (,VITAMIN B-12,) 1000 MCG/ML injection Inject 1 mL (1,000 mcg total) into the muscle every 30 (thirty) days. 04/16/17   Shambley, Melody N, CNM  hydrochlorothiazide (HYDRODIURIL) 25 MG tablet Take 1 tablet (25 mg total) by mouth daily. Patient not taking: Reported on 10/15/2016 07/30/16   Joylene Igo, CNM  phentermine (ADIPEX-P) 37.5 MG tablet Take 1 tablet (37.5 mg total) by mouth daily before breakfast. 04/16/17   Shambley, Melody N, CNM  ranitidine (ZANTAC) 150 MG tablet Take 1 tablet (150 mg total) by mouth 2 (two) times daily. Patient not taking: Reported on 04/16/2017 11/11/16   Joylene Igo, CNM    Family History Family History  Problem Relation Age of Onset  . Hypertension Father   . Kidney disease Neg Hx   . Bladder Cancer Neg Hx     Social History Social History  Substance Use Topics  . Smoking status: Never Smoker  . Smokeless tobacco: Never Used  . Alcohol use No    Allergies   Azithromycin   Review of Systems Review of Systems  Constitutional: Negative.   HENT: Positive for sore throat.    Physical Exam Triage Vital Signs ED Triage Vitals  Enc Vitals Group     BP 05/23/17 1116 121/87     Pulse Rate 05/23/17 1116 96     Resp 05/23/17 1116 18     Temp 05/23/17 1116 98.2 F (36.8 C)     Temp Source 05/23/17 1116 Oral     SpO2 05/23/17 1116 100 %     Weight 05/23/17 1114 145 lb (65.8 kg)     Height 05/23/17 1114 5\' 5"  (1.651 m)     Head Circumference --      Peak Flow --      Pain Score 05/23/17 1114 7     Pain Loc --      Pain Edu? --      Excl. in  [place]? --    Updated Vital Signs BP 121/87 (BP Location: Right Arm)   Pulse 96   Temp 98.2 F (36.8 C) (Oral)   Resp 18   Ht 5\' 5"  (1.651 m)   Wt 145 lb (65.8 kg)   LMP 04/30/2017   SpO2 100%   BMI 24.13 kg/m   Physical Exam  Constitutional: She is oriented to person, place, and time. She appears well-developed. No distress.  HENT:  Head: Normocephalic and atraumatic.  Nose: Nose normal.  Oropharynx with mild erythema. Large tonsillolith noted (right).  Eyes: Conjunctivae are normal.  Neck: Normal range of motion. Neck supple.  Pulmonary/Chest: Effort normal. No respiratory distress.  Neurological: She is alert and oriented to person, place, and time.  Psychiatric: She has a normal mood and affect.  Vitals reviewed.  UC Treatments / Results  Labs (all labs ordered are listed, but only abnormal results are displayed) Labs Reviewed  RAPID STREP SCREEN (NOT AT [facility])  CULTURE, GROUP A STREP [facility])    EKG  EKG Interpretation None       Radiology No results found.  Procedures Procedures (including critical care time)  Medications Ordered in UC Medications - No data to display   Initial Impression / Assessment and Plan / UC Course  I have reviewed the triage vital signs and the nursing notes.  Pertinent labs & imaging results that were available during my care of the patient were reviewed by me and considered in my medical decision making (see chart for details).   26 year old female presents with sore throat. Rapid strep negative. Pain is secondary to a large tonsillolith. Removed with forceps today. Advised supportive care and frequent salt gargles. Advised to see ENT.   Final Clinical Impressions(s) / UC Diagnoses   Final diagnoses:  Sore throat    New Prescriptions Discharge Medication List as of 05/23/2017 11:53 AM       Controlled Substance Prescriptions [place] Controlled Substance Registry consulted? Not Applicable   Coral Spikes, DO 05/23/17  1246

## 2017-05-24 ENCOUNTER — Ambulatory Visit (INDEPENDENT_AMBULATORY_CARE_PROVIDER_SITE_OTHER): Payer: Medicaid Other | Admitting: Obstetrics and Gynecology

## 2017-05-24 VITALS — BP 114/90 | HR 84 | Ht 65.0 in | Wt 141.3 lb

## 2017-05-24 DIAGNOSIS — Z79899 Other long term (current) drug therapy: Secondary | ICD-10-CM

## 2017-05-24 MED ORDER — CYANOCOBALAMIN 1000 MCG/ML IJ SOLN
1000.0000 ug | Freq: Once | INTRAMUSCULAR | Status: AC
  Administered 2017-05-24: 1000 ug via INTRAMUSCULAR

## 2017-05-24 NOTE — Progress Notes (Signed)
Patient ID: Sonya Bauer, female   DOB: 1991/07/22, 26 y.o.   MRN: [id number] Pt presents for weight, B/P, B-12 injection. No side effects of medication-Phentermine, or B-12.  Weight loss of 4 lbs. Encouraged eating healthy and exercise.

## 2017-05-26 LAB — CULTURE, GROUP A STREP (THRC)

## 2017-06-21 ENCOUNTER — Ambulatory Visit (INDEPENDENT_AMBULATORY_CARE_PROVIDER_SITE_OTHER): Payer: Medicaid Other | Admitting: Obstetrics and Gynecology

## 2017-06-21 VITALS — BP 123/77 | HR 88 | Wt 142.3 lb

## 2017-06-21 DIAGNOSIS — E663 Overweight: Secondary | ICD-10-CM

## 2017-06-21 MED ORDER — CYANOCOBALAMIN 1000 MCG/ML IJ SOLN
1000.0000 ug | Freq: Once | INTRAMUSCULAR | Status: AC
  Administered 2017-06-21: 1000 ug via INTRAMUSCULAR

## 2017-06-21 NOTE — Progress Notes (Signed)
Pt is doing well, she is only taking 1/2 qd, states that works well for her  06/21/17 wt- 142lb 05/24/17 wt- 145lb

## 2017-06-29 IMAGING — US US RENAL
1 series · 14 of 25 positions shown · non-contrast
Comparison: 04/02/2016 abdominal CT

CLINICAL DATA: Gross hematuria.  Flank pain

EXAM:
RENAL / URINARY TRACT ULTRASOUND COMPLETE

[Series 1: us renal · 0.23mm/px · 14 of 46 slices shown]
[im 1/46]
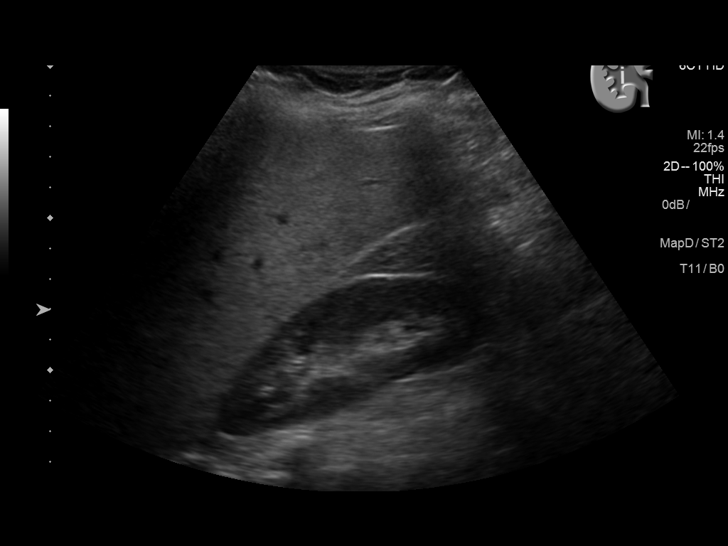
[im 4/46]
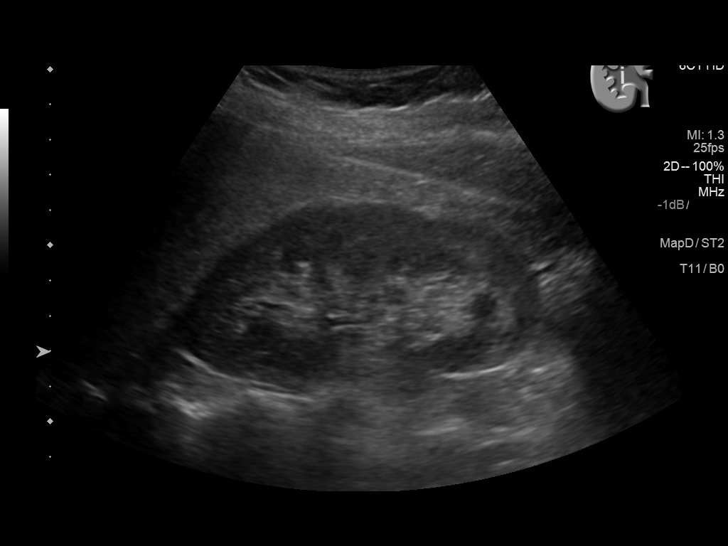
[im 8/46]
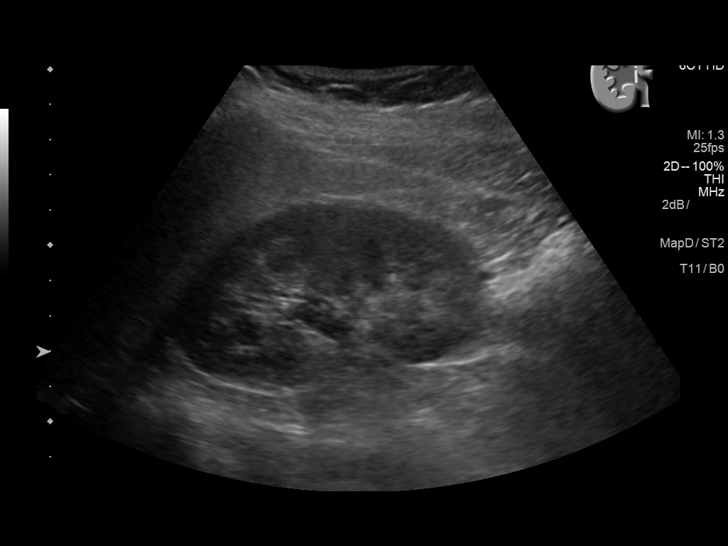
[im 12/46]
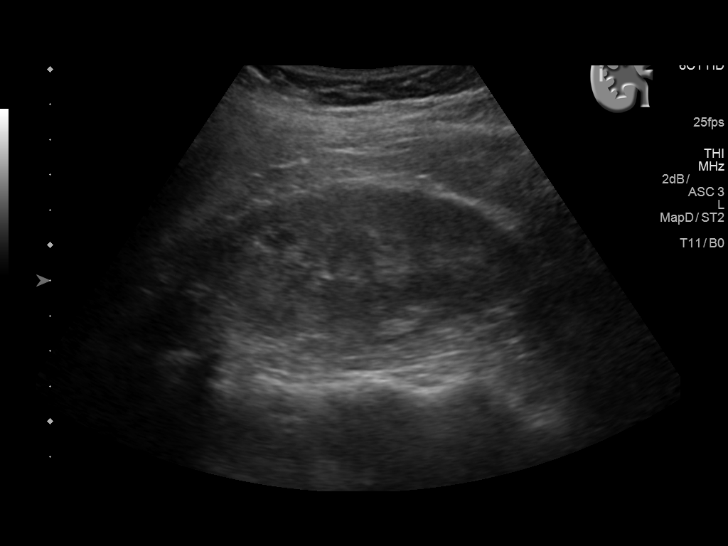
[im 16/46]
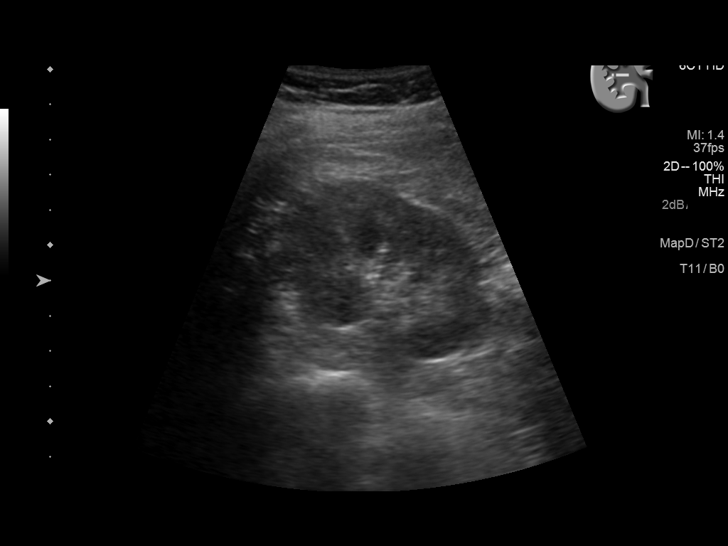
[im 17/46]
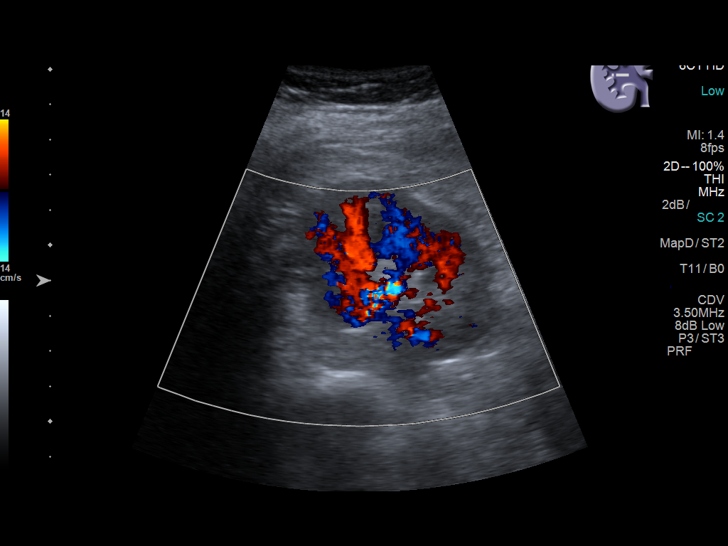
[im 21/46]
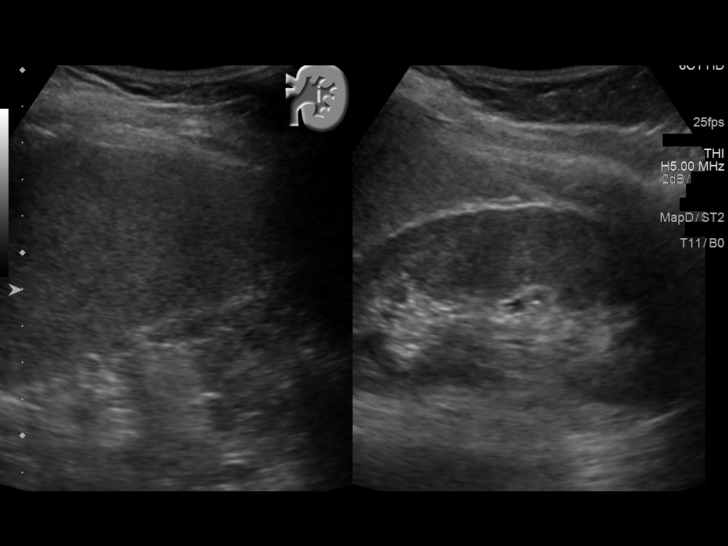
[im 25/46]
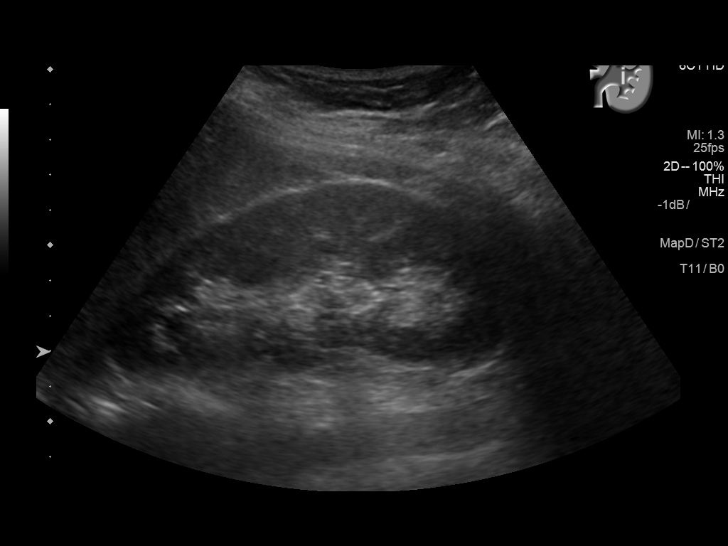
[im 29/46]
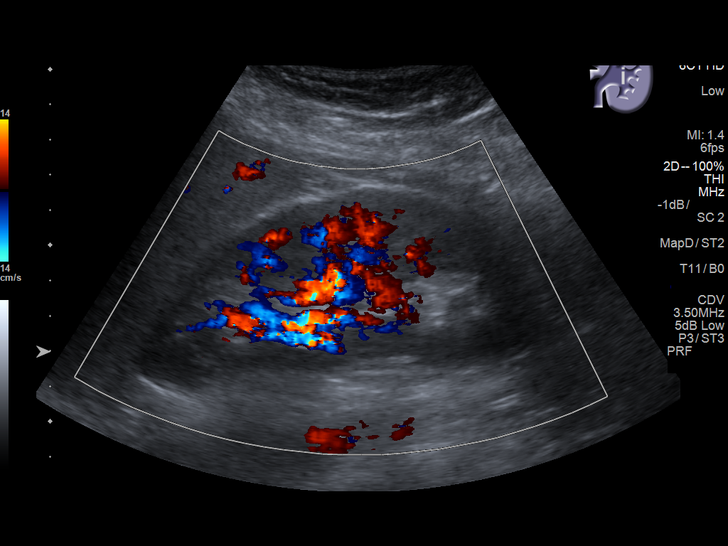
[im 31/46]
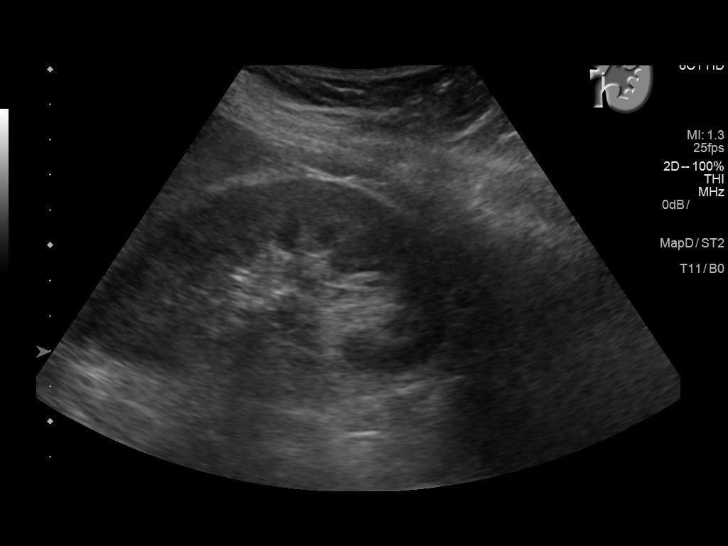
[im 34/46]
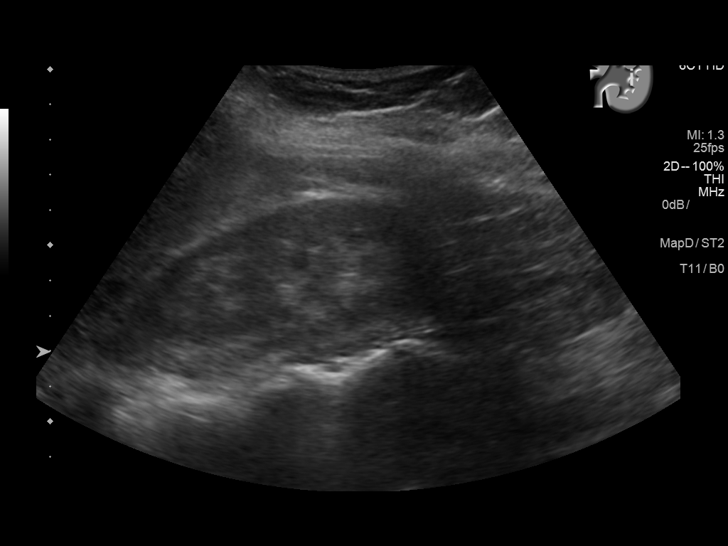
[im 38/46]
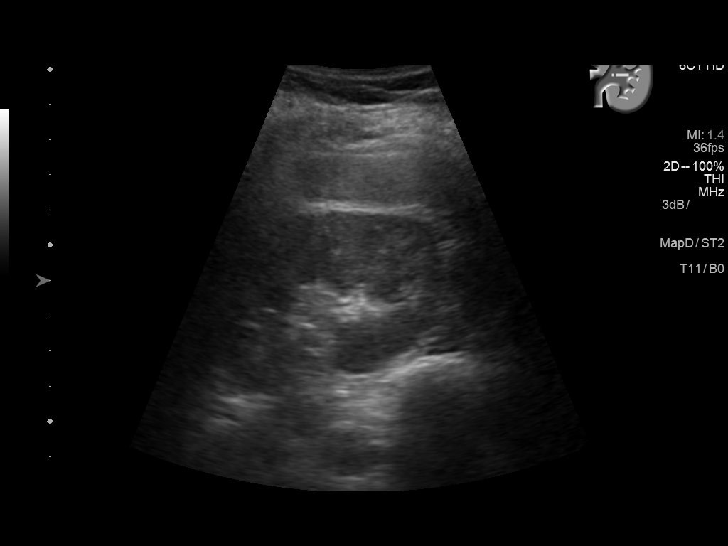
[im 42/46]
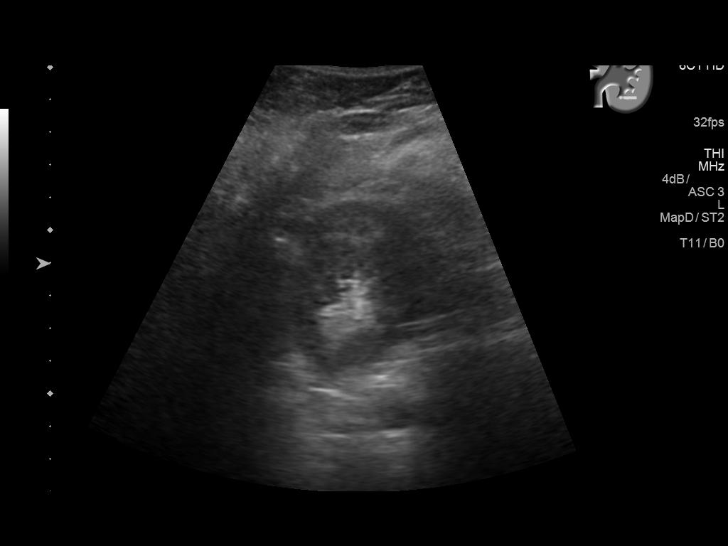
[im 46/46]
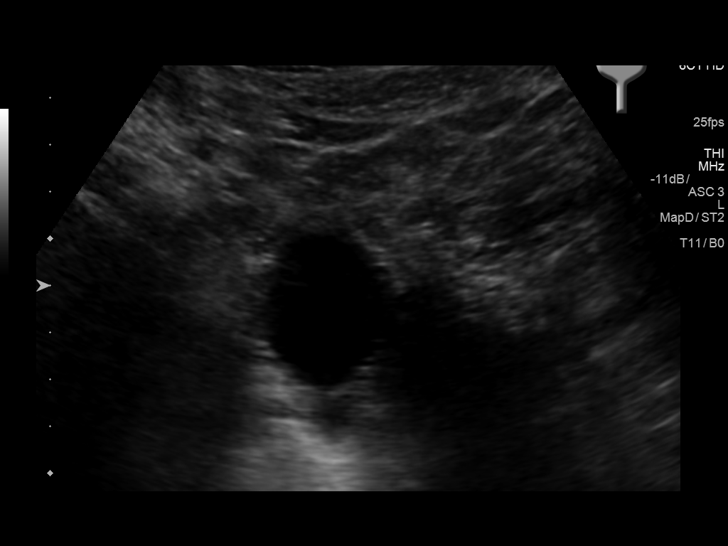

[14 of 25 positions shown; findings below may reference images not displayed]

FINDINGS: Right Kidney:

Length: 11 cm. Echogenicity within normal limits. No mass or
hydronephrosis visualized. No visible calculus.

Left Kidney:

Length: 11.6 cm. Echogenicity within normal limits. No mass or
hydronephrosis visualized.

Bladder:

Minimally distended.  No pathologic finding.

Prominent liver echogenicity which may reflect a degree of
steatosis.
IMPRESSION: Negative.  No hydronephrosis.

## 2017-07-19 ENCOUNTER — Ambulatory Visit: Payer: Medicaid Other | Admitting: Obstetrics and Gynecology

## 2017-08-10 ENCOUNTER — Telehealth: Payer: Self-pay | Admitting: Obstetrics and Gynecology

## 2017-08-10 ENCOUNTER — Other Ambulatory Visit: Payer: Self-pay | Admitting: *Deleted

## 2017-08-10 DIAGNOSIS — N898 Other specified noninflammatory disorders of vagina: Secondary | ICD-10-CM

## 2017-08-10 MED ORDER — TERCONAZOLE 0.4 % VA CREA: 1.0000 | TOPICAL_CREAM | Freq: Every day | VAGINAL | 0 refills | Status: DC

## 2017-08-10 MED ORDER — FLUCONAZOLE 150 MG PO TABS: 150.0000 mg | ORAL_TABLET | Freq: Once | ORAL | 0 refills | Status: AC

## 2017-08-10 NOTE — Telephone Encounter (Signed)
Done-ac 

## 2017-08-10 NOTE — Telephone Encounter (Signed)
Patient has a yeast infection - white chunky discharge and itching - no odor - tried over the counter treatment and now its worse   Could you send in medication - she is leaving for her honeymoon Thursday  Pharmacy - [facility] in [place]

## 2017-08-30 ENCOUNTER — Encounter: Payer: Self-pay | Admitting: Obstetrics and Gynecology

## 2017-08-30 ENCOUNTER — Ambulatory Visit (INDEPENDENT_AMBULATORY_CARE_PROVIDER_SITE_OTHER): Payer: Medicaid Other | Admitting: Obstetrics and Gynecology

## 2017-08-30 VITALS — BP 128/74 | HR 88 | Wt 146.8 lb

## 2017-08-30 DIAGNOSIS — Z3042 Encounter for surveillance of injectable contraceptive: Secondary | ICD-10-CM

## 2017-08-30 MED ORDER — MEDROXYPROGESTERONE ACETATE 150 MG/ML IM SUSP
150.0000 mg | Freq: Once | INTRAMUSCULAR | Status: AC
  Administered 2017-08-30: 150 mg via INTRAMUSCULAR

## 2017-08-30 MED ORDER — MEDROXYPROGESTERONE ACETATE 150 MG/ML IM SUSP: 150.0000 mg | INTRAMUSCULAR | 4 refills | Status: DC

## 2017-08-30 NOTE — Progress Notes (Signed)
Subjective:     Patient ID: Sonya Bauer, female   DOB: 1991-04-30, 26 y.o.   MRN: [id number]  HPI Here to discuss restart Depo. Blood pressure has been stable. LMP 08/26/17. Has not been on any BC except pull out method.   Review of Systems Negative except stated above in HPI.    Objective:   Physical Exam A&Ox4 Well groomed female in no distress Blood pressure 128/74, pulse 88, weight 146 lb 12.8 oz (66.6 kg), last menstrual period 08/26/2017. Body mass index is 24.43 kg/m.  PE not indicated    Assessment:     Contraception restart-Depo    Plan:    declined flu vaccine  First injection of depo given today, will follow up in 3 months for next depo at Annual exam.  Melody Shambley,CNM

## 2017-09-01 ENCOUNTER — Telehealth: Payer: Self-pay | Admitting: Obstetrics and Gynecology

## 2017-09-01 NOTE — Telephone Encounter (Signed)
Patient called and stated that she recently took a plan B and her menstrual is just now coming to an end. And now the patient believes she may be having a yeast infection. The patient would like to know if she need to take the second dose of her yeast infection medication. Please advise.

## 2017-09-01 NOTE — Telephone Encounter (Signed)
Notified pt ok to take 2nd dose

## 2017-09-21 ENCOUNTER — Encounter: Payer: Self-pay | Admitting: Emergency Medicine

## 2017-09-21 ENCOUNTER — Other Ambulatory Visit: Payer: Self-pay

## 2017-09-21 ENCOUNTER — Ambulatory Visit
Admission: EM | Admit: 2017-09-21 | Discharge: 2017-09-21 | Disposition: A | Discharge status: Home / Self Care | Payer: Medicaid Other | Attending: Emergency Medicine | Admitting: Emergency Medicine

## 2017-09-21 DIAGNOSIS — J0140 Acute pansinusitis, unspecified: Secondary | ICD-10-CM

## 2017-09-21 MED ORDER — AMOXICILLIN-POT CLAVULANATE 875-125 MG PO TABS: 1.0000 | ORAL_TABLET | Freq: Two times a day (BID) | ORAL | 0 refills | Status: DC

## 2017-09-21 MED ORDER — IBUPROFEN 600 MG PO TABS: 600.0000 mg | ORAL_TABLET | Freq: Four times a day (QID) | ORAL | 0 refills | Status: DC | PRN

## 2017-09-21 MED ORDER — FLUTICASONE PROPIONATE 50 MCG/ACT NA SUSP: 2.0000 | Freq: Every day | NASAL | 0 refills | Status: DC

## 2017-09-21 NOTE — Discharge Instructions (Signed)
Take the medication as written. Return to the ER if you get worse, have a fever >100.4, or for any concerns. You may take 600 mg of motrin with 1 gram of tylenol up to 3-4 times a day as needed for pain. This is an effective combination for pain.   Use a the NeilMed sinus rinse as often as you want to to reduce nasal congestion. Follow the directions on the box.  Start taking some Mucinex D.  If the Mucinex D elevates your blood pressure, then switch to regular Mucinex.  Go to www.goodrx.com to look up your medications. This will give you a list of where you can find your prescriptions at the most affordable prices. Or you can ask the pharmacist what the cash price is. This is frequently cheaper than going through insurance.

## 2017-09-21 NOTE — ED Triage Notes (Signed)
Patient in today with a 1 month history of sickness. Patient has caught another cold and is having nasal congestion and facial pain and productive cough (green). Patient has tried OTC cold/flu medicine, Mucinex and decongestants. Patient has tried to use a [employer] and can't get water to go through. Patient denies fever.

## 2017-09-21 NOTE — ED Provider Notes (Signed)
HPI  SUBJECTIVE:  Sonya Bauer is a 26 y.o. female who presents with nasal congestion, rhinorrhea, postnasal drip for 1 month.  She states it turned into a sinus infection for which she took 3-1/2-4 days of leftover doxycycline with improvement in her symptoms.  States that it never fully resolved.  States that she got another upper respiratory infection and reports worsening sinus pain and pressure for the past 4 days.  States that her upper teeth hurt.  She reports clear nasal congestion, but states that she is coughing up "green stuff" for the past month.  She reports postnasal drip.  No rhinorrhea.  States that her ears feel full.  No wheezing, chest pain, shortness of breath.  No fevers.  She tried doxycycline, ibuprofen 400 mg, Mucinex cold and flu OTC medicines, Nettie pot.  States that the Page pot is not working at all due to the nasal congestion.  The doxycycline helped.  Symptoms are worse with lying down and bending forward.  She took ibuprofen within 6-8 hours of evaluation.  She has a past medical history of sinusitis, hypertension, nephrolithiasis.  No history of diabetes.  LMP: One month ago.  Denies possibility of being pregnant states that she had a negative pregnancy test last week.  PMD: [facility].   Past Medical History:  Diagnosis Date  . Complication of anesthesia   . Depression   . Family history of adverse reaction to anesthesia    MOM-HAD SEIZURE DURING SURGERY   . GERD (gastroesophageal reflux disease)    EVERY OTHER DAY  . Headache   . Heart murmur    ASYMPTOMATIC  . Heartburn   . History of kidney stones   . Hypertension   . Pregnancy induced hypertension   . 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 CYSTOSCOPY WITH STENT PLACEMENT Left 10/06/2016   Procedure: CYSTOSCOPY WITH STENT PLACEMENT;  Surgeon: Alexis Frock, MD;  Location: [facility];  Service: Urology;  Laterality: Left;  . URETEROSCOPY WITH HOLMIUM LASER  LITHOTRIPSY Left 10/06/2016   Procedure: URETEROSCOPY WITH HOLMIUM LASER LITHOTRIPSY;  Surgeon: Alexis Frock, MD;  Location: [facility];  Service: Urology;  Laterality: Left;  . WISDOM TOOTH EXTRACTION     AGE 65    Family History  Problem Relation Age of Onset  . Hypertension Father   . Asthma Mother   . Kidney disease Neg Hx   . Bladder Cancer Neg Hx     Social History   Tobacco Use  . Smoking status: Never Smoker  . Smokeless tobacco: Never Used  Substance Use Topics  . Alcohol use: No  . Drug use: No    No current facility-administered medications for this encounter.   Current Outpatient Medications:  .  amLODipine (NORVASC) 10 MG tablet, TK 1 T PO QAM, Disp: , Rfl: 0 .  amoxicillin-clavulanate (AUGMENTIN) 875-125 MG tablet, Take 1 tablet by mouth 2 (two) times daily. X 7 days, Disp: 14 tablet, Rfl: 0 .  chlorthalidone (HYGROTON) 25 MG tablet, TK 1/2 T PO QAM, Disp: , Rfl:  .  fluticasone (FLONASE) 50 MCG/ACT nasal spray, Place 2 sprays into both nostrils daily., Disp: 16 g, Rfl: 0 .  ibuprofen (ADVIL,MOTRIN) 600 MG tablet, Take 1 tablet (600 mg total) by mouth every 6 (six) hours as needed., Disp: 30 tablet, Rfl: 0 .  medroxyPROGESTERone (DEPO-PROVERA) 150 MG/ML injection, Inject 1 mL (150 mg total) every 3 (three) months into the muscle., Disp: 1  mL, Rfl: 4  Allergies  Allergen Reactions  . Azithromycin Nausea And Vomiting     ROS  As noted in HPI.   Physical Exam  BP (!) 149/107 (BP Location: Right Arm)   Pulse 78   Temp 98.3 F (36.8 C) (Oral)   Resp 16   Ht 5\' 5"  (1.651 m)   Wt 145 lb (65.8 kg)   LMP 08/26/2017   SpO2 100%   BMI 24.13 kg/m   Constitutional: Well developed, well nourished, no acute distress Eyes:  EOMI, conjunctiva normal bilaterally HENT: Normocephalic, atraumatic,mucus membranes moist. TMs normal bilaterally. + purulent nasal congestion. Swollen red turbinates. +maxillary sinus tenderness, + frontal sinus tenderness. Oropharynx  normal  +  postnasal drip, cobblestoning Respiratory: Normal inspiratory effort, lungs clear bilaterally Cardiovascular: Normal rate GI: nondistended skin: No rash, skin intact Musculoskeletal: no deformities Neurologic: Alert & oriented x 3, no focal neuro deficits Psychiatric: Speech and behavior appropriate   ED Course   Medications - No data to display  No orders of the defined types were placed in this encounter.   No results found for this or any previous visit (from the past 24 hour(s)). No results found.  ED Clinical Impression  Acute non-recurrent pansinusitis   ED Assessment/Plan   Pt with indications for abx given duration of symptoms and double sickening. Will start augmentin  in addition to nasal steriods, mucinex-d Mucinex if the Mucinex D elevates her blood pressure too much, saline nasal irrigation, increase fluids, tylenol/motrin prn pain. Discussed MDM and plan with pt. Pt agrees with plan and will f/u with PMD prn  *This clinic note was created using Dragon dictation software. Therefore, there may be occasional mistakes despite careful proofreading.  ?    Melynda Ripple, MD 09/21/17 [phone]

## 2017-09-26 ENCOUNTER — Encounter: Payer: Self-pay | Admitting: Obstetrics and Gynecology

## 2017-09-27 ENCOUNTER — Encounter: Payer: Self-pay | Admitting: Obstetrics and Gynecology

## 2017-11-12 ENCOUNTER — Encounter: Payer: Medicaid Other | Admitting: Obstetrics and Gynecology

## 2017-11-15 ENCOUNTER — Ambulatory Visit: Payer: Medicaid Other | Admitting: Obstetrics and Gynecology

## 2017-11-26 ENCOUNTER — Encounter: Payer: Self-pay | Admitting: Certified Nurse Midwife

## 2017-11-26 ENCOUNTER — Ambulatory Visit (INDEPENDENT_AMBULATORY_CARE_PROVIDER_SITE_OTHER): Payer: 59 | Admitting: Certified Nurse Midwife

## 2017-11-26 VITALS — BP 134/96 | HR 86 | Ht 65.0 in | Wt 153.6 lb

## 2017-11-26 DIAGNOSIS — Z0000 Encounter for general adult medical examination without abnormal findings: Secondary | ICD-10-CM | POA: Diagnosis not present

## 2017-11-26 DIAGNOSIS — N941 Unspecified dyspareunia: Secondary | ICD-10-CM | POA: Diagnosis not present

## 2017-11-26 NOTE — Progress Notes (Signed)
Pt is having irregular periods after depo. While on depo pt is having burning during sex. In dec pt had fishy odor and discharge and was treated by her pcp for BV. No BV or fishy odor since treatment, is visiting today to make sure BV is cleared and was treated.

## 2017-11-26 NOTE — Progress Notes (Signed)
GYNECOLOGY ANNUAL PREVENTATIVE CARE ENCOUNTER NOTE  Subjective:   Sonya Bauer is a 27 y.o. G72P1001 female here for a routine annual gynecologic exam.  Current complaints: discomfort with intercourse.   Denies abnormal vaginal bleeding, discharge, pelvic pain, problems with intercourse or other gynecologic concerns.    Gynecologic History No LMP recorded. Patient has had an injection. Contraception: condoms Last Pap: 11/11/16. Results were: normal Last mammogram: N/A.   Obstetric History OB History  Gravida Para Term Preterm AB Living  1 1 1     1   SAB TAB Ectopic Multiple Live Births        0 1    # Outcome Date GA Lbr Len/2nd Weight Sex Delivery Anes PTL Lv  1 Term 07/02/15 [redacted]w[redacted]d 05:50 / 00:14 6 lb 6.7 oz (2.91 kg) F Vag-Spont EPI  LIV      Past Medical History:  Diagnosis Date  . Complication of anesthesia   . Depression   . Family history of adverse reaction to anesthesia    MOM-HAD SEIZURE DURING SURGERY   . GERD (gastroesophageal reflux disease)    EVERY OTHER DAY  . Headache   . Heart murmur    ASYMPTOMATIC  . Heartburn   . History of kidney stones   . Hypertension   . Pregnancy induced hypertension   . 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 CYSTOSCOPY WITH STENT PLACEMENT Left 10/06/2016   Procedure: CYSTOSCOPY WITH STENT PLACEMENT;  Surgeon: Alexis Frock, MD;  Location: [facility];  Service: Urology;  Laterality: Left;  . URETEROSCOPY WITH HOLMIUM LASER LITHOTRIPSY Left 10/06/2016   Procedure: URETEROSCOPY WITH HOLMIUM LASER LITHOTRIPSY;  Surgeon: Alexis Frock, MD;  Location: [facility];  Service: Urology;  Laterality: Left;  . WISDOM TOOTH EXTRACTION     AGE 22    Current Outpatient Medications on File Prior to Visit  Medication Sig Dispense Refill  . ibuprofen (ADVIL,MOTRIN) 600 MG tablet Take 1 tablet (600 mg total) by mouth every 6 (six) hours as needed. 30 tablet 0  . loratadine-pseudoephedrine (CLARITIN-D  24-HOUR) 10-240 MG 24 hr tablet Take 1 tablet by mouth daily.    . medroxyPROGESTERone (DEPO-PROVERA) 150 MG/ML injection Inject 1 mL (150 mg total) every 3 (three) months into the muscle. 1 mL 4   No current facility-administered medications on file prior to visit.     Allergies  Allergen Reactions  . Azithromycin Nausea And Vomiting    Social History   Socioeconomic History  . Marital status: Single    Spouse name: Not on file  . Number of children: Not on file  . Years of education: Not on file  . Highest education level: Not on file  Social Needs  . Financial resource strain: Not on file  . Food insecurity - worry: Not on file  . Food insecurity - inability: Not on file  . Transportation needs - medical: Not on file  . Transportation needs - non-medical: Not on file  Occupational History  . Not on file  Tobacco Use  . Smoking status: Never Smoker  . Smokeless tobacco: Never Used  Substance and Sexual Activity  . Alcohol use: No  . Drug use: No  . Sexual activity: Yes    Birth control/protection: Condom, IUD  Other Topics Concern  . Not on file  Social History Narrative  . Not on file    Family History  Problem Relation Age of Onset  . Hypertension Father   .  Asthma Mother   . Kidney disease Neg Hx   . Bladder Cancer Neg Hx     The following portions of the patient's history were reviewed and updated as appropriate: allergies, current medications, past family history, past medical history, past social history, past surgical history and problem list.  Review of Systems Pertinent items noted in HPI and remainder of comprehensive ROS otherwise negative.   Objective:  BP (!) 134/96   Pulse 86   Ht 5\' 5"  (1.651 m)   Wt 153 lb 9.6 oz (69.7 kg)   BMI 25.56 kg/m  CONSTITUTIONAL: Well-developed, well-nourished female in no acute distress.  HENT:  Normocephalic, atraumatic, External right and left ear normal. Oropharynx is clear and moist EYES: Conjunctivae and  EOM are normal. Pupils are equal, round, and reactive to light. No scleral icterus.  NECK: Normal range of motion, supple, no masses.  Normal thyroid.  SKIN: Skin is warm and dry. No rash noted. Not diaphoretic. No erythema. No pallor. NEUROLOGIC: Alert and oriented to person, place, and time. Normal reflexes, muscle tone coordination. No cranial nerve deficit noted. PSYCHIATRIC: Normal mood and affect. Normal behavior. Normal judgment and thought content. CARDIOVASCULAR: Normal heart rate noted, regular rhythm RESPIRATORY: Clear to auscultation bilaterally. Effort and breath sounds normal, no problems with respiration noted. BREASTS: Symmetric in size. No masses, skin changes, nipple drainage, or lymphadenopathy. ABDOMEN: Soft, normal bowel sounds, no distention noted.  No tenderness, rebound or guarding.  PELVIC: Normal appearing external genitalia; normal appearing vaginal mucosa and cervix.  Small amount of white particulate discharge. No redness no cervical motion tenderness.   Pap smear not indciated  Normal uterine size, no other palpable masses, no uterine or adnexal tenderness. MUSCULOSKELETAL: Normal range of motion. No tenderness.  No cyanosis, clubbing, or edema.  2+ distal pulses.   Assessment and Plan:  There are no diagnoses linked to this encounter. Discussed need to follow up with care provider for BP.  Nuswab for tenderness/painful intercourse She states that she does not use lubrication, encouraged her to use.    Routine preventative health maintenance measures emphasized. Please refer to After Visit Summary for other counseling recommendations. Will follow up with results.   Philip Aspen, CNM

## 2017-11-26 NOTE — Patient Instructions (Signed)
Preventive Care 18-39 Years, Female Preventive care refers to lifestyle choices and visits with your health care provider that can promote health and wellness. What does preventive care include?  A yearly physical exam. This is also called an annual well check.  Dental exams once or twice a year.  Routine eye exams. Ask your health care provider how often you should have your eyes checked.  Personal lifestyle choices, including: ? Daily care of your teeth and gums. ? Regular physical activity. ? Eating a healthy diet. ? Avoiding tobacco and drug use. ? Limiting alcohol use. ? Practicing safe sex. ? Taking vitamin and mineral supplements as recommended by your health care provider. What happens during an annual well check? The services and screenings done by your health care provider during your annual well check will depend on your age, overall health, lifestyle risk factors, and family history of disease. Counseling Your health care provider may ask you questions about your:  Alcohol use.  Tobacco use.  Drug use.  Emotional well-being.  Home and relationship well-being.  Sexual activity.  Eating habits.  Work and work Statistician.  Method of birth control.  Menstrual cycle.  Pregnancy history.  Screening You may have the following tests or measurements:  Height, weight, and BMI.  Diabetes screening. This is done by checking your blood sugar (glucose) after you have not eaten for a while (fasting).  Blood pressure.  Lipid and cholesterol levels. These may be checked every 5 years starting at age 66.  Skin check.  Hepatitis C blood test.  Hepatitis B blood test.  Sexually transmitted disease (STD) testing.  BRCA-related cancer screening. This may be done if you have a family history of breast, ovarian, tubal, or peritoneal cancers.  Pelvic exam and Pap test. This may be done every 3 years starting at age 40. Starting at age 59, this may be done every 5  years if you have a Pap test in combination with an HPV test.  Discuss your test results, treatment options, and if necessary, the need for more tests with your health care provider. Vaccines Your health care provider may recommend certain vaccines, such as:  Influenza vaccine. This is recommended every year.  Tetanus, diphtheria, and acellular pertussis (Tdap, Td) vaccine. You may need a Td booster every 10 years.  Varicella vaccine. You may need this if you have not been vaccinated.  HPV vaccine. If you are 69 or younger, you may need three doses over 6 months.  Measles, mumps, and rubella (MMR) vaccine. You may need at least one dose of MMR. You may also need a second dose.  Pneumococcal 13-valent conjugate (PCV13) vaccine. You may need this if you have certain conditions and were not previously vaccinated.  Pneumococcal polysaccharide (PPSV23) vaccine. You may need one or two doses if you smoke cigarettes or if you have certain conditions.  Meningococcal vaccine. One dose is recommended if you are age 27-21 years and a first-year college student living in a residence hall, or if you have one of several medical conditions. You may also need additional booster doses.  Hepatitis A vaccine. You may need this if you have certain conditions or if you travel or work in places where you may be exposed to hepatitis A.  Hepatitis B vaccine. You may need this if you have certain conditions or if you travel or work in places where you may be exposed to hepatitis B.  Haemophilus influenzae type b (Hib) vaccine. You may need this if  you have certain risk factors.  Talk to your health care provider about which screenings and vaccines you need and how often you need them. This information is not intended to replace advice given to you by your health care provider. Make sure you discuss any questions you have with your health care provider. Document Released: 11/24/2001 Document Revised: 06/17/2016  Document Reviewed: 07/30/2015 Elsevier Interactive Patient Education  2018 [employer]. Hypertension Hypertension, commonly called high blood pressure, is when the force of blood pumping through the arteries is too strong. The arteries are the blood vessels that carry blood from the heart throughout the body. Hypertension forces the heart to work harder to pump blood and may cause arteries to become narrow or stiff. Having untreated or uncontrolled hypertension can cause heart attacks, strokes, kidney disease, and other problems. A blood pressure reading consists of a higher number over a lower number. Ideally, your blood pressure should be below 120/80. The first ("top") number is called the systolic pressure. It is a measure of the pressure in your arteries as your heart beats. The second ("bottom") number is called the diastolic pressure. It is a measure of the pressure in your arteries as the heart relaxes. What are the causes? The cause of this condition is not known. What increases the risk? Some risk factors for high blood pressure are under your control. Others are not. Factors you can change  Smoking.  Having type 2 diabetes mellitus, high cholesterol, or both.  Not getting enough exercise or physical activity.  Being overweight.  Having too much fat, sugar, calories, or salt (sodium) in your diet.  Drinking too much alcohol. Factors that are difficult or impossible to change  Having chronic kidney disease.  Having a family history of high blood pressure.  Age. Risk increases with age.  Race. You may be at higher risk if you are African-American.  Gender. Men are at higher risk than women before age 56. After age 26, women are at higher risk than men.  Having obstructive sleep apnea.  Stress. What are the signs or symptoms? Extremely high blood pressure (hypertensive crisis) may cause:  Headache.  Anxiety.  Shortness of breath.  Nosebleed.  Nausea and  vomiting.  Severe chest pain.  Jerky movements you cannot control (seizures).  How is this diagnosed? This condition is diagnosed by measuring your blood pressure while you are seated, with your arm resting on a surface. The cuff of the blood pressure monitor will be placed directly against the skin of your upper arm at the level of your heart. It should be measured at least twice using the same arm. Certain conditions can cause a difference in blood pressure between your right and left arms. Certain factors can cause blood pressure readings to be lower or higher than normal (elevated) for a short period of time:  When your blood pressure is higher when you are in a health care provider's office than when you are at home, this is called white coat hypertension. Most people with this condition do not need medicines.  When your blood pressure is higher at home than when you are in a health care provider's office, this is called masked hypertension. Most people with this condition may need medicines to control blood pressure.  If you have a high blood pressure reading during one visit or you have normal blood pressure with other risk factors:  You may be asked to return on a different day to have your blood pressure checked  again.  You may be asked to monitor your blood pressure at home for 1 week or longer.  If you are diagnosed with hypertension, you may have other blood or imaging tests to help your health care provider understand your overall risk for other conditions. How is this treated? This condition is treated by making healthy lifestyle changes, such as eating healthy foods, exercising more, and reducing your alcohol intake. Your health care provider may prescribe medicine if lifestyle changes are not enough to get your blood pressure under control, and if:  Your systolic blood pressure is above 130.  Your diastolic blood pressure is above 80.  Your personal target blood pressure  may vary depending on your medical conditions, your age, and other factors. Follow these instructions at home: Eating and drinking  Eat a diet that is high in fiber and potassium, and low in sodium, added sugar, and fat. An example eating plan is called the DASH (Dietary Approaches to Stop Hypertension) diet. To eat this way: ? Eat plenty of fresh fruits and vegetables. Try to fill half of your plate at each meal with fruits and vegetables. ? Eat whole grains, such as whole wheat pasta, brown rice, or whole grain bread. Fill about one quarter of your plate with whole grains. ? Eat or drink low-fat dairy products, such as skim milk or low-fat yogurt. ? Avoid fatty cuts of meat, processed or cured meats, and poultry with skin. Fill about one quarter of your plate with lean proteins, such as fish, chicken without skin, beans, eggs, and tofu. ? Avoid premade and processed foods. These tend to be higher in sodium, added sugar, and fat.  Reduce your daily sodium intake. Most people with hypertension should eat less than 1,500 mg of sodium a day.  Limit alcohol intake to no more than 1 drink a day for nonpregnant women and 2 drinks a day for men. One drink equals 12 oz of beer, 5 oz of wine, or 1 oz of hard liquor. Lifestyle  Work with your health care provider to maintain a healthy body weight or to lose weight. Ask what an ideal weight is for you.  Get at least 30 minutes of exercise that causes your heart to beat faster (aerobic exercise) most days of the week. Activities may include walking, swimming, or biking.  Include exercise to strengthen your muscles (resistance exercise), such as pilates or lifting weights, as part of your weekly exercise routine. Try to do these types of exercises for 30 minutes at least 3 days a week.  Do not use any products that contain nicotine or tobacco, such as cigarettes and e-cigarettes. If you need help quitting, ask your health care provider.  Monitor your  blood pressure at home as told by your health care provider.  Keep all follow-up visits as told by your health care provider. This is important. Medicines  Take over-the-counter and prescription medicines only as told by your health care provider. Follow directions carefully. Blood pressure medicines must be taken as prescribed.  Do not skip doses of blood pressure medicine. Doing this puts you at risk for problems and can make the medicine less effective.  Ask your health care provider about side effects or reactions to medicines that you should watch for. Contact a health care provider if:  You think you are having a reaction to a medicine you are taking.  You have headaches that keep coming back (recurring).  You feel dizzy.  You have swelling in your  ankles.  You have trouble with your vision. Get help right away if:  You develop a severe headache or confusion.  You have unusual weakness or numbness.  You feel faint.  You have severe pain in your chest or abdomen.  You vomit repeatedly.  You have trouble breathing. Summary  Hypertension is when the force of blood pumping through your arteries is too strong. If this condition is not controlled, it may put you at risk for serious complications.  Your personal target blood pressure may vary depending on your medical conditions, your age, and other factors. For most people, a normal blood pressure is less than 120/80.  Hypertension is treated with lifestyle changes, medicines, or a combination of both. Lifestyle changes include weight loss, eating a healthy, low-sodium diet, exercising more, and limiting alcohol. This information is not intended to replace advice given to you by your health care provider. Make sure you discuss any questions you have with your health care provider. Document Released: 09/28/2005 Document Revised: 08/26/2016 Document Reviewed: 08/26/2016 Elsevier Interactive Patient Education  [employer].

## 2017-11-29 ENCOUNTER — Encounter: Payer: Self-pay | Admitting: Certified Nurse Midwife

## 2017-11-29 LAB — NUSWAB VAGINITIS PLUS (VG+)
CHLAMYDIA TRACHOMATIS, NAA: NEGATIVE
Candida albicans, NAA: NEGATIVE
Candida glabrata, NAA: NEGATIVE
NEISSERIA GONORRHOEAE, NAA: NEGATIVE
TRICH VAG BY NAA: NEGATIVE

## 2017-12-09 IMAGING — US US RENAL
1 series · 14 of 25 positions shown · non-contrast
Comparison: Renal ultrasound performed 10/10/2016, and CT of the
abdomen and pelvis from 06/01/2016

CLINICAL DATA: Assess for nephrolithiasis. Recent removal of left
ureteral stent. Initial encounter.

EXAM:
RENAL / URINARY TRACT ULTRASOUND COMPLETE

[Series 1: us renal · 0.26mm/px · 14 of 33 slices shown]
[im 1/33]
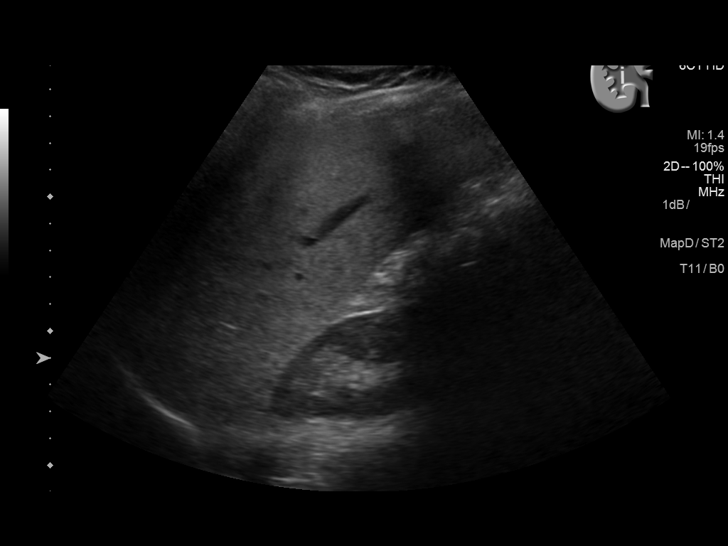
[im 3/33]
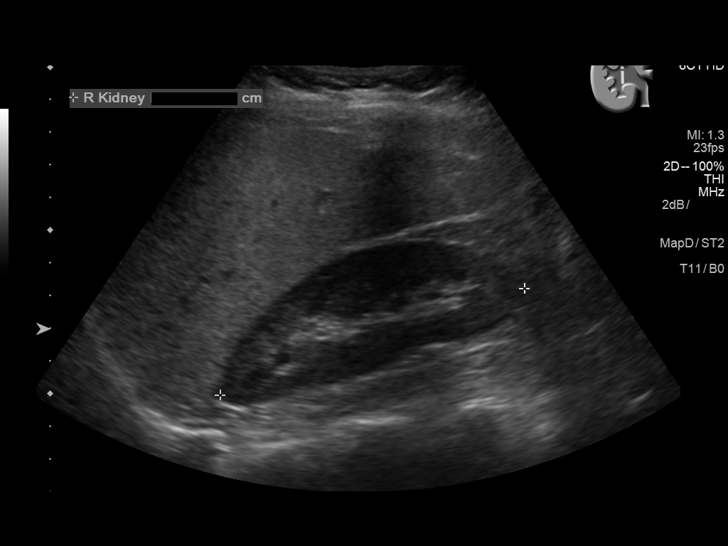
[im 6/33]
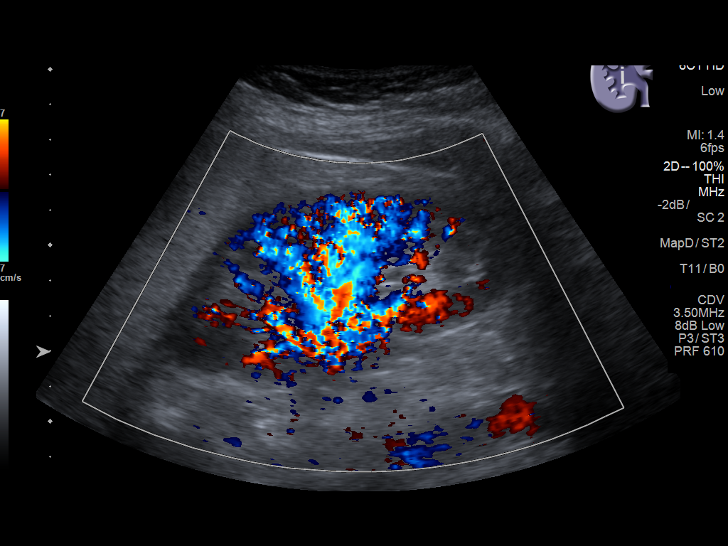
[im 9/33]
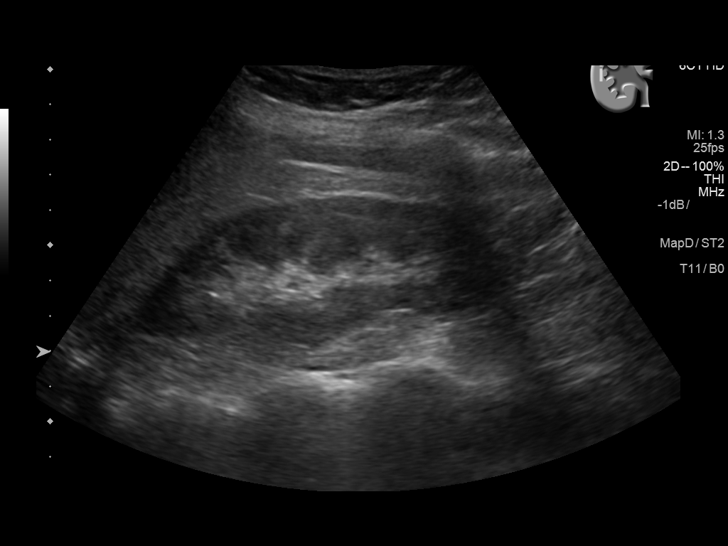
[im 11/33]
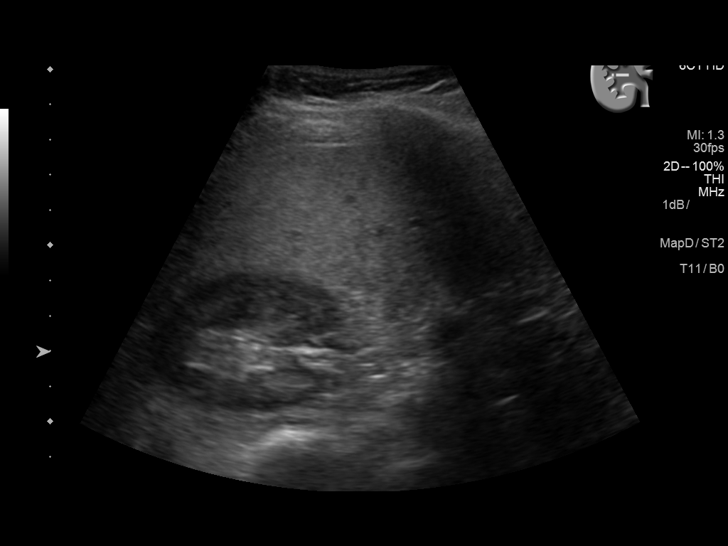
[im 13/33]
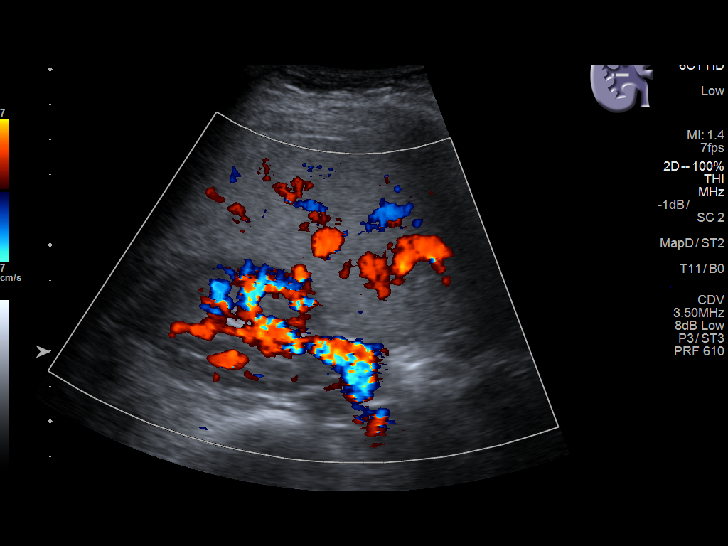
[im 15/33]
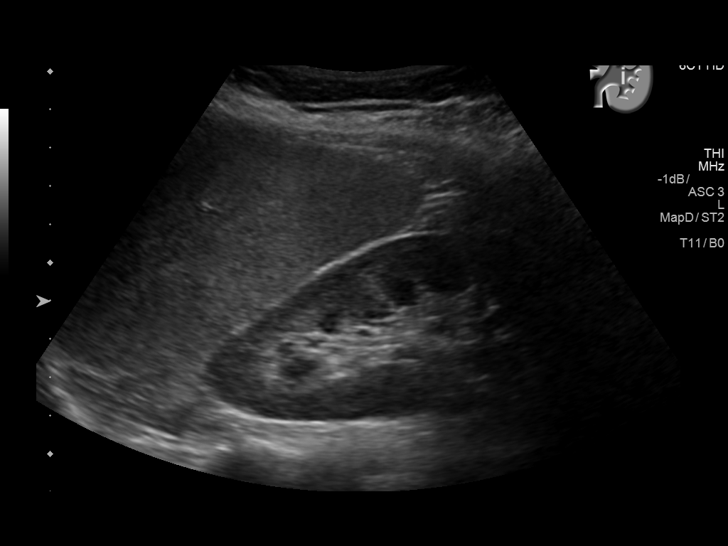
[im 18/33]
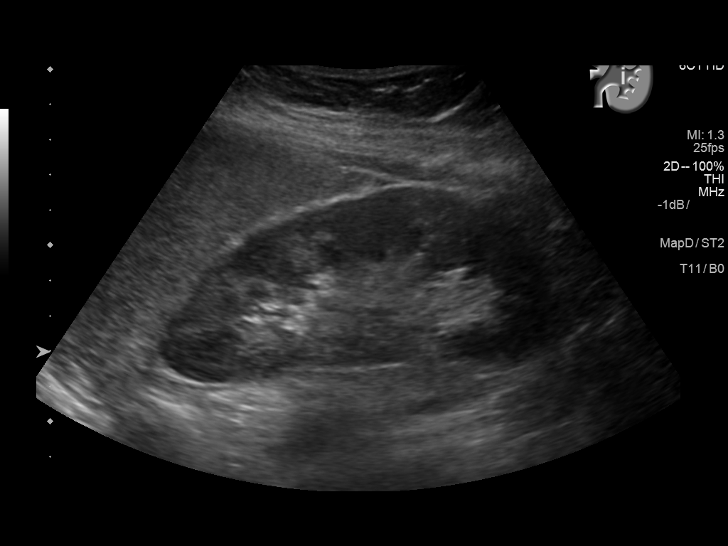
[im 21/33]
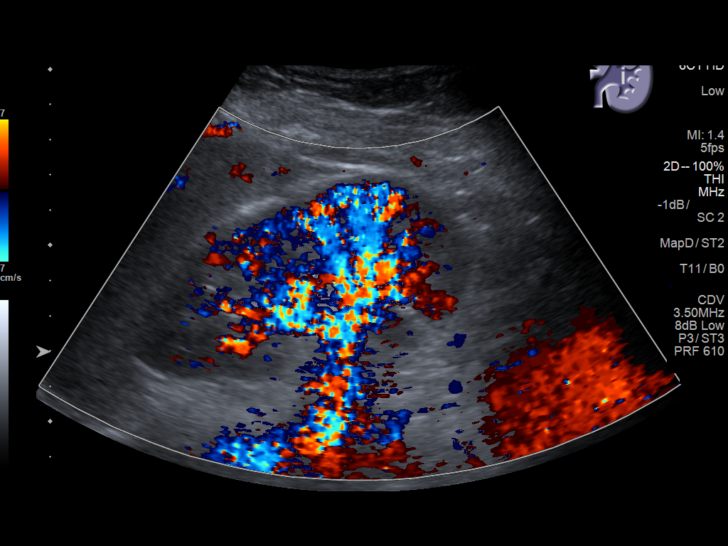
[im 22/33]
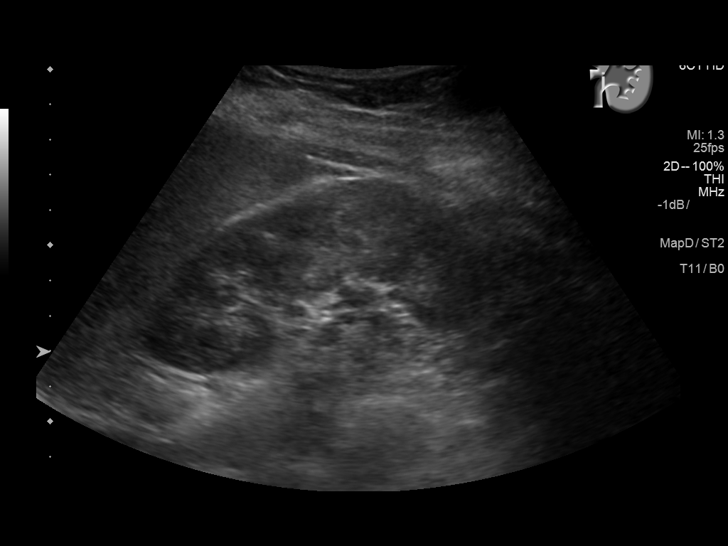
[im 25/33]
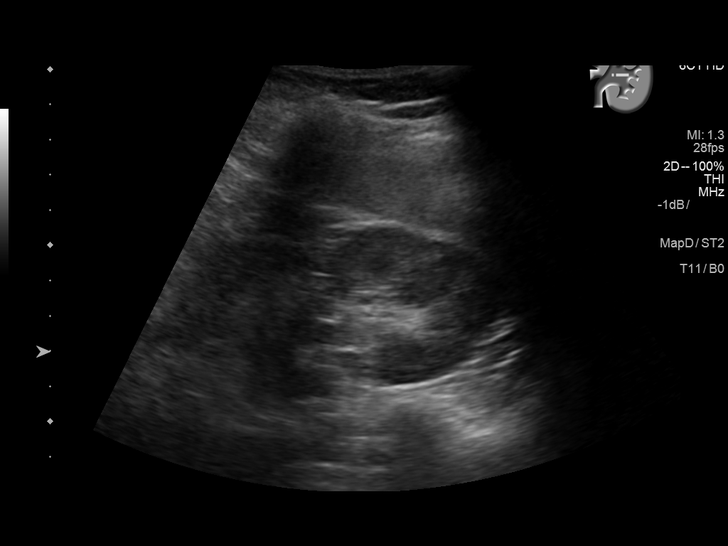
[im 27/33]
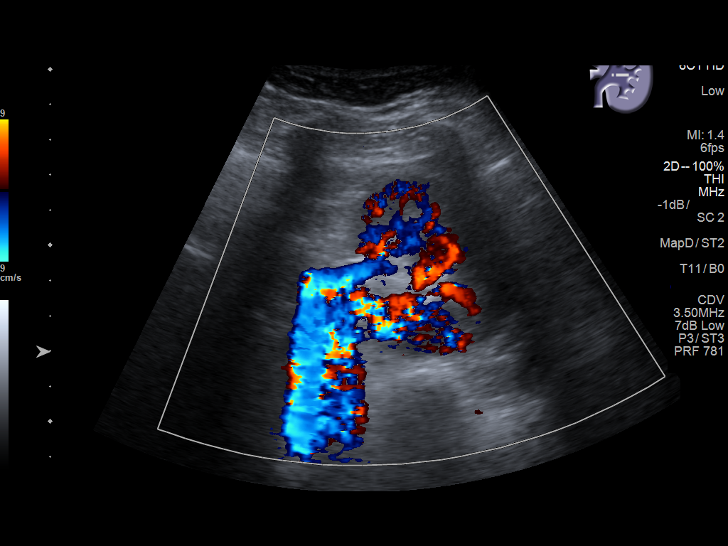
[im 30/33]
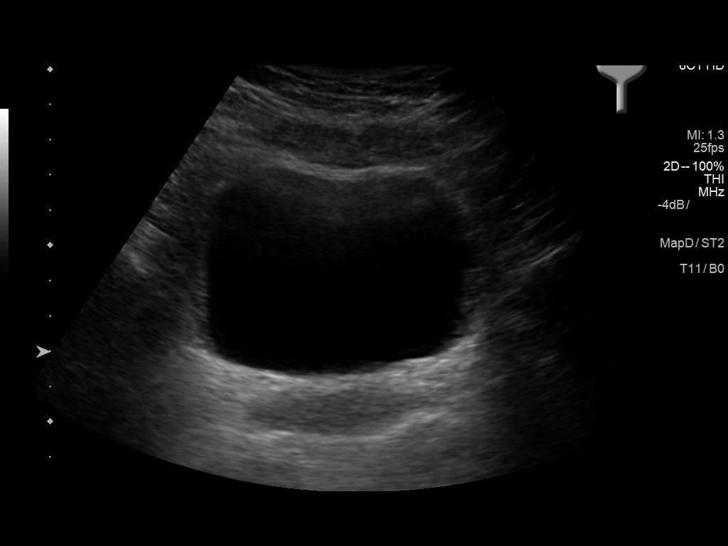
[im 33/33]
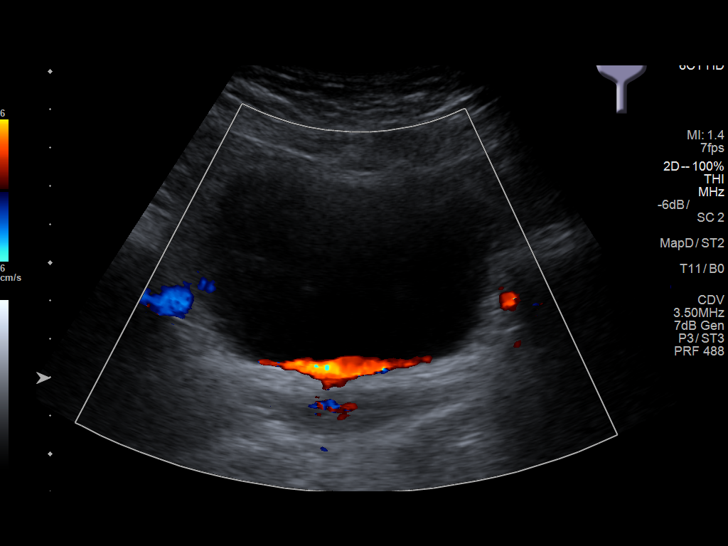

[14 of 25 positions shown; findings below may reference images not displayed]

FINDINGS: Right Kidney:

Length: 10.4 cm. Echogenicity within normal limits. No mass or
hydronephrosis visualized.

Left Kidney:

Length: 11.5 cm. Echogenicity within normal limits. No mass or
hydronephrosis visualized.

Bladder:

Appears normal for degree of bladder distention. Bilateral ureteral
jets are visualized.

Mild diffuse fatty inflation is noted within the liver.
IMPRESSION: 1. No evidence of hydronephrosis. No renal stones seen on
ultrasound.
2. Mild diffuse fatty infiltration within the liver.

## 2017-12-16 DIAGNOSIS — M5412 Radiculopathy, cervical region: Secondary | ICD-10-CM | POA: Insufficient documentation

## 2018-01-07 ENCOUNTER — Other Ambulatory Visit: Payer: Self-pay | Admitting: Neurology

## 2018-01-07 DIAGNOSIS — G44209 Tension-type headache, unspecified, not intractable: Secondary | ICD-10-CM

## 2018-01-07 DIAGNOSIS — R519 Headache, unspecified: Secondary | ICD-10-CM

## 2018-01-07 DIAGNOSIS — R51 Headache: Principal | ICD-10-CM

## 2018-01-13 ENCOUNTER — Ambulatory Visit
Admission: RE | Admit: 2018-01-13 | Discharge: 2018-01-13 | Disposition: A | Discharge status: Home / Self Care | Payer: 59 | Source: Ambulatory Visit | Attending: Neurology | Admitting: Neurology

## 2018-01-13 DIAGNOSIS — G44209 Tension-type headache, unspecified, not intractable: Secondary | ICD-10-CM | POA: Diagnosis not present

## 2018-02-15 ENCOUNTER — Encounter: Payer: Self-pay | Admitting: Obstetrics and Gynecology

## 2018-03-23 IMAGING — US US RENAL ARTERY STENOSIS
1 series · 13 of 25 positions shown · non-contrast
Comparison: CT abdomen pelvis- 06/01/2016 ; renal ultrasound -
11/10/2016

CLINICAL DATA: Resistant hypertension. Evaluate for renal artery
stenosis.

EXAM:
RENAL/URINARY TRACT ULTRASOUND
RENAL DUPLEX DOPPLER ULTRASOUND

[Series 1: us renal artery stenosis · 0.26mm/px · 13 of 67 slices shown]
[im 1/67]
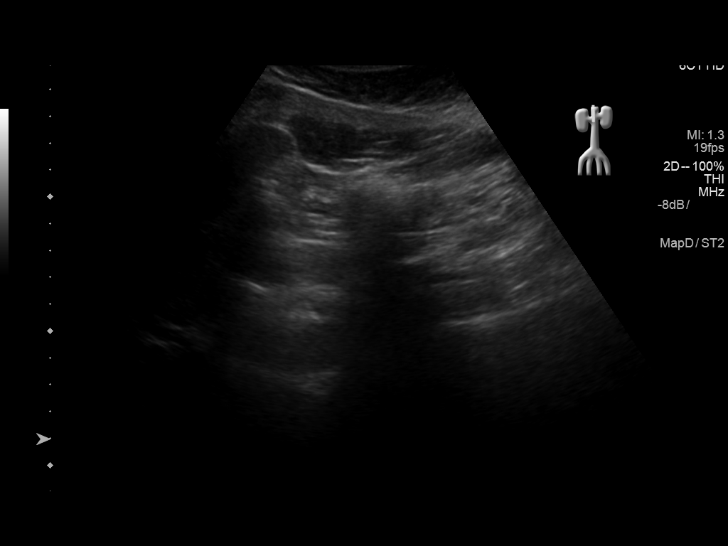
[im 6/67]
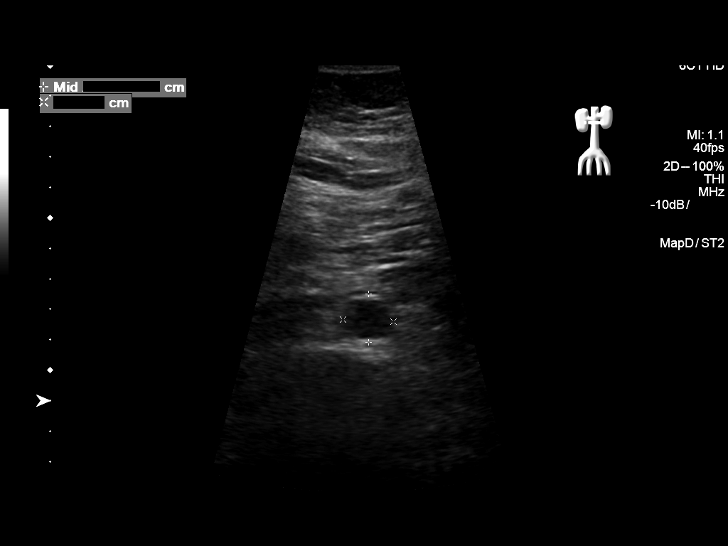
[im 12/67]
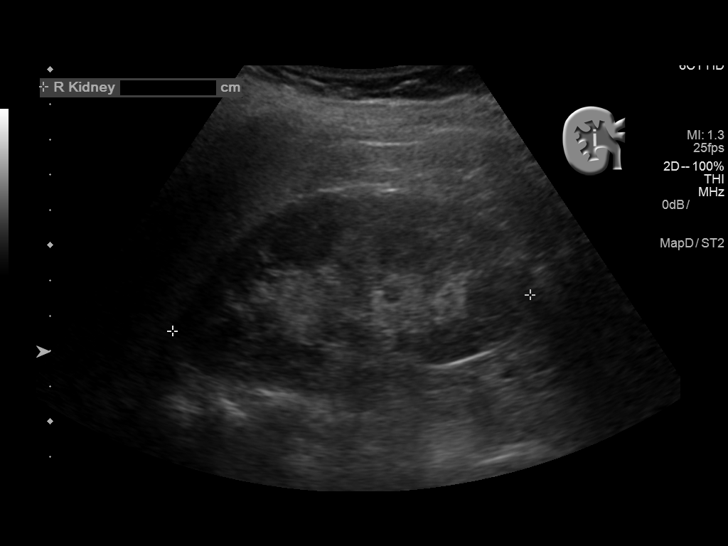
[im 17/67]
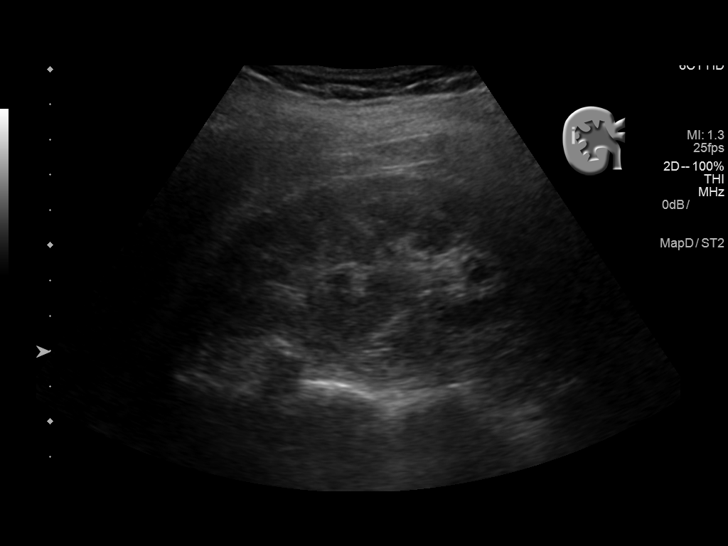
[im 23/67]
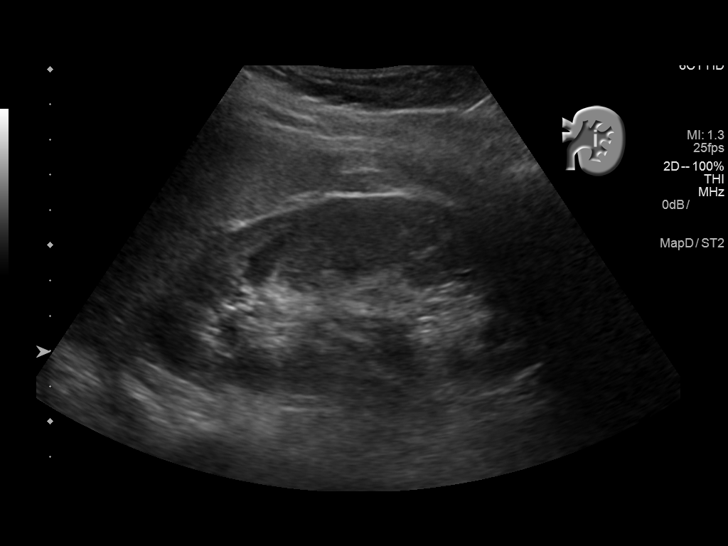
[im 28/67]
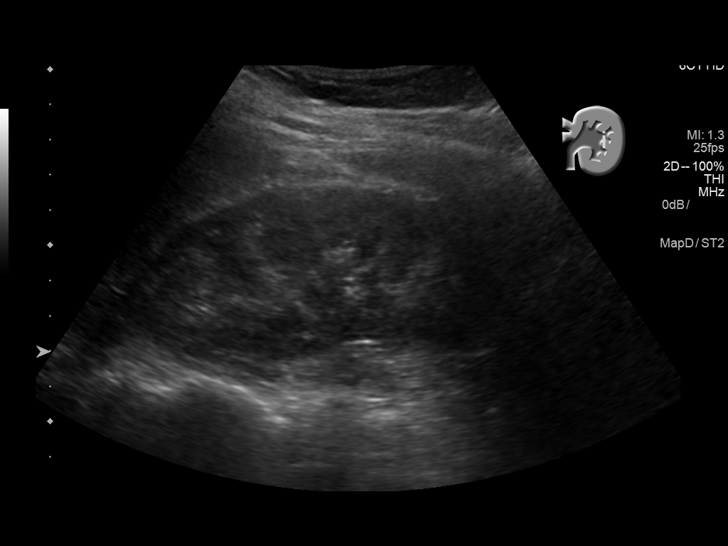
[im 34/67]
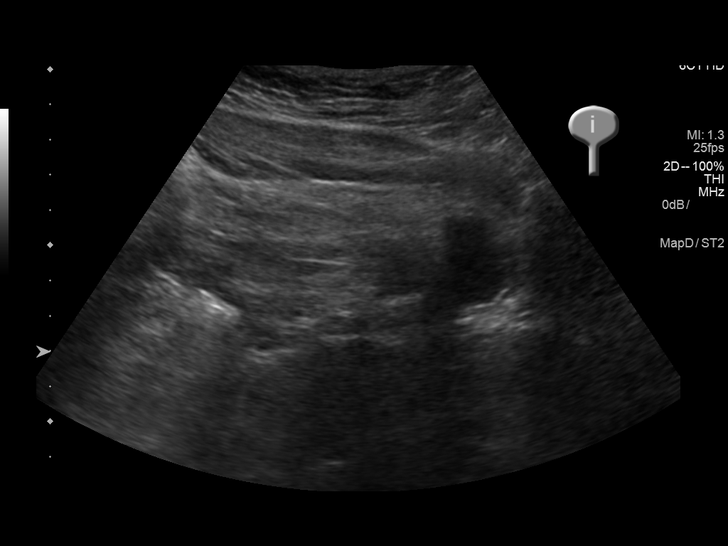
[im 39/67]
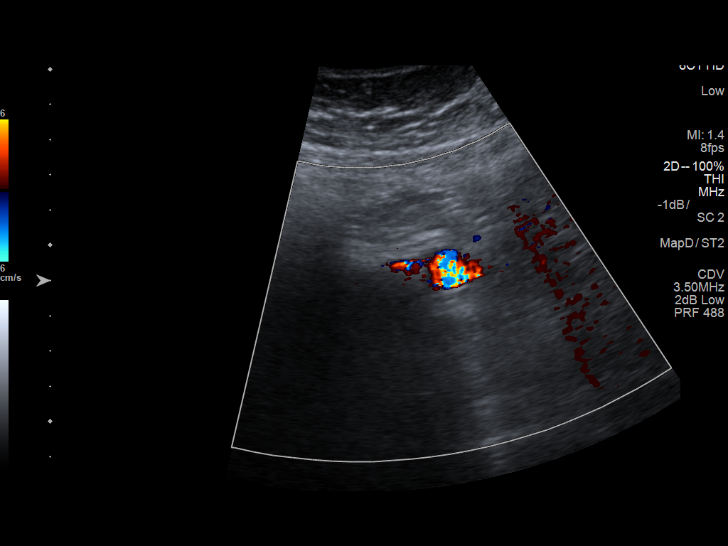
[im 45/67]
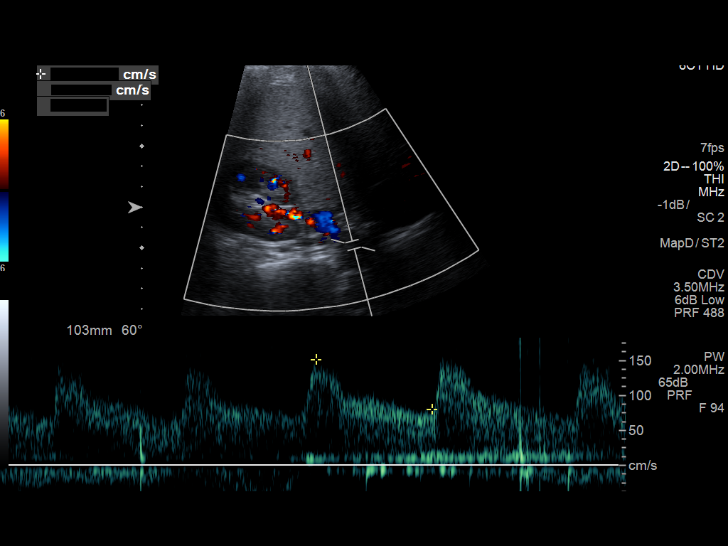
[im 50/67]
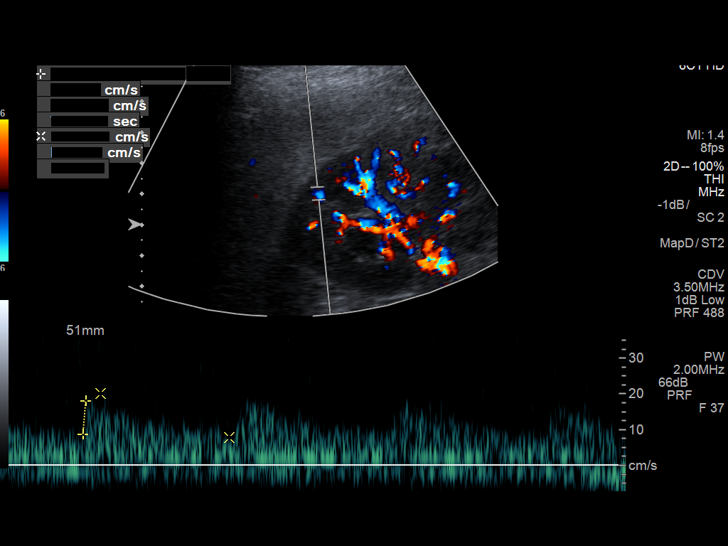
[im 56/67]
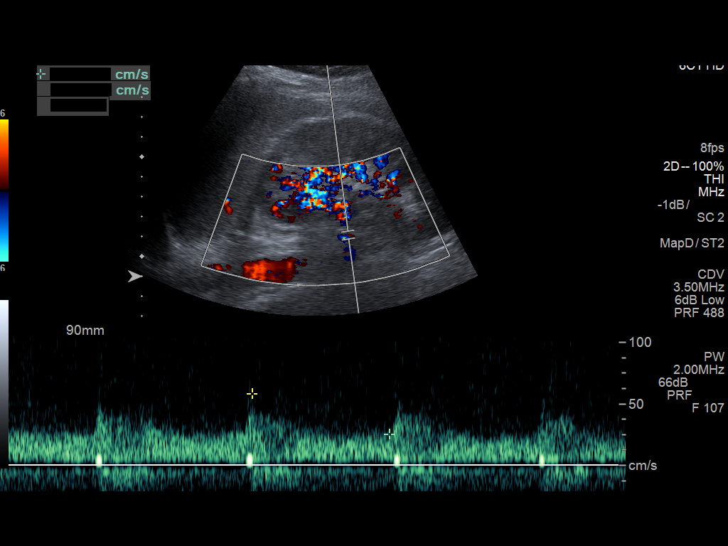
[im 61/67]
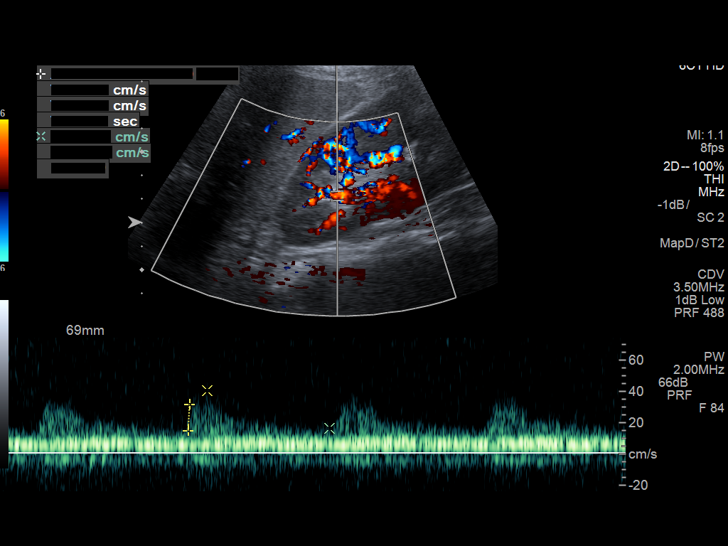
[im 67/67]
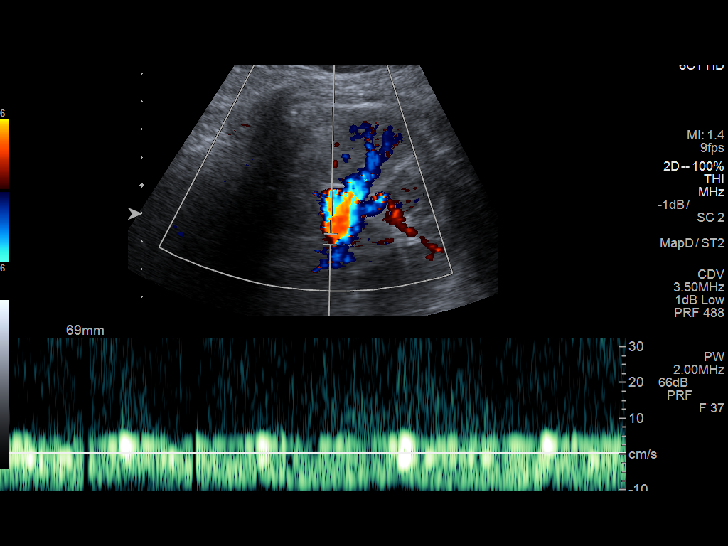

[13 of 25 positions shown; findings below may reference images not displayed]

FINDINGS: Right Kidney:

Normal cortical thickness, echogenicity and size, measuring 10.2 cm
in length. No focal renal lesions. No echogenic renal stones. No
urinary obstruction.

Left Kidney:

Normal cortical thickness, echogenicity and size, measuring 11.6 cm
in length. No focal renal lesions. No echogenic renal stones. No
urinary obstruction.

Bladder:  Appears normal given underdistention.

RENAL DUPLEX ULTRASOUND

Right Renal Artery Velocities:

Origin:  165 cm/sec

Mid:  89 cm/sec

Hilum:  90 cm/sec

Interlobar:  41 cm/sec

Arcuate:  24 Cm/sec

Left Renal Artery Velocities:

Origin:  118 cm/sec

Mid:  144 cm/sec

Hilum:  157 cm/sec

Interlobar:  40 cm/sec

Arcuate:  25 cm/sec

Aortic Velocity:  90 Cm/sec

Right Renal-Aortic Ratios:

Origin:

Mid:

Hilum:

Interlobar:

Arcuate:

Left Renal-Aortic Ratios:

Origin:

Mid:

Hilum:

Interlobar:

Arcuate:

Normal velocities and waveforms are demonstrated throughout the
bilateral renal arteries and renal parenchyma.

The bilateral renal veins appear widely patent.

Incidentally noted increased echogenicity of the incidentally imaged
hepatic parenchyma (representative image 23), suggestive of hepatic
steatosis.
IMPRESSION: 1. No explanation for patient's resistant hypertension,
specifically, no evidence of renal artery stenosis.
2. Findings suggestive of hepatic steatosis. Correlation with LFTs
could be performed as clinically indicated.

## 2018-05-05 ENCOUNTER — Encounter: Payer: Self-pay | Admitting: Gastroenterology

## 2018-05-05 ENCOUNTER — Ambulatory Visit (INDEPENDENT_AMBULATORY_CARE_PROVIDER_SITE_OTHER): Payer: 59 | Admitting: Gastroenterology

## 2018-05-05 VITALS — BP 103/68 | HR 80 | Ht 65.0 in | Wt 170.6 lb

## 2018-05-05 DIAGNOSIS — K219 Gastro-esophageal reflux disease without esophagitis: Secondary | ICD-10-CM | POA: Diagnosis not present

## 2018-05-05 DIAGNOSIS — K5900 Constipation, unspecified: Secondary | ICD-10-CM | POA: Diagnosis not present

## 2018-05-05 DIAGNOSIS — R1084 Generalized abdominal pain: Secondary | ICD-10-CM | POA: Diagnosis not present

## 2018-05-05 NOTE — Progress Notes (Signed)
Jonathon Bellows MD, MRCP(U.K) [address]  [place]  [place],  [address]  Main: [phone]  Fax: [phone]   Gastroenterology Consultation  Referring Provider:     Danelle Berry, NP Primary Care Physician:  [place] Primary Gastroenterologist:  Dr. Jonathon Bellows  Reason for Consultation:     Enteritis         HPI:   Sonya Bauer is a 27 y.o. y/o female referred for consultation & management  by Dr. [facility]    She has been referred for enteritis. She had a CT scan of the abdomen at [facility] in 03/2018 that showed concerns for inflammation of the terminal ileum. I do not have any office visits or other notes indicating the reason for the test to be ordered.   She says that she had the scan for abdominal pain   Abdominal pain: Onset: The pain began around in June around the time of the CT scan , it had began 2 days prior, lasted just for 2 days and resolved completely after a course of antibiotics, she had some diarrhea at that time, no fever. No similar issue in the past  Site :Epigastric, on and off  Radiation: no  Severity :yes  Nature of pain: contraction  Aggravating factors: nothing  Relieving factors :nothing  Weight loss: no  NSAID use: some ibuprofen to see if it would help the pain  PPI use :has heart burn daily , taken something over the counter PRN Prilosec which helps but does not resolve it  Gall bladder surgery: no  Frequency of bowel movements: says she has constipation alternating with diarrhea after he delivery 3 years back , think she may have seen some blood in her stool.  Change in bowel movements: since June there has been a change in bowel hbaits - stools have been looser, , alternating with constipation  Relief with bowel movements: yes  Gas/Bloating/Abdominal distension: bloating and abdominal distension - everytime she eats  Gum at times, no sweet drinks , occasional soda.  No family history of IBD or  colon cancer, no EGD or colonoscopy . Does not smoke.    No joint pains or skin rashes, was having pain and pressure behind his rt eye. She has a hsitory of heart burn and constipation  Past Medical History:  Diagnosis Date  . Complication of anesthesia   . Depression   . Family history of adverse reaction to anesthesia    MOM-HAD SEIZURE DURING SURGERY   . GERD (gastroesophageal reflux disease)    EVERY OTHER DAY  . Headache   . Heart murmur    ASYMPTOMATIC  . Heartburn   . History of kidney stones   . Hypertension   . Pregnancy induced hypertension   . 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 CYSTOSCOPY WITH STENT PLACEMENT Left 10/06/2016   Procedure: CYSTOSCOPY WITH STENT PLACEMENT;  Surgeon: Alexis Frock, MD;  Location: [facility] ORS;  Service: Urology;  Laterality: Left;  . URETEROSCOPY WITH HOLMIUM LASER LITHOTRIPSY Left 10/06/2016   Procedure: URETEROSCOPY WITH HOLMIUM LASER LITHOTRIPSY;  Surgeon: Alexis Frock, MD;  Location: [facility] ORS;  Service: Urology;  Laterality: Left;  . WISDOM TOOTH EXTRACTION     AGE 75    Prior to Admission medications   Medication Sig Start Date End Date Taking? Authorizing Provider  fluticasone (FLONASE) 50 MCG/ACT nasal spray Place into the nose. 01/24/15  Yes [provider]  nortriptyline (PAMELOR) 10 MG capsule Take 30 mg nightly 01/25/18  Yes [provider]  oxyCODONE-acetaminophen (PERCOCET/ROXICET) 5-325 MG tablet Take by mouth. 05/05/16  Yes [provider]  Saline (AYR SALINE NASAL DROPS) 0.65 % (Soln) SOLN 1 drop by Each Nare route as needed. 01/24/15  Yes [provider]  acetaminophen (TYLENOL) 500 MG tablet Take by mouth.    [provider]  acyclovir (ZOVIRAX) 200 MG capsule Take by mouth.    [provider]  amLODipine (NORVASC) 5 MG tablet Take by mouth.    [provider]  Butalbital-APAP-Caffeine 50-300-40 MG CAPS TK 1 C PO Q 6 H  PRF HA 02/15/18   [provider]  cyclobenzaprine (FLEXERIL) 10 MG tablet  05/04/18   [provider]  hydrochlorothiazide (HYDRODIURIL) 25 MG tablet Take by mouth.    [provider]  ibuprofen (ADVIL,MOTRIN) 600 MG tablet Take 1 tablet (600 mg total) by mouth every 6 (six) hours as needed. 09/21/17   Melynda Ripple, MD  lisinopril (PRINIVIL,ZESTRIL) 40 MG tablet TK 1 T PO Q DAY. NOTE DOSE INCREASE 04/04/18   [provider]  loratadine-pseudoephedrine (CLARITIN-D 24-HOUR) 10-240 MG 24 hr tablet Take 1 tablet by mouth daily.    [provider]  medroxyPROGESTERone (DEPO-PROVERA) 150 MG/ML injection Inject 1 mL (150 mg total) every 3 (three) months into the muscle. 08/30/17   Shambley, Melody N, CNM  meloxicam (MOBIC) 15 MG tablet  05/04/18   [provider]  ondansetron (ZOFRAN) 4 MG tablet TK 1 T PO Q 8 H PRF NAUSEA 03/25/18   [provider]  PRENATAL 28-0.8 MG TABS Take by mouth.    [provider]  tamsulosin (FLOMAX) 0.4 MG CAPS capsule Take by mouth.    [provider]  valACYclovir (VALTREX) 500 MG tablet TK 1 T PO D 03/11/18   [provider]    Family History  Problem Relation Age of Onset  . Hypertension Father   . Asthma Mother   . Kidney disease Neg Hx   . Bladder Cancer Neg Hx      Social History   Tobacco Use  . Smoking status: Never Smoker  . Smokeless tobacco: Never Used  Substance Use Topics  . Alcohol use: No  . Drug use: No    Allergies as of 05/05/2018 - Review Complete 11/26/2017  Allergen Reaction Noted  . Azithromycin Nausea And Vomiting 09/23/2016    Review of Systems:    All systems reviewed and negative except where noted in HPI.   Physical Exam:  There were no vitals taken for this visit. No LMP recorded. Patient has had an injection. Psych:  Alert and cooperative. Normal mood and affect. General:   Alert,  Well-developed, well-nourished, pleasant and  cooperative in NAD Head:  Normocephalic and atraumatic. Eyes:  Sclera clear, no icterus.   Conjunctiva pink. Ears:  Normal auditory acuity. Nose:  No deformity, discharge, or lesions. Mouth:  No deformity or lesions,oropharynx pink & moist. Neck:  Supple; no masses or thyromegaly. Lungs:  Respirations even and unlabored.  Clear throughout to auscultation.   No wheezes, crackles, or rhonchi. No acute distress. Heart:  Regular rate and rhythm; no murmurs, clicks, rubs, or gallops. Abdomen:  Normal bowel sounds.  RLQ mild tenderness, No bruits.  Soft, and non-distended without masses, hepatosplenomegaly or hernias noted.  No guarding or rebound tenderness.    Neurologic:  Alert and oriented x3;  grossly normal neurologically. Skin:  Intact without significant lesions  or rashes. No jaundice. Lymph Nodes:  No significant cervical adenopathy. Psych:  Alert and cooperative. Normal mood and affect.  Imaging Studies: No results found.  Assessment and Plan:   LAQUENTA WHITSELL is a 27 y.o. y/o female has been referred for abnormal CT enterogram showing ileitis. Explained differentials are infectious vs crohns disease. She still has some RLQ tenderness , suggested a colonoscopy , she is not keen at present and is willing to undergo a MR enterogram at this time. For her GERD counseled her on life style changes , trial of PPI prilosec 40 mg OTC for 6 weeks, high fiber diet for constipation +/- miralax. Patient information on high fiber diet and GERD provided.     Follow up in 4-5 weeks   Dr Jonathon Bellows MD,MRCP(U.K)

## 2018-05-05 NOTE — Patient Instructions (Signed)
High-Fiber Diet  Fiber, also called dietary fiber, is a type of carbohydrate found in fruits, vegetables, whole grains, and beans. A high-fiber diet can have many health benefits. Your health care provider may recommend a high-fiber diet to help:  · Prevent constipation. Fiber can make your bowel movements more regular.  · Lower your cholesterol.  · Relieve hemorrhoids, uncomplicated diverticulosis, or irritable bowel syndrome.  · Prevent overeating as part of a weight-loss plan.  · Prevent heart disease, type 2 diabetes, and certain cancers.    What is my plan?  The recommended daily intake of fiber includes:  · 38 grams for men under age 50.  · 30 grams for men over age 50.  · 25 grams for women under age 50.  · 21 grams for women over age 50.    You can get the recommended daily intake of dietary fiber by eating a variety of fruits, vegetables, grains, and beans. Your health care provider may also recommend a fiber supplement if it is not possible to get enough fiber through your diet.  What do I need to know about a high-fiber diet?  · Fiber supplements have not been widely studied for their effectiveness, so it is better to get fiber through food sources.  · Always check the fiber content on the nutrition facts label of any prepackaged food. Look for foods that contain at least 5 grams of fiber per serving.  · Ask your dietitian if you have questions about specific foods that are related to your condition, especially if those foods are not listed in the following section.  · Increase your daily fiber consumption gradually. Increasing your intake of dietary fiber too quickly may cause bloating, cramping, or gas.  · Drink plenty of water. Water helps you to digest fiber.  What foods can I eat?  Grains  Whole-grain breads. Multigrain cereal. Oats and oatmeal. Brown rice. Barley. Bulgur wheat. Millet. Bran muffins. Popcorn. Rye wafer crackers.  Vegetables   Sweet potatoes. Spinach. Kale. Artichokes. Cabbage. Broccoli. Green peas. Carrots. Squash.  Fruits  Berries. Pears. Apples. Oranges. Avocados. Prunes and raisins. Dried figs.  Meats and Other Protein Sources  Navy, kidney, pinto, and soy beans. Split peas. Lentils. Nuts and seeds.  Dairy  Fiber-fortified yogurt.  Beverages  Fiber-fortified soy milk. Fiber-fortified orange juice.  Other  Fiber bars.  The items listed above may not be a complete list of recommended foods or beverages. Contact your dietitian for more options.  What foods are not recommended?  Grains  White bread. Pasta made with refined flour. White rice.  Vegetables  Fried potatoes. Canned vegetables. Well-cooked vegetables.  Fruits  Fruit juice. Cooked, strained fruit.  Meats and Other Protein Sources  Fatty cuts of meat. Fried poultry or fried fish.  Dairy  Milk. Yogurt. Cream cheese. Sour cream.  Beverages  Soft drinks.  Other  Cakes and pastries. Butter and oils.  The items listed above may not be a complete list of foods and beverages to avoid. Contact your dietitian for more information.  What are some tips for including high-fiber foods in my diet?  · Eat a wide variety of high-fiber foods.  · Make sure that half of all grains consumed each day are whole grains.  · Replace breads and cereals made from refined flour or white flour with whole-grain breads and cereals.  · Replace white rice with brown rice, bulgur wheat, or millet.  · Start the day with a breakfast that is high in fiber,   such as a cereal that contains at least 5 grams of fiber per serving.  · Use beans in place of meat in soups, salads, or pasta.  · Eat high-fiber snacks, such as berries, raw vegetables, nuts, or popcorn.  This information is not intended to replace advice given to you by your health care provider. Make sure you discuss any questions you have with your health care provider.  Document Released: 09/28/2005 Document Revised: 03/05/2016 Document Reviewed: 03/13/2014   Elsevier Interactive Patient Education © 2018 Else[employer] Inc[employer]

## 2018-05-11 ENCOUNTER — Other Ambulatory Visit: Payer: Self-pay

## 2018-05-11 ENCOUNTER — Encounter: Payer: Self-pay | Admitting: *Deleted

## 2018-05-11 NOTE — Discharge Instructions (Signed)
T & A INSTRUCTION SHEET - [facility] °[facility] ° °CREIGHTON VAUGHT, MD °PAUL H. JUENGEL, MD  °[facility] °CHAPMAN MCQUEEN, MD ° °[address] [place], [place] [address] TEL. [phone] °[address] [place] [address] [phone] ° °INFORMATION SHEET FOR A TONSILLECTOMY AND ADENDOIDECTOMY ° °About Your Tonsils and Adenoids ° The tonsils and adenoids are normal body tissues that are part of our immune system.  They normally help to protect us against diseases that may enter our mouth and nose.  However, sometimes the tonsils and/or adenoids become too large and obstruct our breathing, especially at night. °  ° If either of these things happen it helps to remove the tonsils and adenoids in order to become healthier. The operation to remove the tonsils and adenoids is called a tonsillectomy and adenoidectomy. ° °The Location of Your Tonsils and Adenoids ° The tonsils are located in the back of the throat on both side and sit in a cradle of muscles. The adenoids are located in the roof of the mouth, behind the nose, and closely associated with the opening of the Eustachian tube to the ear. ° °Surgery on Tonsils and Adenoids ° A tonsillectomy and adenoidectomy is a short operation which takes about thirty minutes.  This includes being put to sleep and being awakened.  Tonsillectomies and adenoidectomies are performed at [facility] and may require observation period in the recovery room prior to going home. ° °Following the Operation for a Tonsillectomy ° A cautery machine is used to control bleeding.  Bleeding from a tonsillectomy and adenoidectomy is minimal and postoperatively the risk of bleeding is approximately four percent, although this rarely life threatening. ° ° ° °After your tonsillectomy and adenoidectomy post-op care at home: ° °1. Our patients are able to go home the same day.  You may be given prescriptions for pain  medications and antibiotics, if indicated. °2. It is extremely important to remember that fluid intake is of utmost importance after a tonsillectomy.  The amount that you drink must be maintained in the postoperative period.  A good indication of whether a child is getting enough fluid is whether his/her urine output is constant.  As long as children are urinating or wetting their diaper every 6 - 8 hours this is usually enough fluid intake.   °3. Although rare, this is a risk of some bleeding in the first ten days after surgery.  This is usually occurs between day five and nine postoperatively.  This risk of bleeding is approximately four percent.  If you or your child should have any bleeding you should remain calm and notify our office or go directly to the Emergency Room at [facility] where they will contact us. Our doctors are available seven days a week for notification.  We recommend sitting up quietly in a chair, place an ice pack on the front of the neck and spitting out the blood gently until we are able to contact you.  Adults should gargle gently with ice water and this may help stop the bleeding.  If the bleeding does not stop after a short time, i.e. 10 to 15 minutes, or seems to be increasing again, please contact us or go to the hospital.   °4. It is common for the pain to be worse at 5 - 7 days postoperatively.  This occurs because the “scab” is peeling off and the mucous membrane (skin of   the throat) is growing back where the tonsils were.   °5. It is common for a low-grade fever, less than 102, during the first week after a tonsillectomy and adenoidectomy.  It is usually due to not drinking enough liquids, and we suggest your use liquid Tylenol or the pain medicine with Tylenol prescribed in order to keep your temperature below 102.  Please follow the directions on the back of the bottle. °6. Do not take aspirin or any products that contain aspirin such as Bufferin, Anacin,  Ecotrin, aspirin gum, Goodies, BC headache powders, etc., after a T&A because it can promote bleeding.  Please check with our office before administering any other medication that may been prescribed by other doctors during the two week post-operative period. °7. If you happen to look in the mirror or into your child’s mouth you will see white/gray patches on the back of the throat.  This is what a scab looks like in the mouth and is normal after having a T&A.  It will disappear once the tonsil area heals completely. However, it may cause a noticeable odor, and this too will disappear with time.     °8. You or your child may experience ear pain after having a T&A.  This is called referred pain and comes from the throat, but it is felt in the ears.  Ear pain is quite common and expected.  It will usually go away after ten days.  There is usually nothing wrong with the ears, and it is primarily due to the healing area stimulating the nerve to the ear that runs along the side of the throat.  Use either the prescribed pain medicine or Tylenol as needed.  °9. The throat tissues after a tonsillectomy are obviously sensitive.  Smoking around children who have had a tonsillectomy significantly increases the risk of bleeding.  DO NOT SMOKE!  ° °General Anesthesia, Adult, Care After °These instructions provide you with information about caring for yourself after your procedure. Your health care provider may also give you more specific instructions. Your treatment has been planned according to current medical practices, but problems sometimes occur. Call your health care provider if you have any problems or questions after your procedure. °What can I expect after the procedure? °After the procedure, it is common to have: °· Vomiting. °· A sore throat. °· Mental slowness. ° °It is common to feel: °· Nauseous. °· Cold or shivery. °· Sleepy. °· Tired. °· Sore or achy, even in parts of your body where you did not have  surgery. ° °Follow these instructions at home: °For at least 24 hours after the procedure: °· Do not: °? Participate in activities where you could fall or become injured. °? Drive. °? Use heavy machinery. °? Drink alcohol. °? Take sleeping pills or medicines that cause drowsiness. °? Make important decisions or sign legal documents. °? Take care of children on your own. °· Rest. °Eating and drinking °· If you vomit, drink water, juice, or soup when you can drink without vomiting. °· Drink enough fluid to keep your urine clear or pale yellow. °· Make sure you have little or no nausea before eating solid foods. °· Follow the diet recommended by your health care provider. °General instructions °· Have a responsible adult stay with you until you are awake and alert. °· Return to your normal activities as told by your health care provider. Ask your health care provider what activities are safe for you. °· Take over-the-counter and   prescription medicines only as told by your health care provider. °· If you smoke, do not smoke without supervision. °· Keep all follow-up visits as told by your health care provider. This is important. °Contact a health care provider if: °· You continue to have nausea or vomiting at home, and medicines are not helpful. °· You cannot drink fluids or start eating again. °· You cannot urinate after 8-12 hours. °· You develop a skin rash. °· You have fever. °· You have increasing redness at the site of your procedure. °Get help right away if: °· You have difficulty breathing. °· You have chest pain. °· You have unexpected bleeding. °· You feel that you are having a life-threatening or urgent problem. °This information is not intended to replace advice given to you by your health care provider. Make sure you discuss any questions you have with your health care provider. °Document Released: 01/04/2001 Document Revised: 03/02/2016 Document Reviewed: 09/12/2015 °Elsevier Interactive Patient Education  © 2018 Elsevier Inc. ° °

## 2018-05-19 ENCOUNTER — Ambulatory Visit: Admission: RE | Admit: 2018-05-19 | Payer: 59 | Source: Ambulatory Visit

## 2018-05-20 ENCOUNTER — Ambulatory Visit: Payer: 59 | Admitting: Anesthesiology

## 2018-05-20 ENCOUNTER — Encounter
Admission: RE | Disposition: A | Discharge status: Home / Self Care | Payer: Self-pay | Source: Ambulatory Visit | Attending: Unknown Physician Specialty

## 2018-05-20 ENCOUNTER — Ambulatory Visit
Admission: RE | Admit: 2018-05-20 | Discharge: 2018-05-20 | Disposition: A | Discharge status: Home / Self Care | Payer: 59 | Source: Ambulatory Visit | Attending: Unknown Physician Specialty | Admitting: Unknown Physician Specialty

## 2018-05-20 DIAGNOSIS — J358 Other chronic diseases of tonsils and adenoids: Secondary | ICD-10-CM | POA: Diagnosis not present

## 2018-05-20 DIAGNOSIS — J3501 Chronic tonsillitis: Secondary | ICD-10-CM | POA: Diagnosis not present

## 2018-05-20 DIAGNOSIS — Z79899 Other long term (current) drug therapy: Secondary | ICD-10-CM | POA: Diagnosis not present

## 2018-05-20 DIAGNOSIS — Z791 Long term (current) use of non-steroidal anti-inflammatories (NSAID): Secondary | ICD-10-CM | POA: Diagnosis not present

## 2018-05-20 DIAGNOSIS — I10 Essential (primary) hypertension: Secondary | ICD-10-CM | POA: Insufficient documentation

## 2018-05-20 HISTORY — DX: Motion sickness, initial encounter: T75.3XXA

## 2018-05-20 HISTORY — DX: Calculus of kidney: N20.0

## 2018-05-20 HISTORY — PX: TONSILLECTOMY AND ADENOIDECTOMY: SHX28

## 2018-05-20 SURGERY — TONSILLECTOMY AND ADENOIDECTOMY
Anesthesia: General | Site: Throat | Wound class: "Clean Contaminated "

## 2018-05-20 MED ORDER — LIDOCAINE HCL (CARDIAC) PF 100 MG/5ML IV SOSY
PREFILLED_SYRINGE | INTRAVENOUS | Status: DC | PRN
  Administered 2018-05-20: 40 mg via INTRAVENOUS

## 2018-05-20 MED ORDER — GLYCOPYRROLATE 0.2 MG/ML IJ SOLN
INTRAMUSCULAR | Status: DC | PRN
  Administered 2018-05-20: 0.1 mg via INTRAVENOUS

## 2018-05-20 MED ORDER — LACTATED RINGERS IV SOLN
INTRAVENOUS | Status: DC
  Administered 2018-05-20: 10:00:00 via INTRAVENOUS

## 2018-05-20 MED ORDER — LACTATED RINGERS IV SOLN: INTRAVENOUS | Status: DC

## 2018-05-20 MED ORDER — OXYCODONE HCL 5 MG/5ML PO SOLN
5.0000 mg | Freq: Once | ORAL | Status: AC | PRN
  Administered 2018-05-20: 5 mg via ORAL

## 2018-05-20 MED ORDER — ONDANSETRON HCL 4 MG/2ML IJ SOLN: 4.0000 mg | Freq: Once | INTRAMUSCULAR | Status: DC | PRN

## 2018-05-20 MED ORDER — ACETAMINOPHEN 10 MG/ML IV SOLN: 1000.0000 mg | Freq: Once | INTRAVENOUS | Status: DC | PRN

## 2018-05-20 MED ORDER — SUCCINYLCHOLINE CHLORIDE 20 MG/ML IJ SOLN
INTRAMUSCULAR | Status: DC | PRN
  Administered 2018-05-20: 80 mg via INTRAVENOUS

## 2018-05-20 MED ORDER — OXYCODONE HCL 5 MG PO TABS: 5.0000 mg | ORAL_TABLET | Freq: Once | ORAL | Status: AC | PRN

## 2018-05-20 MED ORDER — SCOPOLAMINE 1 MG/3DAYS TD PT72
1.0000 | MEDICATED_PATCH | Freq: Once | TRANSDERMAL | Status: DC
  Administered 2018-05-20: 1.5 mg via TRANSDERMAL

## 2018-05-20 MED ORDER — ONDANSETRON HCL 4 MG/2ML IJ SOLN
INTRAMUSCULAR | Status: DC | PRN
  Administered 2018-05-20: 4 mg via INTRAVENOUS

## 2018-05-20 MED ORDER — HYDROCODONE-ACETAMINOPHEN 7.5-325 MG/15ML PO SOLN: 15.0000 mL | Freq: Four times a day (QID) | ORAL | 0 refills | Status: DC | PRN

## 2018-05-20 MED ORDER — FENTANYL CITRATE (PF) 100 MCG/2ML IJ SOLN
25.0000 ug | INTRAMUSCULAR | Status: DC | PRN
  Administered 2018-05-20: 25 ug via INTRAVENOUS

## 2018-05-20 MED ORDER — DEXAMETHASONE SODIUM PHOSPHATE 4 MG/ML IJ SOLN
INTRAMUSCULAR | Status: DC | PRN
  Administered 2018-05-20: 10 mg via INTRAVENOUS

## 2018-05-20 MED ORDER — FENTANYL CITRATE (PF) 100 MCG/2ML IJ SOLN
INTRAMUSCULAR | Status: DC | PRN
  Administered 2018-05-20: 100 ug via INTRAVENOUS

## 2018-05-20 MED ORDER — BUPIVACAINE HCL (PF) 0.5 % IJ SOLN
INTRAMUSCULAR | Status: DC | PRN
  Administered 2018-05-20: 6 mL

## 2018-05-20 MED ORDER — PROPOFOL 10 MG/ML IV BOLUS
INTRAVENOUS | Status: DC | PRN
  Administered 2018-05-20: 150 mg via INTRAVENOUS

## 2018-05-20 MED ORDER — MIDAZOLAM HCL 5 MG/5ML IJ SOLN
INTRAMUSCULAR | Status: DC | PRN
  Administered 2018-05-20: 2 mg via INTRAVENOUS

## 2018-05-20 MED ORDER — ACETAMINOPHEN 10 MG/ML IV SOLN
1000.0000 mg | Freq: Once | INTRAVENOUS | Status: AC
  Administered 2018-05-20: 1000 mg via INTRAVENOUS

## 2018-05-20 SURGICAL SUPPLY — 23 items
"PENCIL ELECTRO HAND CTR " (MISCELLANEOUS) ×1 IMPLANT
CANISTER SUCT 1200ML W/VALVE (MISCELLANEOUS) ×2 IMPLANT
CATH RUBBER RED 8F (CATHETERS) ×2 IMPLANT
COAG SUCT 10F 3.5MM HAND CTRL (MISCELLANEOUS) ×2 IMPLANT
DRAPE HEAD BAR (DRAPES) ×2 IMPLANT
ELECT CAUTERY BLADE TIP 2.5 (TIP) ×2
ELECT REM PT RETURN 9FT ADLT (ELECTROSURGICAL) ×2
ELECTRODE CAUTERY BLDE TIP 2.5 (TIP) ×1 IMPLANT
ELECTRODE REM PT RTRN 9FT ADLT (ELECTROSURGICAL) ×1 IMPLANT
GLOVE BIO SURGEON STRL SZ7.5 (GLOVE) ×2 IMPLANT
HANDLE SUCTION POOLE (INSTRUMENTS) ×1 IMPLANT
KIT TURNOVER KIT A (KITS) ×2 IMPLANT
NDL HYPO 25GX1X1/2 BEV (NEEDLE) ×1 IMPLANT
NEEDLE HYPO 25GX1X1/2 BEV (NEEDLE) ×2 IMPLANT
NS IRRIG 500ML POUR BTL (IV SOLUTION) ×2 IMPLANT
PACK TONSIL/ADENOIDS (PACKS) ×2 IMPLANT
PENCIL ELECTRO HAND CTR (MISCELLANEOUS) ×2 IMPLANT
SOL ANTI-FOG 6CC FOG-OUT (MISCELLANEOUS) ×1 IMPLANT
SOL FOG-OUT ANTI-FOG 6CC (MISCELLANEOUS) ×1
SPONGE TONSIL 1 RF SGL (DISPOSABLE) ×2 IMPLANT
STRAP BODY AND KNEE 60X3 (MISCELLANEOUS) ×2 IMPLANT
SUCTION POOLE HANDLE (INSTRUMENTS) ×2
SYR 10ML LL (SYRINGE) ×2 IMPLANT

## 2018-05-20 NOTE — Op Note (Signed)
PREOPERATIVE DIAGNOSIS:  TONSIL STONES CHRONIC TONSILITIS  POSTOPERATIVE DIAGNOSIS:  Chronic Tonsillitis  OPERATION:  Tonsillectomy.  SURGEON:  Roena Malady, MD  ANESTHESIA:  General endotracheal.  OPERATIVE FINDINGS:  Large tonsils.  DESCRIPTION OF THE PROCEDURE: Sonya Bauer was identified in the holding area and taken to the operating room and placed in the supine position.  After general endotracheal anesthesia, the table was turned 45 degrees and the patient was draped in the usual fashion for a tonsillectomy.  A mouth gag was inserted into the oral cavity.  There were large tonsils.  Examination of the nasopharynx with a laryngeal mirror showed no significant adenoid tissue.  Therefore, the surgery proceeded with tonsillectomy.  Beginning on the left-hand side a tenaculum was used to grasp the tonsil and the Bovie cautery was used to dissect it free from the fossa.  In a similar fashion, the right tonsil was removed.  Meticulous hemostasis was achieved using the Bovie cautery.  With both tonsils removed and no active bleeding, 0.5% plain Marcaine was used to inject the anterior and posterior tonsillar pillars bilaterally.  A total of 56ml was used.  The patient tolerated the procedure well and was awakened in the operating room and taken to the recovery room in stable condition.   CULTURES:  None.  SPECIMENS:  Tonsils.  ESTIMATED BLOOD LOSS:  Less than 10 ml.  Roena Malady  05/20/2018  12:10 PM

## 2018-05-20 NOTE — Transfer of Care (Signed)
Immediate Anesthesia Transfer of Care Note  Patient: Sonya Bauer  Procedure(s) Performed: TONSILLECTOMY (N/A Throat)  Patient Location: PACU  Anesthesia Type: General  Level of Consciousness: awake, alert  and patient cooperative  Airway and Oxygen Therapy: Patient Spontanous Breathing and Patient connected to supplemental oxygen  Post-op Assessment: Post-op Vital signs reviewed, Patient's Cardiovascular Status Stable, Respiratory Function Stable, Patent Airway and No signs of Nausea or vomiting  Post-op Vital Signs: Reviewed and stable  Complications: No apparent anesthesia complications

## 2018-05-20 NOTE — Anesthesia Preprocedure Evaluation (Signed)
Anesthesia Evaluation  Patient identified by MRN, date of birth, ID band Patient awake    Reviewed: Allergy & Precautions, NPO status , Patient's Chart, lab work & pertinent test results  History of Anesthesia Complications Negative for: history of anesthetic complications  Airway Mallampati: I  TM Distance: >3 FB Neck ROM: Full    Dental no notable dental hx.    Pulmonary neg pulmonary ROS,    Pulmonary exam normal breath sounds clear to auscultation- rhonchi       Cardiovascular Exercise Tolerance: Good hypertension,  Rhythm:Regular Rate:Normal + Systolic murmurs Murmur    Neuro/Psych  Headaches, PSYCHIATRIC DISORDERS Depression    GI/Hepatic GERD  ,  Endo/Other  negative endocrine ROS  Renal/GU Renal disease (hx nephrolithiasis)     Musculoskeletal   Abdominal   Peds  Hematology negative hematology ROS (+)   Anesthesia Other Findings   Reproductive/Obstetrics                             Anesthesia Physical Anesthesia Plan  ASA: II  Anesthesia Plan: General   Post-op Pain Management:    Induction: Intravenous  PONV Risk Score and Plan: 3 and Ondansetron, Dexamethasone and Scopolamine patch - Pre-op  Airway Management Planned: Oral ETT  Additional Equipment:   Intra-op Plan:   Post-operative Plan: Extubation in OR  Informed Consent: I have reviewed the patients History and Physical, chart, labs and discussed the procedure including the risks, benefits and alternatives for the proposed anesthesia with the patient or authorized representative who has indicated his/her understanding and acceptance.     Plan Discussed with: CRNA  Anesthesia Plan Comments:         Anesthesia Quick Evaluation

## 2018-05-20 NOTE — Anesthesia Postprocedure Evaluation (Signed)
Anesthesia Post Note  Patient: Sonya Bauer  Procedure(s) Performed: TONSILLECTOMY (N/A Throat)  Patient location during evaluation: PACU Anesthesia Type: General Level of consciousness: awake and alert, oriented and patient cooperative Pain management: pain level controlled Vital Signs Assessment: post-procedure vital signs reviewed and stable Respiratory status: spontaneous breathing, nonlabored ventilation and respiratory function stable Cardiovascular status: blood pressure returned to baseline and stable Postop Assessment: adequate PO intake Anesthetic complications: no    Darrin Nipper

## 2018-05-20 NOTE — Anesthesia Procedure Notes (Signed)
Procedure Name: Intubation Date/Time: 05/20/2018 11:56 AM Performed by: Mayme Genta, CRNA Pre-anesthesia Checklist: Patient identified, Emergency Drugs available, Suction available, Patient being monitored and Timeout performed Patient Re-evaluated:Patient Re-evaluated prior to induction Oxygen Delivery Method: Circle system utilized Preoxygenation: Pre-oxygenation with 100% oxygen Induction Type: IV induction Ventilation: Mask ventilation without difficulty Laryngoscope Size: Miller and 2 Grade View: Grade I Tube type: Oral Rae Tube size: 7.0 mm Number of attempts: 1 Placement Confirmation: ETT inserted through vocal cords under direct vision,  positive ETCO2 and breath sounds checked- equal and bilateral Tube secured with: Tape Dental Injury: Teeth and Oropharynx as per pre-operative assessment

## 2018-05-20 NOTE — H&P (Signed)
The patient's history has been reviewed, patient examined, no change in status, stable for surgery.  Questions were answered to the patients satisfaction.  

## 2018-05-23 ENCOUNTER — Encounter: Payer: Self-pay | Admitting: Unknown Physician Specialty

## 2018-05-24 LAB — SURGICAL PATHOLOGY

## 2018-06-01 ENCOUNTER — Ambulatory Visit
Admission: RE | Admit: 2018-06-01 | Discharge: 2018-06-01 | Disposition: A | Discharge status: Home / Self Care | Payer: 59 | Source: Ambulatory Visit | Attending: Gastroenterology | Admitting: Gastroenterology

## 2018-06-01 DIAGNOSIS — R1084 Generalized abdominal pain: Secondary | ICD-10-CM | POA: Insufficient documentation

## 2018-06-01 MED ORDER — GADOBENATE DIMEGLUMINE 529 MG/ML IV SOLN
15.0000 mL | Freq: Once | INTRAVENOUS | Status: AC | PRN
  Administered 2018-06-01: 15 mL via INTRAVENOUS

## 2018-06-02 ENCOUNTER — Encounter: Payer: Self-pay | Admitting: Gastroenterology

## 2018-06-16 ENCOUNTER — Ambulatory Visit: Payer: 59 | Admitting: Gastroenterology

## 2018-07-21 ENCOUNTER — Telehealth: Payer: Self-pay | Admitting: Gastroenterology

## 2018-07-21 ENCOUNTER — Telehealth: Payer: Self-pay | Admitting: Obstetrics and Gynecology

## 2018-07-21 NOTE — Telephone Encounter (Signed)
Yes please stop current BP medication. Also sounds like she might have the stomach bug that is going around. Please let us know if she needs anything to treat vomiting.

## 2018-07-21 NOTE — Telephone Encounter (Signed)
The patient is on BP meds and is concerned about taking the medication with her current "early" pregnancy, and also she woke up last night with abdominal pain, nausea, vomiting and diarrhea at 1 AM; has been episodic since.  No other symptoms. Please advise, thanks.

## 2018-07-21 NOTE — Telephone Encounter (Signed)
Pt is to call in the am if she is still having pain

## 2018-07-21 NOTE — Telephone Encounter (Signed)
Hey should she stop BP medication??

## 2018-08-09 ENCOUNTER — Encounter: Payer: Self-pay | Admitting: Obstetrics and Gynecology

## 2018-08-09 ENCOUNTER — Ambulatory Visit: Payer: 59 | Admitting: Obstetrics and Gynecology

## 2018-08-09 VITALS — BP 138/112 | HR 78 | Ht 65.0 in | Wt 169.9 lb

## 2018-08-09 DIAGNOSIS — O219 Vomiting of pregnancy, unspecified: Secondary | ICD-10-CM

## 2018-08-09 DIAGNOSIS — I10 Essential (primary) hypertension: Secondary | ICD-10-CM | POA: Diagnosis not present

## 2018-08-09 DIAGNOSIS — N926 Irregular menstruation, unspecified: Secondary | ICD-10-CM

## 2018-08-09 LAB — POCT URINE PREGNANCY: Preg Test, Ur: POSITIVE — AB

## 2018-08-09 MED ORDER — LABETALOL HCL 100 MG PO TABS: 100.0000 mg | ORAL_TABLET | Freq: Two times a day (BID) | ORAL | 2 refills | Status: DC

## 2018-08-09 NOTE — Patient Instructions (Signed)
First Trimester of Pregnancy The first trimester of pregnancy is from week 1 until the end of week 13 (months 1 through 3). During this time, your baby will begin to develop inside you. At 6-8 weeks, the eyes and face are formed, and the heartbeat can be seen on ultrasound. At the end of 12 weeks, all the baby's organs are formed. Prenatal care is all the medical care you receive before the birth of your baby. Make sure you get good prenatal care and follow all of your doctor's instructions. Follow these instructions at home: Medicines  Take over-the-counter and prescription medicines only as told by your doctor. Some medicines are safe and some medicines are not safe during pregnancy.  Take a prenatal vitamin that contains at least 600 micrograms (mcg) of folic acid.  If you have trouble pooping (constipation), take medicine that will make your stool soft (stool softener) if your doctor approves. Eating and drinking  Eat regular, healthy meals.  Your doctor will tell you the amount of weight gain that is right for you.  Avoid raw meat and uncooked cheese.  If you feel sick to your stomach (nauseous) or throw up (vomit): ? Eat 4 or 5 small meals a day instead of 3 large meals. ? Try eating a few soda crackers. ? Drink liquids between meals instead of during meals.  To prevent constipation: ? Eat foods that are high in fiber, like fresh fruits and vegetables, whole grains, and beans. ? Drink enough fluids to keep your pee (urine) clear or pale yellow. Activity  Exercise only as told by your doctor. Stop exercising if you have cramps or pain in your lower belly (abdomen) or low back.  Do not exercise if it is too hot, too humid, or if you are in a place of great height (high altitude).  Try to avoid standing for long periods of time. Move your legs often if you must stand in one place for a long time.  Avoid heavy lifting.  Wear low-heeled shoes. Sit and stand up straight.  You  can have sex unless your doctor tells you not to. Relieving pain and discomfort  Wear a good support bra if your breasts are sore.  Take warm water baths (sitz baths) to soothe pain or discomfort caused by hemorrhoids. Use hemorrhoid cream if your doctor says it is okay.  Rest with your legs raised if you have leg cramps or low back pain.  If you have puffy, bulging veins (varicose veins) in your legs: ? Wear support hose or compression stockings as told by your doctor. ? Raise (elevate) your feet for 15 minutes, 3-4 times a day. ? Limit salt in your food. Prenatal care  Schedule your prenatal visits by the twelfth week of pregnancy.  Write down your questions. Take them to your prenatal visits.  Keep all your prenatal visits as told by your doctor. This is important. Safety  Wear your seat belt at all times when driving.  Make a list of emergency phone numbers. The list should include numbers for family, friends, the hospital, and police and fire departments. General instructions  Ask your doctor for a referral to a local prenatal class. Begin classes no later than at the start of month 6 of your pregnancy.  Ask for help if you need counseling or if you need help with nutrition. Your doctor can give you advice or tell you where to go for help.  Do not use hot tubs, steam rooms, or   saunas.  Do not douche or use tampons or scented sanitary pads.  Do not cross your legs for long periods of time.  Avoid all herbs and alcohol. Avoid drugs that are not approved by your doctor.  Do not use any tobacco products, including cigarettes, chewing tobacco, and electronic cigarettes. If you need help quitting, ask your doctor. You may get counseling or other support to help you quit.  Avoid cat litter boxes and soil used by cats. These carry germs that can cause birth defects in the baby and can cause a loss of your baby (miscarriage) or stillbirth.  Visit your dentist. At home, brush  your teeth with a soft toothbrush. Be gentle when you floss. Contact a doctor if:  You are dizzy.  You have mild cramps or pressure in your lower belly.  You have a nagging pain in your belly area.  You continue to feel sick to your stomach, you throw up, or you have watery poop (diarrhea).  You have a bad smelling fluid coming from your vagina.  You have pain when you pee (urinate).  You have increased puffiness (swelling) in your face, hands, legs, or ankles. Get help right away if:  You have a fever.  You are leaking fluid from your vagina.  You have spotting or bleeding from your vagina.  You have very bad belly cramping or pain.  You gain or lose weight rapidly.  You throw up blood. It may look like coffee grounds.  You are around people who have German measles, fifth disease, or chickenpox.  You have a very bad headache.  You have shortness of breath.  You have any kind of trauma, such as from a fall or a car accident. Summary  The first trimester of pregnancy is from week 1 until the end of week 13 (months 1 through 3).  To take care of yourself and your unborn baby, you will need to eat healthy meals, take medicines only if your doctor tells you to do so, and do activities that are safe for you and your baby.  Keep all follow-up visits as told by your doctor. This is important as your doctor will have to ensure that your baby is healthy and growing well. This information is not intended to replace advice given to you by your health care provider. Make sure you discuss any questions you have with your health care provider. Document Released: 03/16/2008 Document Revised: 10/06/2016 Document Reviewed: 10/06/2016 Elsevier Interactive Patient Education  2017 [employer]  

## 2018-08-09 NOTE — Progress Notes (Signed)
  Subjective:     Patient ID: Sonya Bauer, female   DOB: [birth year], 27 y.o.   MRN: [id number]  HPI Here for pregnancy confirmation, reports normal LMP 06/23/18 lasting 4-5 days, giving [facility] 03/30/19 and EGA [redacted]w[redacted]d. Does report shooting pain on right side of neck, causing a headache daily. Has really relaxed muscles. Sees chiropractor. G2P1 with SVD 38 week, with bedrest at 36 weeks for BP. Is taking probiotics for upper stomach pain and it has helped a lot. OK to keep taking.   Review of Systems  Gastrointestinal: Positive for nausea and vomiting.  Neurological: Positive for headaches.  All other systems reviewed and are negative.      Objective:   Physical Exam A&Ox4 Well groomed female in no distress. Blood pressure (!) 138/112, pulse 78, height 5\' 5"  (1.651 m), weight 169 lb 14.4 oz (77.1 kg), last menstrual period 06/23/2018. UPT+ PE not indicated.     Assessment:     Missed menses Hypertension Nausea and vomiting in 1st trimester.  H/o renal stones PP    Plan:    bonjesta samples given To restart BP meds, labetolol 100 bid prescribed, along with adding a baby aspirin daily. Viability scan in 2 weeks with nurse intake and labs, then 5 weeks New OB PE with me.

## 2018-08-10 ENCOUNTER — Other Ambulatory Visit: Payer: Self-pay | Admitting: Obstetrics and Gynecology

## 2018-08-10 MED ORDER — NIFEDIPINE ER OSMOTIC RELEASE 30 MG PO TB24: 30.0000 mg | ORAL_TABLET | Freq: Every day | ORAL | 2 refills | Status: DC

## 2018-08-16 ENCOUNTER — Other Ambulatory Visit: Payer: Self-pay | Admitting: *Deleted

## 2018-08-16 MED ORDER — ONDANSETRON 4 MG PO TBDP: 4.0000 mg | ORAL_TABLET | Freq: Three times a day (TID) | ORAL | 0 refills | Status: DC | PRN

## 2018-08-23 ENCOUNTER — Ambulatory Visit (INDEPENDENT_AMBULATORY_CARE_PROVIDER_SITE_OTHER): Payer: 59

## 2018-08-23 ENCOUNTER — Ambulatory Visit: Payer: 59

## 2018-08-23 ENCOUNTER — Other Ambulatory Visit: Payer: Self-pay | Admitting: Obstetrics and Gynecology

## 2018-08-23 VITALS — BP 150/109 | HR 75 | Ht 65.0 in | Wt 169.0 lb

## 2018-08-23 DIAGNOSIS — O3411 Maternal care for benign tumor of corpus uteri, first trimester: Secondary | ICD-10-CM | POA: Diagnosis not present

## 2018-08-23 DIAGNOSIS — N926 Irregular menstruation, unspecified: Secondary | ICD-10-CM

## 2018-08-23 DIAGNOSIS — Z3A08 8 weeks gestation of pregnancy: Secondary | ICD-10-CM

## 2018-08-23 DIAGNOSIS — N8311 Corpus luteum cyst of right ovary: Secondary | ICD-10-CM | POA: Diagnosis not present

## 2018-08-23 MED ORDER — PROMETHAZINE HCL 25 MG PO TABS: 25.0000 mg | ORAL_TABLET | Freq: Four times a day (QID) | ORAL | 1 refills | Status: DC | PRN

## 2018-08-23 MED ORDER — ONDANSETRON 4 MG PO TBDP: 4.0000 mg | ORAL_TABLET | Freq: Three times a day (TID) | ORAL | 3 refills | Status: DC | PRN

## 2018-08-23 NOTE — Patient Instructions (Signed)
First Trimester of Pregnancy The first trimester of pregnancy is from week 1 until the end of week 13 (months 1 through 3). During this time, your baby will begin to develop inside you. At 6-8 weeks, the eyes and face are formed, and the heartbeat can be seen on ultrasound. At the end of 12 weeks, all the baby's organs are formed. Prenatal care is all the medical care you receive before the birth of your baby. Make sure you get good prenatal care and follow all of your doctor's instructions. Follow these instructions at home: Medicines  Take over-the-counter and prescription medicines only as told by your doctor. Some medicines are safe and some medicines are not safe during pregnancy.  Take a prenatal vitamin that contains at least 600 micrograms (mcg) of folic acid.  If you have trouble pooping (constipation), take medicine that will make your stool soft (stool softener) if your doctor approves. Eating and drinking  Eat regular, healthy meals.  Your doctor will tell you the amount of weight gain that is right for you.  Avoid raw meat and uncooked cheese.  If you feel sick to your stomach (nauseous) or throw up (vomit): ? Eat 4 or 5 small meals a day instead of 3 large meals. ? Try eating a few soda crackers. ? Drink liquids between meals instead of during meals.  To prevent constipation: ? Eat foods that are high in fiber, like fresh fruits and vegetables, whole grains, and beans. ? Drink enough fluids to keep your pee (urine) clear or pale yellow. Activity  Exercise only as told by your doctor. Stop exercising if you have cramps or pain in your lower belly (abdomen) or low back.  Do not exercise if it is too hot, too humid, or if you are in a place of great height (high altitude).  Try to avoid standing for long periods of time. Move your legs often if you must stand in one place for a long time.  Avoid heavy lifting.  Wear low-heeled shoes. Sit and stand up straight.  You  can have sex unless your doctor tells you not to. Relieving pain and discomfort  Wear a good support bra if your breasts are sore.  Take warm water baths (sitz baths) to soothe pain or discomfort caused by hemorrhoids. Use hemorrhoid cream if your doctor says it is okay.  Rest with your legs raised if you have leg cramps or low back pain.  If you have puffy, bulging veins (varicose veins) in your legs: ? Wear support hose or compression stockings as told by your doctor. ? Raise (elevate) your feet for 15 minutes, 3-4 times a day. ? Limit salt in your food. Prenatal care  Schedule your prenatal visits by the twelfth week of pregnancy.  Write down your questions. Take them to your prenatal visits.  Keep all your prenatal visits as told by your doctor. This is important. Safety  Wear your seat belt at all times when driving.  Make a list of emergency phone numbers. The list should include numbers for family, friends, the hospital, and police and fire departments. General instructions  Ask your doctor for a referral to a local prenatal class. Begin classes no later than at the start of month 6 of your pregnancy.  Ask for help if you need counseling or if you need help with nutrition. Your doctor can give you advice or tell you where to go for help.  Do not use hot tubs, steam rooms, or   saunas.  Do not douche or use tampons or scented sanitary pads.  Do not cross your legs for long periods of time.  Avoid all herbs and alcohol. Avoid drugs that are not approved by your doctor.  Do not use any tobacco products, including cigarettes, chewing tobacco, and electronic cigarettes. If you need help quitting, ask your doctor. You may get counseling or other support to help you quit.  Avoid cat litter boxes and soil used by cats. These carry germs that can cause birth defects in the baby and can cause a loss of your baby (miscarriage) or stillbirth.  Visit your dentist. At home, brush  your teeth with a soft toothbrush. Be gentle when you floss. Contact a doctor if:  You are dizzy.  You have mild cramps or pressure in your lower belly.  You have a nagging pain in your belly area.  You continue to feel sick to your stomach, you throw up, or you have watery poop (diarrhea).  You have a bad smelling fluid coming from your vagina.  You have pain when you pee (urinate).  You have increased puffiness (swelling) in your face, hands, legs, or ankles. Get help right away if:  You have a fever.  You are leaking fluid from your vagina.  You have spotting or bleeding from your vagina.  You have very bad belly cramping or pain.  You gain or lose weight rapidly.  You throw up blood. It may look like coffee grounds.  You are around people who have German measles, fifth disease, or chickenpox.  You have a very bad headache.  You have shortness of breath.  You have any kind of trauma, such as from a fall or a car accident. Summary  The first trimester of pregnancy is from week 1 until the end of week 13 (months 1 through 3).  To take care of yourself and your unborn baby, you will need to eat healthy meals, take medicines only if your doctor tells you to do so, and do activities that are safe for you and your baby.  Keep all follow-up visits as told by your doctor. This is important as your doctor will have to ensure that your baby is healthy and growing well. This information is not intended to replace advice given to you by your health care provider. Make sure you discuss any questions you have with your health care provider. Document Released: 03/16/2008 Document Revised: 10/06/2016 Document Reviewed: 10/06/2016 Elsevier Interactive Patient Education  2017 [employer]  

## 2018-08-23 NOTE — Progress Notes (Unsigned)
Sonya Bauer presents for NOB nurse interview visit. Pregnancy confirmation done here at [facility].  G- 2.  P- 1   . Pregnancy education material explained and given. No__ cats in the home. NOB labs ordered. Marland Kitchen HIV labs and Drug screen were explained optional and she did not decline. Drug screen ordered/declined. PNV encouraged. Genetic screening options discussed. Genetic testing: undecided, will discuss with provider, financial policy and FMLA discussed pt voiced understanding

## 2018-08-24 LAB — MICROSCOPIC EXAMINATION: CASTS: NONE SEEN /LPF

## 2018-08-24 LAB — HEPATITIS B SURFACE ANTIGEN: Hepatitis B Surface Ag: NEGATIVE

## 2018-08-24 LAB — URINALYSIS, ROUTINE W REFLEX MICROSCOPIC
BILIRUBIN UA: NEGATIVE
GLUCOSE, UA: NEGATIVE
NITRITE UA: NEGATIVE
PROTEIN UA: NEGATIVE
RBC UA: NEGATIVE
SPEC GRAV UA: 1.022 (ref 1.005–1.030)
UUROB: 0.2 mg/dL (ref 0.2–1.0)
pH, UA: 5.5 (ref 5.0–7.5)

## 2018-08-24 LAB — HGB SOLU + RFLX FRAC: Sickle Solubility Test - HGBRFX: NEGATIVE

## 2018-08-24 LAB — RUBELLA SCREEN: Rubella Antibodies, IGG: 7.57 index (ref >0.99)

## 2018-08-24 LAB — OB RESULTS CONSOLE VARICELLA ZOSTER ANTIBODY, IGG: Varicella: IMMUNE

## 2018-08-24 LAB — ABO AND RH: Rh Factor: POSITIVE

## 2018-08-24 LAB — GC/CHLAMYDIA PROBE AMP
Chlamydia trachomatis, NAA: NEGATIVE
Neisseria gonorrhoeae by PCR: NEGATIVE

## 2018-08-24 LAB — ANTIBODY SCREEN: Antibody Screen: NEGATIVE

## 2018-08-24 LAB — HIV ANTIBODY (ROUTINE TESTING W REFLEX): HIV SCREEN 4TH GENERATION: NONREACTIVE

## 2018-08-24 LAB — VARICELLA ZOSTER ANTIBODY, IGG: VARICELLA: 1258 {index} (ref >165)

## 2018-08-24 LAB — RPR: RPR: NONREACTIVE

## 2018-08-25 LAB — URINE CULTURE: ORGANISM ID, BACTERIA: NO GROWTH

## 2018-08-29 ENCOUNTER — Telehealth: Payer: Self-pay | Admitting: Obstetrics and Gynecology

## 2018-08-29 NOTE — Telephone Encounter (Signed)
The patient asked if she is supposed to take two pills at the same time or one in the AM and one in the PM of her BP med; please advise, thanks.

## 2018-08-30 NOTE — Telephone Encounter (Signed)
Take one am, one bid

## 2018-09-02 ENCOUNTER — Encounter: Payer: Self-pay | Admitting: Obstetrics and Gynecology

## 2018-09-02 ENCOUNTER — Ambulatory Visit (INDEPENDENT_AMBULATORY_CARE_PROVIDER_SITE_OTHER): Payer: 59 | Admitting: Obstetrics and Gynecology

## 2018-09-02 VITALS — BP 128/102 | HR 98 | Ht 65.0 in | Wt 169.0 lb

## 2018-09-02 DIAGNOSIS — I10 Essential (primary) hypertension: Secondary | ICD-10-CM | POA: Diagnosis not present

## 2018-09-02 DIAGNOSIS — N926 Irregular menstruation, unspecified: Secondary | ICD-10-CM

## 2018-09-02 MED ORDER — NIFEDIPINE ER 90 MG PO TB24: 90.0000 mg | ORAL_TABLET | Freq: Every day | ORAL | 2 refills | Status: DC

## 2018-09-02 NOTE — Progress Notes (Signed)
Here for BP check at 10w 2 d EGA. Has increased procardia to 60mg  for 3 days with slight decrease in BP. Feels really tired but otherwise no issues.   Blood pressure (!) 128/102, pulse 98, height 5\' 5"  (1.651 m), weight 169 lb (76.7 kg), last menstrual period 06/23/2018.  Plan: will increase Procardia XR to 90mg  daily and return to recheck BP her in 5 days. She will also see Dr Marcelline Mates for new OB PE as she is high risk. States clear understanding but tearful as she only wants to see me due to the fact that I cared for her with first pregnancy and she feels like I was the only one that listened to her when she had renal stones after last pregnancy. I reassured her that she will get the same care from Dr Marcelline Mates and we may consider returning to my care if BP is stabilized moving forward.    Breezy Hertenstein,CNM

## 2018-09-06 ENCOUNTER — Telehealth: Payer: Self-pay | Admitting: Obstetrics and Gynecology

## 2018-09-06 NOTE — Telephone Encounter (Signed)
The patient called and stated that she needs to speak with her nurse or Dr. Marcelline Mates as soon aspossible in regards to her not being able to take her medication due to the side effects she having. The patient is hoping to speak with someone some time this morning. Please advise.

## 2018-09-06 NOTE — Telephone Encounter (Signed)
Pt called to see how she was doing. Pt stated that she think she is having side effects from her blood pressure medication. Pt was advised to stop taking the medication until [facility] was aware. Pt stated that she was having problems sleeping, migraines (headache), weak, dizzy, and increase heart rate.

## 2018-09-07 ENCOUNTER — Ambulatory Visit (INDEPENDENT_AMBULATORY_CARE_PROVIDER_SITE_OTHER): Payer: 59 | Admitting: Obstetrics and Gynecology

## 2018-09-07 ENCOUNTER — Other Ambulatory Visit: Payer: Self-pay

## 2018-09-07 VITALS — BP 131/90 | HR 85 | Ht 65.0 in | Wt 169.8 lb

## 2018-09-07 DIAGNOSIS — O10911 Unspecified pre-existing hypertension complicating pregnancy, first trimester: Secondary | ICD-10-CM

## 2018-09-07 MED ORDER — NIFEDIPINE ER OSMOTIC RELEASE 30 MG PO TB24: 30.0000 mg | ORAL_TABLET | Freq: Every day | ORAL | 2 refills | Status: DC

## 2018-09-07 NOTE — Progress Notes (Signed)
Patient comes in today for a BP check. Patients BP was taken in left arm and was 131/90. Patient stated that she stopped taking the Nifedipine on Monday night. She started having migraines and flu like symptoms while on the medication. Patient states that since she stopped taking the medication that her symptoms have went away.

## 2018-09-07 NOTE — Telephone Encounter (Signed)
Spoke with pt and AC changed the pt's blood pressure medication from Adalat CC 90 mg to Procardia 30 mg once daily. Pt is aware of the changes and will be picking up her medication from the pharmacy.

## 2018-09-07 NOTE — Telephone Encounter (Signed)
Pt called to inform her that [facility] had changed her blood pressure medication to Procardia xl 30mg . Pt is aware of the changes and that medication was sent to her pharmacy.

## 2018-09-10 ENCOUNTER — Encounter: Payer: Self-pay | Admitting: Obstetrics and Gynecology

## 2018-09-13 ENCOUNTER — Ambulatory Visit (INDEPENDENT_AMBULATORY_CARE_PROVIDER_SITE_OTHER): Payer: 59 | Admitting: Obstetrics and Gynecology

## 2018-09-13 VITALS — BP 144/100 | HR 93 | Wt 170.7 lb

## 2018-09-13 DIAGNOSIS — Z3A11 11 weeks gestation of pregnancy: Secondary | ICD-10-CM

## 2018-09-13 DIAGNOSIS — O99281 Endocrine, nutritional and metabolic diseases complicating pregnancy, first trimester: Secondary | ICD-10-CM

## 2018-09-13 DIAGNOSIS — O10911 Unspecified pre-existing hypertension complicating pregnancy, first trimester: Secondary | ICD-10-CM

## 2018-09-13 DIAGNOSIS — E041 Nontoxic single thyroid nodule: Secondary | ICD-10-CM

## 2018-09-13 DIAGNOSIS — Z3481 Encounter for supervision of other normal pregnancy, first trimester: Secondary | ICD-10-CM

## 2018-09-13 DIAGNOSIS — O9989 Other specified diseases and conditions complicating pregnancy, childbirth and the puerperium: Secondary | ICD-10-CM

## 2018-09-13 DIAGNOSIS — G44021 Chronic cluster headache, intractable: Secondary | ICD-10-CM

## 2018-09-13 DIAGNOSIS — E663 Overweight: Secondary | ICD-10-CM

## 2018-09-13 DIAGNOSIS — O10919 Unspecified pre-existing hypertension complicating pregnancy, unspecified trimester: Secondary | ICD-10-CM

## 2018-09-13 DIAGNOSIS — Z87442 Personal history of urinary calculi: Secondary | ICD-10-CM

## 2018-09-13 LAB — POCT URINALYSIS DIPSTICK OB
BILIRUBIN UA: NEGATIVE
Blood, UA: NEGATIVE
GLUCOSE, UA: NEGATIVE
Ketones, UA: NEGATIVE
Leukocytes, UA: NEGATIVE
Nitrite, UA: NEGATIVE
POC,PROTEIN,UA: NEGATIVE
SPEC GRAV UA: 1.025 (ref 1.010–1.025)
UROBILINOGEN UA: 0.2 U/dL
pH, UA: 6 (ref 5.0–8.0)

## 2018-09-13 MED ORDER — POLYETHYLENE GLYCOL 3350 17 G PO PACK: 17.0000 g | PACK | Freq: Every day | ORAL | 0 refills | Status: DC

## 2018-09-13 MED ORDER — DOCUSATE SODIUM 100 MG PO CAPS: 100.0000 mg | ORAL_CAPSULE | Freq: Two times a day (BID) | ORAL | 2 refills | Status: DC | PRN

## 2018-09-13 MED ORDER — METHYLDOPA 250 MG PO TABS: 250.0000 mg | ORAL_TABLET | Freq: Three times a day (TID) | ORAL | 1 refills | Status: DC

## 2018-09-13 NOTE — Progress Notes (Signed)
OBSTETRIC INITIAL PRENATAL VISIT  Subjective:    Sonya Bauer is being seen today for her first obstetrical visit.  This is a planned pregnancy. She is a 27 y.o. G2P1001 female at [redacted]w[redacted]d gestation, Estimated Date of Delivery: 03/30/19 with last menstrual period was 06/23/2018 consistent with 8 week sono. Her obstetrical history is significant for chronic hypertension. Relationship with FOB: spouse, living together. Patient does intend to breast feed. Pregnancy history fully reviewed.  Of note, patient has had several visits since her confirmation appointment for management of BPs. Patient states that when she found out she was pregnant, she discontinued her home BP meds  (cannot recall the names of the medications).  Was originally seen by Lorelle Gibbs, CNM for her confirmation and was stared on Labetalol, however patient was noting side effects of dizziness, headaches, nausea.  She was then changed to nifedipine 90 mg but was also noting side effects. Was advised to decrease to 30 mg but still noting side effects (however not as mild, mostly just mild persistent headaches). She states that each time she stops the medications, her symptoms resolve. She believes that she is starting to have a depressed mood as she is becoming frustrated with her BPs not being able to be controlled.   Additionally she notes that she has a history of chronic neck pain, headaches, and pain behind her eyes. Has been seen by many doctors (including PCP, Neurologist) but has never found a diagnosis. Has tried medications like Fioricet, NSAIDs, which offer mild relief. Is seeing a chiropractor which helps some.    OB History  Gravida Para Term Preterm AB Living  2 1 1  0 0 1  SAB TAB Ectopic Multiple Live Births  0 0 0 0 1    # Outcome Date GA Lbr Len/2nd Weight Sex Delivery Anes PTL Lv  2 Current           1 Term 07/02/15 [redacted]w[redacted]d 05:50 / 00:14 6 lb 6.7 oz (2.91 kg) F Vag-Spont EPI  LIV     Name: KING,GIRL  Cleona     Apgar1: 8  Apgar5: 9    Gynecologic History:  Last pap smear was 11/11/2016.  Results were normal.  Denies h/o abnormal pap smears in the past.  Denies history of STIs.   Past Medical History:  Diagnosis Date  . Complication of anesthesia   . Depression   . Family history of adverse reaction to anesthesia    MOM-HAD SEIZURE DURING SURGERY   . GERD (gastroesophageal reflux disease)    EVERY OTHER DAY  . Headache   . Heart murmur    ASYMPTOMATIC  . Heartburn   . History of kidney stones   . Hypertension   . Kidney stones   . Motion sickness    long trips  . Pregnancy induced hypertension   . Thyroid cyst 11/16/14     Family History  Problem Relation Age of Onset  . Hypertension Father   . Asthma Mother   . Kidney disease Neg Hx   . Bladder Cancer Neg Hx      Past Surgical History:  Procedure Laterality Date  . CYSTOSCOPY WITH STENT PLACEMENT Left 10/06/2016   Procedure: CYSTOSCOPY WITH STENT PLACEMENT;  Surgeon: Alexis Frock, MD;  Location: [facility];  Service: Urology;  Laterality: Left;  . TONSILLECTOMY AND ADENOIDECTOMY N/A 05/20/2018   Procedure: TONSILLECTOMY;  Surgeon: Beverly Gust, MD;  Location: [place];  Service: ENT;  Laterality: N/A;  . URETEROSCOPY WITH  HOLMIUM LASER LITHOTRIPSY Left 10/06/2016   Procedure: URETEROSCOPY WITH HOLMIUM LASER LITHOTRIPSY;  Surgeon: Alexis Frock, MD;  Location: [facility];  Service: Urology;  Laterality: Left;  . WISDOM TOOTH EXTRACTION     AGE 57     Social History   Socioeconomic History  . Marital status: Married    Spouse name: Not on file  . Number of children: Not on file  . Years of education: Not on file  . Highest education level: Not on file  Occupational History  . Not on file  Social Needs  . Financial resource strain: Not on file  . Food insecurity:    Worry: Not on file    Inability: Not on file  . Transportation needs:    Medical: Not on file    Non-medical: Not on file    Tobacco Use  . Smoking status: Never Smoker  . Smokeless tobacco: Never Used  Substance and Sexual Activity  . Alcohol use: Yes    Comment: may have 2-3 drinks 2x/month  . Drug use: No  . Sexual activity: Yes  Lifestyle  . Physical activity:    Days per week: Not on file    Minutes per session: Not on file  . Stress: Not on file  Relationships  . Social connections:    Talks on phone: Not on file    Gets together: Not on file    Attends religious service: Not on file    Active member of club or organization: Not on file    Attends meetings of clubs or organizations: Not on file    Relationship status: Not on file  . Intimate partner violence:    Fear of current or ex partner: Not on file    Emotionally abused: Not on file    Physically abused: Not on file    Forced sexual activity: Not on file  Other Topics Concern  . Not on file  Social History Narrative  . Not on file     Current Outpatient Medications on File Prior to Visit  Medication Sig Dispense Refill  . acetaminophen (TYLENOL) 500 MG tablet Take by mouth.    Marland Kitchen acyclovir (ZOVIRAX) 200 MG capsule Take by mouth.    . ondansetron (ZOFRAN ODT) 4 MG disintegrating tablet Take 1 tablet (4 mg total) by mouth every 8 (eight) hours as needed for nausea or vomiting. 30 tablet 3  . promethazine (PHENERGAN) 25 MG tablet Take 1 tablet (25 mg total) by mouth every 6 (six) hours as needed for nausea or vomiting. 30 tablet 1  . valACYclovir (VALTREX) 500 MG tablet as needed.   0  . Butalbital-APAP-Caffeine 50-300-40 MG CAPS TK 1 C PO Q 6 H PRF HA  0  . ibuprofen (ADVIL,MOTRIN) 600 MG tablet Take 1 tablet (600 mg total) by mouth every 6 (six) hours as needed. (Patient not taking: Reported on 05/11/2018) 30 tablet 0  . NIFEdipine (PROCARDIA XL) 30 MG 24 hr tablet Take 1 tablet (30 mg total) by mouth daily. (Patient not taking: Reported on 09/13/2018) 30 tablet 2   No current facility-administered medications on file prior to visit.       Allergies  Allergen Reactions  . Azithromycin Nausea And Vomiting     Review of Systems General:Not Present- Fever, Weight Loss and Weight Gain. Skin:Not Present- Rash. HEENT:Not Present- Blurred Vision, Headache and Bleeding Gums. Respiratory:Not Present- Difficulty Breathing. Breast:Not Present- Breast Mass. Cardiovascular:Not Present- Chest Pain, Elevated Blood Pressure, Fainting / Blacking Out and  Shortness of Breath. Gastrointestinal:Not Present- Abdominal Pain, Constipation, Nausea and Vomiting. Female Genitourinary:Not Present- Frequency, Painful Urination, Pelvic Pain, Vaginal Bleeding, Vaginal Discharge, Contractions, regular, Fetal Movements Decreased, Urinary Complaints and Vaginal Fluid. Musculoskeletal:Not Present-  Leg Cramps.  Present - Back Pain (notes it feels like another kidney infection) Neurological:Not Present- Dizziness. Psychiatric:Not Present- Depression.     Objective:   Blood pressure (!) 144/100, pulse 93, weight 170 lb 11.2 oz (77.4 kg), last menstrual period 06/23/2018.  Body mass index is 28.41 kg/m.  General Appearance:    Alert, cooperative, no distress, appears stated age, overweight.   Head:    Normocephalic, without obvious abnormality, atraumatic  Eyes:    PERRL, conjunctiva/corneas clear, EOM's intact, both eyes  Ears:    Normal external ear canals, both ears  Nose:   Nares normal, septum midline, mucosa normal, no drainage or sinus tenderness  Throat:   Lips, mucosa, and tongue normal; teeth and gums normal  Neck:   Supple, symmetrical, trachea midline, no adenopathy; thyroid: no enlargement/tenderness/nodules; no carotid bruit or JVD  Back:     Symmetric, no curvature, ROM normal, no CVA tenderness.  Mild tenderness to palpation in right lower back.   Lungs:     Clear to auscultation bilaterally, respirations unlabored  Chest Wall:    No tenderness or deformity   Heart:    Regular rate and rhythm, S1 and S2 normal, no  murmur, rub or gallop  Breast Exam:    No tenderness, masses, or nipple abnormality  Abdomen:     Soft, non-tender, bowel sounds active all four quadrants, no masses, no organomegaly.  FH 11.  FHT 158 bpm.  Genitalia:    Pelvic:external genitalia normal, vagina without lesions, discharge, or tenderness, rectovaginal septum  normal. Cervix normal in appearance, no cervical motion tenderness, no adnexal masses or tenderness.  Pregnancy positive findings: uterine enlargement: 11 wk size, nontender.   Rectal:    Normal external sphincter.  No hemorrhoids appreciated. Internal exam not done.   Extremities:   Extremities normal, atraumatic, no cyanosis or edema  Pulses:   2+ and symmetric all extremities  Skin:   Skin color, texture, turgor normal, no rashes or lesions  Lymph nodes:   Cervical, supraclavicular, and axillary nodes normal  Neurologic:   CNII-XII intact, normal strength, sensation and reflexes throughout      Assessment:    Pregnancy at 11 and 5/7 weeks   Chronic HTN History of kidney stones Chronic headaches Thyroid cyst Overweight  Plan:   - Initial labs reviewed. - Prenatal vitamins encouraged. - Problem list reviewed and updated. - New OB counseling:  The patient has been given an overview regarding routine prenatal care.  Recommendations regarding diet, weight gain, and exercise in pregnancy were given. Prenatal testing, optional genetic testing, and ultrasound use in pregnancy were reviewed. First trimester, second trimester, and cell-free DNA testing discussed.  Patient desires Panorama testing.  Will order. - Benefits of Breast Feeding were discussed. The patient is encouraged to consider nursing her baby post partum. - Will check baseline thyroid levels as patient has a history of thyroid cyst, has not had labs in ~ 2 years.  - CHTN, has tried Labetalol and now Aldomet with side effects. Discussed option of continuing Aldomet as it has had less side effects with  reduced dosing and adding a different medication (HCTZ which she believes she was on in the past) vs trying a completely different medication (I.e. Aldomet).  Patient desires to try Aldomet. Started  on 250 mg BID.  Discussed that she will still likely need an additional medication for control during her pregnancy. She notes understanding. Will get baseline labs. Notes she has had labs drawn by PCP in the last 2 months for her HTN. Will get records from [facility], or will need to order new labs next visit.  - Chronic headaches, advised that she can increase Fioricet to 2 tablet dosing as needed. Headaches may have also increased in frequency due to sudden drop in BPs from meds.  - H/o kidney stones, patient with back pain (possibly flank pain).  Notes pain is mild right now, can be managed with Tylenol.  Instructed to f/u if pain worsens. UA today negative.   Follow up in 1 week for BP check, and in 4 weeks for routine OB visit.  >50% of 45 min visit spent on counseling and coordination of care.     Rubie Maid, MD [facility]

## 2018-09-13 NOTE — Progress Notes (Signed)
ROB-Pt is present today for NOB PE pt stated that she is having back pain that she think is related to Kidney issues.

## 2018-09-14 LAB — THYROID PANEL WITH TSH
Free Thyroxine Index: 1.5 (ref 1.2–4.9)
T3 Uptake Ratio: 16 % — ABNORMAL LOW (ref 24–39)
T4, Total: 9.6 ug/dL (ref 4.5–12.0)
TSH: 1.16 u[IU]/mL (ref 0.450–4.500)

## 2018-09-15 ENCOUNTER — Encounter: Payer: Self-pay | Admitting: Obstetrics and Gynecology

## 2018-09-20 ENCOUNTER — Encounter: Payer: Self-pay | Admitting: Obstetrics and Gynecology

## 2018-09-20 ENCOUNTER — Ambulatory Visit (INDEPENDENT_AMBULATORY_CARE_PROVIDER_SITE_OTHER): Payer: 59 | Admitting: Obstetrics and Gynecology

## 2018-09-20 VITALS — BP 126/92

## 2018-09-20 DIAGNOSIS — O10919 Unspecified pre-existing hypertension complicating pregnancy, unspecified trimester: Secondary | ICD-10-CM

## 2018-09-20 DIAGNOSIS — R109 Unspecified abdominal pain: Secondary | ICD-10-CM

## 2018-09-20 DIAGNOSIS — O26899 Other specified pregnancy related conditions, unspecified trimester: Secondary | ICD-10-CM

## 2018-09-20 NOTE — Progress Notes (Signed)
I have reviewed the record.  Patient doing well on new BP med. BPs better controlled with no side effects. Patient still noting flank pain (however is still tolerable). To continue current measures.   Rubie Maid, MD [facility]

## 2018-09-20 NOTE — Progress Notes (Signed)
Pt is here for a NV for BP check per [facility]. Pt states she is having no symptoms with new med-Methyldopa 250mg  TID. She also wants Dr Marcelline Mates to be aware she is still having flank pain due to kidney stone.  Vitals:   09/20/18 1628  BP: (!) 126/92

## 2018-09-22 ENCOUNTER — Telehealth: Payer: Self-pay

## 2018-09-22 NOTE — Telephone Encounter (Signed)
Called pt no answer LM via voicemail to call the office to go over her test results.

## 2018-09-22 NOTE — Telephone Encounter (Signed)
Pt returned call to go over genetic testing. Pt was informed that she would be having a healthy baby without any genetic issues. Pt did not want to know the sex of the baby. Copy of the test results were placed in a envelope and placed at the front desk for pt to pick up.

## 2018-09-23 ENCOUNTER — Telehealth: Payer: Self-pay | Admitting: Obstetrics and Gynecology

## 2018-09-23 NOTE — Telephone Encounter (Signed)
Pt called the office stating that she needed more refills of Zofran. Pt was called and informed that M.Shambley ordered her Zofran on 08/23/18 with 3 refills. Pt stated that she would call the pharmacy to check on the medication.

## 2018-09-23 NOTE — Telephone Encounter (Signed)
Patient called requesting a refill on Zofran sent to [facility] on [address] in [place]. Thanks

## 2018-09-30 NOTE — Telephone Encounter (Signed)
Pt stated that she did not call the pharmacy due to her trying to not use the Zofran as much. Pt stated that she has been trying other things to help with her morning sickness which she stated that decreasing now.

## 2018-10-06 ENCOUNTER — Encounter: Payer: Self-pay | Admitting: Emergency Medicine

## 2018-10-06 ENCOUNTER — Emergency Department
Admission: EM | Admit: 2018-10-06 | Discharge: 2018-10-06 | Disposition: A | Discharge status: Home / Self Care | Payer: 59 | Attending: Emergency Medicine | Admitting: Emergency Medicine

## 2018-10-06 ENCOUNTER — Other Ambulatory Visit: Payer: Self-pay

## 2018-10-06 ENCOUNTER — Emergency Department: Payer: 59

## 2018-10-06 DIAGNOSIS — I10 Essential (primary) hypertension: Secondary | ICD-10-CM

## 2018-10-06 DIAGNOSIS — Z79899 Other long term (current) drug therapy: Secondary | ICD-10-CM | POA: Diagnosis not present

## 2018-10-06 DIAGNOSIS — Z3A15 15 weeks gestation of pregnancy: Secondary | ICD-10-CM | POA: Insufficient documentation

## 2018-10-06 DIAGNOSIS — R519 Headache, unspecified: Secondary | ICD-10-CM

## 2018-10-06 DIAGNOSIS — R51 Headache: Secondary | ICD-10-CM

## 2018-10-06 DIAGNOSIS — O2342 Unspecified infection of urinary tract in pregnancy, second trimester: Secondary | ICD-10-CM | POA: Diagnosis not present

## 2018-10-06 DIAGNOSIS — O10012 Pre-existing essential hypertension complicating pregnancy, second trimester: Secondary | ICD-10-CM | POA: Insufficient documentation

## 2018-10-06 DIAGNOSIS — O9989 Other specified diseases and conditions complicating pregnancy, childbirth and the puerperium: Secondary | ICD-10-CM | POA: Diagnosis present

## 2018-10-06 DIAGNOSIS — N390 Urinary tract infection, site not specified: Secondary | ICD-10-CM

## 2018-10-06 LAB — URINALYSIS, COMPLETE (UACMP) WITH MICROSCOPIC
Bilirubin Urine: NEGATIVE
Glucose, UA: NEGATIVE mg/dL
Hgb urine dipstick: NEGATIVE
Ketones, ur: NEGATIVE mg/dL
Leukocytes, UA: NEGATIVE
Nitrite: NEGATIVE
PH: 5 (ref 5.0–8.0)
Protein, ur: NEGATIVE mg/dL
SPECIFIC GRAVITY, URINE: 1.025 (ref 1.005–1.030)

## 2018-10-06 LAB — COMPREHENSIVE METABOLIC PANEL
ALT: 17 U/L (ref 0–44)
AST: 16 U/L (ref 15–41)
Albumin: 3.7 g/dL (ref 3.5–5.0)
Alkaline Phosphatase: 60 U/L (ref 38–126)
Anion gap: 7 (ref 5–15)
BUN: 7 mg/dL (ref 6–20)
CALCIUM: 9.1 mg/dL (ref 8.9–10.3)
CO2: 23 mmol/L (ref 22–32)
Chloride: 105 mmol/L (ref 98–111)
Creatinine, Ser: 0.46 mg/dL (ref 0.44–1.00)
GFR calc Af Amer: >60 mL/min (ref >60)
GFR calc non Af Amer: >60 mL/min (ref >60)
Glucose, Bld: 90 mg/dL (ref 70–99)
Potassium: 3.4 mmol/L — ABNORMAL LOW (ref 3.5–5.1)
Sodium: 135 mmol/L (ref 135–145)
Total Bilirubin: 0.4 mg/dL (ref 0.3–1.2)
Total Protein: 6.6 g/dL (ref 6.5–8.1)

## 2018-10-06 LAB — CBC
HEMATOCRIT: 36.1 % (ref 36.0–46.0)
Hemoglobin: 12.4 g/dL (ref 12.0–15.0)
MCH: 28.6 pg (ref 26.0–34.0)
MCHC: 34.3 g/dL (ref 30.0–36.0)
MCV: 83.2 fL (ref 80.0–100.0)
Platelets: 267 10*3/uL (ref 150–400)
RBC: 4.34 MIL/uL (ref 3.87–5.11)
RDW: 12.7 % (ref 11.5–15.5)
WBC: 6.7 10*3/uL (ref 4.0–10.5)
nRBC: 0 % (ref 0.0–0.2)

## 2018-10-06 MED ORDER — ACETAMINOPHEN 500 MG PO TABS
1000.0000 mg | ORAL_TABLET | Freq: Once | ORAL | Status: AC
  Administered 2018-10-06: 1000 mg via ORAL
  Filled 2018-10-06: qty 2

## 2018-10-06 MED ORDER — DIPHENHYDRAMINE HCL 50 MG/ML IJ SOLN
50.0000 mg | Freq: Once | INTRAMUSCULAR | Status: AC
  Administered 2018-10-06: 50 mg via INTRAVENOUS
  Filled 2018-10-06: qty 1

## 2018-10-06 MED ORDER — SODIUM CHLORIDE 0.9 % IV BOLUS
1000.0000 mL | Freq: Once | INTRAVENOUS | Status: AC
  Administered 2018-10-06: 1000 mL via INTRAVENOUS

## 2018-10-06 MED ORDER — NITROFURANTOIN MONOHYD MACRO 100 MG PO CAPS: 100.0000 mg | ORAL_CAPSULE | Freq: Two times a day (BID) | ORAL | 0 refills | Status: AC

## 2018-10-06 MED ORDER — METOCLOPRAMIDE HCL 5 MG/ML IJ SOLN
10.0000 mg | Freq: Once | INTRAMUSCULAR | Status: AC
  Administered 2018-10-06: 10 mg via INTRAVENOUS
  Filled 2018-10-06: qty 2

## 2018-10-06 NOTE — Discharge Instructions (Signed)
Please take your antibiotics as prescribed for their entire course.  Please follow-up with your OB/GYN as soon as possible for recheck/reevaluation.  Return to the emergency department for any worsening pain, development of fever, or any other symptom personally concerning to yourself.

## 2018-10-06 NOTE — ED Triage Notes (Signed)
States she is [redacted] weeks pregnant and has history high blood pressure. C/O headache to posterior head and neck x 1 day.  AAOx3.  Skin warm and dry. NAD

## 2018-10-06 NOTE — ED Provider Notes (Addendum)
[facility] Emergency Department Provider Note  Time seen: 2:13 PM  I have reviewed the triage vital signs and the nursing notes.   HISTORY  Chief Complaint Headache    HPI Sonya Bauer is a 27 y.o. female with a past medical history of depression, gastric reflux, hypertension, approximate [redacted] weeks pregnant presents to the emergency department for a headache and high blood pressure.  According to the patient she has a history of hypertension, her doctor recently adjusted her medications as she is pregnant.  States a history of hypertension with her prior pregnancy as well but denies ever being diagnosed with preeclampsia.  Patient states over the past 2 days she has been experiencing a headache, states a history of intermittent headaches but because of blood pressure was elevated and she was having headache she wanted to go see her OB for evaluation.  Patient's OB office was closed so she came to the emergency department for evaluation.  Patient states a moderate headache, dull and global.  Denies any weakness or numbness.  Patient does have mild right flank pain on review of systems states this is been ongoing for 1 month but has a history of kidney stones does not know if she has a kidney stone that is stuck per patient.  Denies any dysuria or hematuria.  Denies any vaginal bleeding or discharge.   Past Medical History:  Diagnosis Date  . Complication of anesthesia   . Depression   . Family history of adverse reaction to anesthesia    MOM-HAD SEIZURE DURING SURGERY   . GERD (gastroesophageal reflux disease)    EVERY OTHER DAY  . Headache   . Heart murmur    ASYMPTOMATIC  . Heartburn   . History of kidney stones   . Hypertension   . Kidney stones   . Motion sickness    long trips  . Pregnancy induced hypertension   . 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 Cervical radiculopathy 12/16/2017  . 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 Multinodular goiter 11/16/2014    Past Surgical History:  Procedure Laterality Date  . CYSTOSCOPY WITH STENT PLACEMENT Left 10/06/2016   Procedure: CYSTOSCOPY WITH STENT PLACEMENT;  Surgeon: Alexis Frock, MD;  Location: [facility];  Service: Urology;  Laterality: Left;  . TONSILLECTOMY AND ADENOIDECTOMY N/A 05/20/2018   Procedure: TONSILLECTOMY;  Surgeon: Beverly Gust, MD;  Location: [place];  Service: ENT;  Laterality: N/A;  . URETEROSCOPY WITH HOLMIUM LASER LITHOTRIPSY Left 10/06/2016   Procedure: URETEROSCOPY WITH HOLMIUM LASER LITHOTRIPSY;  Surgeon: Alexis Frock, MD;  Location: [facility];  Service: Urology;  Laterality: Left;  . WISDOM TOOTH EXTRACTION     AGE 41    Prior to Admission medications   Medication Sig Start Date End Date Taking? Authorizing Provider  acetaminophen (TYLENOL) 500 MG tablet Take by mouth.    [provider]  acyclovir (ZOVIRAX) 200 MG capsule Take by mouth.    [provider]  Butalbital-APAP-Caffeine 50-300-40 MG CAPS TK 1 C PO Q 6 H PRF HA 02/15/18   [provider]  docusate sodium (COLACE) 100 MG capsule Take 1 capsule (100 mg total) by mouth 2 (two) times daily as needed. 09/13/18   Rubie Maid, MD  ibuprofen (ADVIL,MOTRIN) 600 MG tablet Take 1 tablet (600 mg total) by mouth every 6 (six) hours as needed. Patient not taking: Reported on 05/11/2018 09/21/17   Melynda Ripple, MD  methyldopa (ALDOMET) 250 MG tablet Take 1 tablet (250 mg total) by mouth 3 (three) times daily. 09/13/18   Rubie Maid, MD  NIFEdipine (PROCARDIA XL) 30 MG 24 hr tablet Take 1 tablet (30 mg total) by mouth daily. Patient not taking: Reported on 09/13/2018 09/07/18   Rubie Maid, MD  ondansetron (ZOFRAN ODT) 4 MG disintegrating tablet Take 1 tablet (4 mg total) by mouth every 8 (eight) hours as needed for nausea or vomiting. 08/23/18   Shambley, Melody N, CNM  polyethylene glycol (MIRALAX) packet Take 17 g by mouth daily.  09/13/18   Rubie Maid, MD  promethazine (PHENERGAN) 25 MG tablet Take 1 tablet (25 mg total) by mouth every 6 (six) hours as needed for nausea or vomiting. 08/23/18   Shambley, Melody N, CNM  valACYclovir (VALTREX) 500 MG tablet as needed.  03/11/18   [provider]    Allergies  Allergen Reactions  . Azithromycin Nausea And Vomiting    Family History  Problem Relation Age of Onset  . Hypertension Father   . Asthma Mother   . Kidney disease Neg Hx   . Bladder Cancer Neg Hx     Social History Social History   Tobacco Use  . Smoking status: Never Smoker  . Smokeless tobacco: Never Used  Substance Use Topics  . Alcohol use: Yes    Comment: may have 2-3 drinks 2x/month  . Drug use: No    Review of Systems Constitutional: Negative for fever. Cardiovascular: Negative for chest pain. Respiratory: Negative for shortness of breath. Gastrointestinal: Mild right flank pain. Genitourinary: Negative for urinary compaints Musculoskeletal: Negative for musculoskeletal complaints Skin: Negative for skin complaints  Neurological: Moderate headache. All other ROS negative  ____________________________________________   PHYSICAL EXAM:  VITAL SIGNS: ED Triage Vitals  Enc Vitals Group     BP 10/06/18 1143 (!) 141/89     Pulse Rate 10/06/18 1143 80     Resp 10/06/18 1143 16     Temp 10/06/18 1143 98.3 F (36.8 C)     Temp Source 10/06/18 1143 Oral     SpO2 10/06/18 1143 98 %     Weight 10/06/18 1144 170 lb (77.1 kg)     Height 10/06/18 1144 5\' 1"  (1.549 m)     Head Circumference --      Peak Flow --      Pain Score 10/06/18 1142 8     Pain Loc --      Pain Edu? --      Excl. in [place]? --    Constitutional: Alert and oriented. Well appearing and in no distress. Eyes: Normal exam ENT   Head: Normocephalic and atraumatic.   Mouth/Throat: Mucous membranes are moist. Cardiovascular: Normal rate, regular rhythm. No murmur Respiratory: Normal respiratory effort  without tachypnea nor retractions. Breath sounds are clear Gastrointestinal: Soft and nontender. No distention.  Musculoskeletal: Nontender with normal range of motion in all extremities.  Neurologic:  Normal speech and language. No gross focal neurologic deficits Skin:  Skin is warm, dry and intact.  Psychiatric: Mood and affect are normal.  ____________________________________________   RADIOLOGY  Ultrasound negative  ____________________________________________   INITIAL IMPRESSION / ASSESSMENT AND PLAN / ED COURSE  Pertinent labs & imaging results that were available during my care of the patient were reviewed by me and considered in my medical decision making (see chart for details).  Patient presents to the emergency department for headache, high blood pressure, found of mild right flank pain on review  of systems x1 month.  Differential would include hypertension, unlikely preeclampsia given age of gestation.  We will check labs, treat the patient's headache with Tylenol, Reglan and Benadryl.  We will obtain a renal ultrasound of the right flank to help rule out kidney stone.  We will check a urine to ensure that there is no urinary tract infection or pyelonephritis.  We will monitor the patient's blood pressure as we are able to control her headache.  Patient states her blood pressure today 141/89 is actually really good for her.  Labs are largely within normal limits besides a mild urinary tract infection.  We will cover with Macrobid as a precaution.  Urine culture has been sent.  Ultrasound is reassuring. ____________________________________________   FINAL CLINICAL IMPRESSION(S) / ED DIAGNOSES  Hypertension of pregnancy Right flank pain Urinary tract infection   Harvest Dark, MD 10/06/18 1458    Harvest Dark, MD 10/06/18 1507    Harvest Dark, MD 10/06/18 [phone]

## 2018-10-07 ENCOUNTER — Telehealth: Payer: Self-pay | Admitting: Obstetrics and Gynecology

## 2018-10-07 MED ORDER — BUTALBITAL-APAP-CAFFEINE 50-325-40 MG PO CAPS: 1.0000 | ORAL_CAPSULE | Freq: Four times a day (QID) | ORAL | 3 refills | Status: DC | PRN

## 2018-10-07 NOTE — Telephone Encounter (Signed)
Please let patient know that I have sent her migraine medication to her pharmacy.   Rubie Maid, MD [facility]

## 2018-10-07 NOTE — Telephone Encounter (Signed)
Please advise on refill.

## 2018-10-07 NOTE — Telephone Encounter (Signed)
Notified patient that prescription is at the pharmacy.

## 2018-10-07 NOTE — Telephone Encounter (Signed)
The patient called and LVM on Dec 25 @ 9:20 AM, asking for a refill of her migraine meds ASAP, please advise, thanks.

## 2018-10-08 LAB — URINE CULTURE: Culture: 70000 — AB

## 2018-10-12 NOTE — L&D Delivery Note (Signed)
Delivery Summary for Sonya Bauer  Labor Events:   Preterm labor:   Rupture date: 03/21/2019  Rupture time: 1:02 PM  Rupture type: Spontaneous Intact  Fluid Color:   Induction:   Augmentation:   Complications:   Cervical ripening:          Delivery:   Episiotomy:   Lacerations:   Repair suture:   Repair # of packets:   Blood loss (ml): 350   Information for the patient's newborn:  Sonya Bauer, Sonya Bauer [[id number]]    Delivery 03/21/2019 2:39 PM by  Vaginal, Spontaneous Sex:  female Gestational Age: [redacted]w[redacted]d Delivery Clinician:   Living?:         APGARS  One minute Five minutes Ten minutes  Skin color:        Heart rate:        Grimace:        Muscle tone:        Breathing:        Totals: 9  9      Presentation/position:      Resuscitation:   Cord information:    Disposition of cord blood:     Blood gases sent?  Complications:   Placenta: Delivered:       appearance Newborn Measurements: Weight: 7 lb 0.2 oz (3180 g)  Height: 18.9"  Head circumference:    Chest circumference:    Other providers:    Additional  information: Forceps:   Vacuum:   Breech:   Observed anomalies       Delivery Note At 2:39 PM a viable and healthy female was delivered via Vaginal, Spontaneous (Presentation: Vertex; LOA position).  APGAR: 9, 9; weight  3180 grams.   Placenta status: spontaneously removed, intact.  Cord: 3-vessel, with the following complications: none.  Cord pH: not obtained.  Delayed cord blood clamping observed.   Anesthesia: IV Stadol, Epidural  Episiotomy: None Lacerations: None Suture Repair: none Est. Blood Loss (mL): 350  Mom to postpartum.  Baby to Couplet care / Skin to Skin.  Sonya Bauer 03/21/2019, 2:57 PM

## 2018-10-14 ENCOUNTER — Ambulatory Visit (INDEPENDENT_AMBULATORY_CARE_PROVIDER_SITE_OTHER): Payer: 59 | Admitting: Obstetrics and Gynecology

## 2018-10-14 ENCOUNTER — Encounter: Payer: Self-pay | Admitting: Obstetrics and Gynecology

## 2018-10-14 VITALS — BP 124/83 | HR 76 | Wt 170.9 lb

## 2018-10-14 DIAGNOSIS — Z3481 Encounter for supervision of other normal pregnancy, first trimester: Secondary | ICD-10-CM

## 2018-10-14 NOTE — Progress Notes (Signed)
Patient comes in today for her OB visit. Patient states that she has no problems or concerns today. Patient declines flu shot today. Patient declines AFP testing.

## 2018-10-14 NOTE — Addendum Note (Signed)
Addended by: Durwin Glaze on: 10/14/2018 10:23 AM   Modules accepted: Orders

## 2018-10-14 NOTE — Progress Notes (Signed)
ROB: Patient still complaining of occasional nausea and vomiting in the evening.  Taking Zofran.  Desires AFP, declines flu shot.  FAS next visit.  Taking Aldomet as directed blood pressure is good.

## 2018-10-16 LAB — AFP, SERUM, OPEN SPINA BIFIDA
AFP MoM: 0.84
AFP Value: 25.8 ng/mL
Gest. Age on Collection Date: 16 weeks
Maternal Age At EDD: 27.6 yr
OSBR Risk 1 IN: 10000
Test Results:: NEGATIVE
Weight: 170 [lb_av]

## 2018-11-07 NOTE — Addendum Note (Signed)
Addended by: Finis Bud on: 11/07/2018 09:48 AM   Modules accepted: Orders

## 2018-11-07 NOTE — Addendum Note (Signed)
Addended by: Finis Bud on: 11/07/2018 09:56 AM   Modules accepted: Orders

## 2018-11-09 ENCOUNTER — Encounter: Payer: 59 | Admitting: Obstetrics and Gynecology

## 2018-11-09 ENCOUNTER — Ambulatory Visit (INDEPENDENT_AMBULATORY_CARE_PROVIDER_SITE_OTHER): Payer: 59

## 2018-11-09 DIAGNOSIS — Z363 Encounter for antenatal screening for malformations: Secondary | ICD-10-CM | POA: Diagnosis not present

## 2018-11-09 DIAGNOSIS — Z3481 Encounter for supervision of other normal pregnancy, first trimester: Secondary | ICD-10-CM

## 2018-11-14 ENCOUNTER — Ambulatory Visit (INDEPENDENT_AMBULATORY_CARE_PROVIDER_SITE_OTHER): Payer: 59 | Admitting: Obstetrics and Gynecology

## 2018-11-14 VITALS — BP 136/96 | HR 92 | Ht 61.0 in | Wt 175.5 lb

## 2018-11-14 DIAGNOSIS — K219 Gastro-esophageal reflux disease without esophagitis: Secondary | ICD-10-CM

## 2018-11-14 DIAGNOSIS — O10912 Unspecified pre-existing hypertension complicating pregnancy, second trimester: Secondary | ICD-10-CM

## 2018-11-14 DIAGNOSIS — O0992 Supervision of high risk pregnancy, unspecified, second trimester: Secondary | ICD-10-CM

## 2018-11-14 DIAGNOSIS — O99619 Diseases of the digestive system complicating pregnancy, unspecified trimester: Secondary | ICD-10-CM

## 2018-11-14 DIAGNOSIS — J3489 Other specified disorders of nose and nasal sinuses: Secondary | ICD-10-CM | POA: Insufficient documentation

## 2018-11-14 DIAGNOSIS — O99612 Diseases of the digestive system complicating pregnancy, second trimester: Secondary | ICD-10-CM

## 2018-11-14 DIAGNOSIS — O10919 Unspecified pre-existing hypertension complicating pregnancy, unspecified trimester: Secondary | ICD-10-CM

## 2018-11-14 LAB — POCT URINALYSIS DIPSTICK OB
Bilirubin, UA: NEGATIVE
Blood, UA: NEGATIVE
Glucose, UA: NEGATIVE
Ketones, UA: NEGATIVE
Leukocytes, UA: NEGATIVE
NITRITE UA: NEGATIVE
Urobilinogen, UA: 0.2 E.U./dL
pH, UA: 6 (ref 5.0–8.0)

## 2018-11-14 MED ORDER — LORATADINE 10 MG PO TABS: 10.0000 mg | ORAL_TABLET | Freq: Every day | ORAL | 4 refills | Status: DC

## 2018-11-14 MED ORDER — PANTOPRAZOLE SODIUM 20 MG PO TBEC: 20.0000 mg | DELAYED_RELEASE_TABLET | Freq: Two times a day (BID) | ORAL | 2 refills | Status: DC

## 2018-11-14 NOTE — Progress Notes (Signed)
ROB: Patient noting reflux symptoms not relieved with Tums.  Prescribed Zantac.  Also noting sinus pressure symptoms, has been trying OTC meds and Afrin spray with modest relief. Denies nasal drainage. Advised to add Zyrtec. Also noting still not being regular, although bowel movements are soft with stool softeners. Recommend Miralax, also increase fiber intake. Had normal anatomy scan last week. RTC in 4 weeks.

## 2018-11-14 NOTE — Progress Notes (Signed)
ROB-Pt stated that she is doing well. No complaints.

## 2018-11-15 ENCOUNTER — Other Ambulatory Visit: Payer: 59

## 2018-11-15 ENCOUNTER — Encounter: Payer: 59 | Admitting: Obstetrics and Gynecology

## 2018-11-15 NOTE — Telephone Encounter (Signed)
ERROR

## 2018-11-22 ENCOUNTER — Other Ambulatory Visit: Payer: Self-pay | Admitting: Obstetrics and Gynecology

## 2018-11-22 ENCOUNTER — Ambulatory Visit (INDEPENDENT_AMBULATORY_CARE_PROVIDER_SITE_OTHER): Payer: 59

## 2018-11-22 DIAGNOSIS — Z3492 Encounter for supervision of normal pregnancy, unspecified, second trimester: Secondary | ICD-10-CM

## 2018-11-22 DIAGNOSIS — Z362 Encounter for other antenatal screening follow-up: Secondary | ICD-10-CM | POA: Diagnosis not present

## 2018-11-22 DIAGNOSIS — Z3402 Encounter for supervision of normal first pregnancy, second trimester: Secondary | ICD-10-CM

## 2018-12-09 ENCOUNTER — Telehealth: Payer: Self-pay | Admitting: Obstetrics and Gynecology

## 2018-12-09 NOTE — Telephone Encounter (Signed)
The patient called and asked to move her appointment so she can see only Dr. Marcelline Mates.  It was explained to her that she needs to see Dr. 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 Amalia Hailey.  She stated on last visit that it was due to a remark about how she feels about vaccines.  *NO FURTHER ACTION REQUIRED AT THIS TIME*

## 2018-12-13 ENCOUNTER — Ambulatory Visit (INDEPENDENT_AMBULATORY_CARE_PROVIDER_SITE_OTHER): Payer: 59 | Admitting: Obstetrics and Gynecology

## 2018-12-13 ENCOUNTER — Encounter: Payer: Self-pay | Admitting: Obstetrics and Gynecology

## 2018-12-13 VITALS — BP 117/82 | HR 97 | Ht 65.0 in | Wt 181.0 lb

## 2018-12-13 DIAGNOSIS — Z3402 Encounter for supervision of normal first pregnancy, second trimester: Secondary | ICD-10-CM

## 2018-12-13 LAB — POCT URINALYSIS DIPSTICK OB
BILIRUBIN UA: NEGATIVE
Blood, UA: NEGATIVE
GLUCOSE, UA: NEGATIVE
Ketones, UA: NEGATIVE
Leukocytes, UA: NEGATIVE
Nitrite, UA: NEGATIVE
Spec Grav, UA: 1.01 (ref 1.010–1.025)
Urobilinogen, UA: 0.2 E.U./dL
pH, UA: 6.5 (ref 5.0–8.0)

## 2018-12-13 NOTE — Progress Notes (Signed)
Patient comes in today for [place] visit. She states that she is having painful gas.

## 2018-12-13 NOTE — Progress Notes (Signed)
ROB: No complaints.  Reports active daily movement.  1 hour GCT with next visit.

## 2018-12-14 ENCOUNTER — Other Ambulatory Visit: Payer: Self-pay | Admitting: Obstetrics and Gynecology

## 2019-01-09 ENCOUNTER — Telehealth: Payer: Self-pay

## 2019-01-09 NOTE — Telephone Encounter (Signed)
Coronavirus (COVID-19) Are you at risk?  Are you at risk for the Coronavirus (COVID-19)?  To be considered HIGH RISK for Coronavirus (COVID-19), you have to meet the following criteria:  . Traveled to [place], [place], [place], [place] or [place]; or in the [place] to [place], [place], [place], or [place]; and have fever, cough, and shortness of breath within the last 2 weeks of travel OR . Been in close contact with a person diagnosed with COVID-19 within the last 2 weeks and have fever, cough, and shortness of breath . IF YOU DO NOT MEET THESE CRITERIA, YOU ARE CONSIDERED LOW RISK FOR COVID-19.  What to do if you are HIGH RISK for COVID-19?  . If you are having a medical emergency, call 911. . Seek medical care right away. Before you go to a doctor's office, urgent care or emergency department, call ahead and tell them about your recent travel, contact with someone diagnosed with COVID-19, and your symptoms. You should receive instructions from your physician's office regarding next steps of care.  . When you arrive at healthcare provider, tell the healthcare staff immediately you have returned from visiting [place], [place], [place], [place] or [place]; or traveled in the [place] to [place], [place], [place], or [place]; in the last two weeks or you have been in close contact with a person diagnosed with COVID-19 in the last 2 weeks.   . Tell the health care staff about your symptoms: fever, cough and shortness of breath. . After you have been seen by a medical provider, you will be either: o Tested for (COVID-19) and discharged home on quarantine except to seek medical care if symptoms worsen, and asked to  - Stay home and avoid contact with others until you get your results (4-5 days)  - Avoid travel on public transportation if possible (such as bus, train, or airplane) or o Sent to the Emergency Department by EMS for evaluation, COVID-19 testing, and possible  admission depending on your condition and test results.  What to do if you are LOW RISK for COVID-19?  Reduce your risk of any infection by using the same precautions used for avoiding the common cold or flu:  . Wash your hands often with soap and warm water for at least 20 seconds.  If soap and water are not readily available, use an alcohol-based hand sanitizer with at least 60% alcohol.  . If coughing or sneezing, cover your mouth and nose by coughing or sneezing into the elbow areas of your shirt or coat, into a tissue or into your sleeve (not your hands). . Avoid shaking hands with others and consider head nods or verbal greetings only. . Avoid touching your eyes, nose, or mouth with unwashed hands.  . Avoid close contact with people who are sick. . Avoid places or events with large numbers of people in one location, like concerts or sporting events. . Carefully consider travel plans you have or are making. . If you are planning any travel outside or inside the [place], visit the CDC's Travelers' Health webpage for the latest health notices. . If you have some symptoms but not all symptoms, continue to monitor at home and seek medical attention if your symptoms worsen. . If you are having a medical emergency, call 911.   ADDITIONAL HEALTHCARE OPTIONS FOR PATIENTS  [facility] / e-Visit: [email or web address]         [facility]: [phone]  [place]   Urgent Care: [facility]: [phone]  Prescreened covid- neg. cm

## 2019-01-10 ENCOUNTER — Other Ambulatory Visit: Payer: Self-pay

## 2019-01-10 ENCOUNTER — Encounter: Payer: 59 | Admitting: Obstetrics and Gynecology

## 2019-01-10 ENCOUNTER — Other Ambulatory Visit (INDEPENDENT_AMBULATORY_CARE_PROVIDER_SITE_OTHER): Payer: 59

## 2019-01-10 DIAGNOSIS — Z3402 Encounter for supervision of normal first pregnancy, second trimester: Secondary | ICD-10-CM

## 2019-01-10 DIAGNOSIS — R300 Dysuria: Secondary | ICD-10-CM | POA: Diagnosis not present

## 2019-01-10 LAB — POCT URINALYSIS DIPSTICK OB
Bilirubin, UA: NEGATIVE
Blood, UA: NEGATIVE
Glucose, UA: NEGATIVE
Ketones, UA: NEGATIVE
Leukocytes, UA: NEGATIVE
Nitrite, UA: NEGATIVE
POC,PROTEIN,UA: NEGATIVE
Spec Grav, UA: 1.015 (ref 1.010–1.025)
Urobilinogen, UA: 0.2 E.U./dL
pH, UA: 6.5 (ref 5.0–8.0)

## 2019-01-11 LAB — CBC
HEMATOCRIT: 30.4 % — AB (ref 34.0–46.6)
Hemoglobin: 11 g/dL — ABNORMAL LOW (ref 11.1–15.9)
MCH: 30.5 pg (ref 26.6–33.0)
MCHC: 36.2 g/dL — ABNORMAL HIGH (ref 31.5–35.7)
MCV: 84 fL (ref 79–97)
Platelets: 208 10*3/uL (ref 150–450)
RBC: 3.61 x10E6/uL — ABNORMAL LOW (ref 3.77–5.28)
RDW: 13 % (ref 11.7–15.4)
WBC: 7.3 10*3/uL (ref 3.4–10.8)

## 2019-01-11 LAB — RPR: RPR Ser Ql: NONREACTIVE

## 2019-01-11 LAB — GLUCOSE, 1 HOUR GESTATIONAL: Gestational Diabetes Screen: 165 mg/dL — ABNORMAL HIGH (ref 65–139)

## 2019-01-12 ENCOUNTER — Telehealth: Payer: Self-pay | Admitting: Obstetrics and Gynecology

## 2019-01-12 NOTE — Telephone Encounter (Signed)
The patient called and stated that she is checking the status of her results. Please advise.

## 2019-01-13 NOTE — Telephone Encounter (Signed)
The patient called again to check the status of her results. Please advise.

## 2019-01-13 NOTE — Telephone Encounter (Signed)
Pt called to go over her test results after [facility] reviewed them. Pt scheduled an appointment for her 3 hour GT today.

## 2019-01-16 ENCOUNTER — Other Ambulatory Visit: Payer: 59

## 2019-01-16 ENCOUNTER — Other Ambulatory Visit: Payer: Self-pay

## 2019-01-16 ENCOUNTER — Telehealth: Payer: Self-pay | Admitting: Surgical

## 2019-01-16 DIAGNOSIS — Z3402 Encounter for supervision of normal first pregnancy, second trimester: Secondary | ICD-10-CM

## 2019-01-16 NOTE — Telephone Encounter (Signed)
Patients office manager Roderic Ovens at [facility] office called today to get more information on the letter that was sent out for patient. Per Dr. Amalia Hailey patient should not have direct patient care such as being in the patients mouth. They ask if we was putting her out of work and per Dr. Amalia Hailey we are not. 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 Roderic Ovens thanked me and we disconnected the phone.

## 2019-01-17 LAB — GESTATIONAL GLUCOSE TOLERANCE
Glucose, Fasting: 79 mg/dL (ref 65–94)
Glucose, GTT - 1 Hour: 197 mg/dL — ABNORMAL HIGH (ref 65–179)
Glucose, GTT - 2 Hour: 163 mg/dL — ABNORMAL HIGH (ref 65–154)
Glucose, GTT - 3 Hour: 63 mg/dL — ABNORMAL LOW (ref 65–139)

## 2019-01-18 ENCOUNTER — Other Ambulatory Visit: Payer: Self-pay | Admitting: Surgical

## 2019-01-18 DIAGNOSIS — O24419 Gestational diabetes mellitus in pregnancy, unspecified control: Secondary | ICD-10-CM

## 2019-01-20 ENCOUNTER — Encounter: Payer: Self-pay | Admitting: *Deleted

## 2019-01-20 ENCOUNTER — Encounter: Payer: 59 | Attending: Obstetrics and Gynecology | Admitting: *Deleted

## 2019-01-20 ENCOUNTER — Other Ambulatory Visit: Payer: Self-pay

## 2019-01-20 VITALS — BP 108/80 | Ht 65.0 in | Wt 188.2 lb

## 2019-01-20 DIAGNOSIS — Z713 Dietary counseling and surveillance: Secondary | ICD-10-CM | POA: Insufficient documentation

## 2019-01-20 DIAGNOSIS — O24419 Gestational diabetes mellitus in pregnancy, unspecified control: Secondary | ICD-10-CM | POA: Diagnosis not present

## 2019-01-20 DIAGNOSIS — O24410 Gestational diabetes mellitus in pregnancy, diet controlled: Secondary | ICD-10-CM

## 2019-01-20 NOTE — Progress Notes (Signed)
Diabetes Self-Management Education  Visit Type: First/Initial  Appt. Start Time: 0915 Appt. End Time: 1050  01/20/2019  Ms. Sonya Bauer, identified by name and date of birth, is a 28 y.o. female with a diagnosis of Diabetes: Gestational Diabetes.   ASSESSMENT  Blood pressure 108/80, height 5\' 5"  (1.651 m), weight 188 lb 3.2 oz (85.4 kg), last menstrual period 06/23/2018. Body mass index is 31.32 kg/m.  Diabetes Self-Management Education - 01/20/19 1108      Visit Information   Visit Type  First/Initial      Initial Visit   Diabetes Type  Gestational Diabetes    Are you currently following a meal plan?  No    Are you taking your medications as prescribed?  Yes    Date Diagnosed  1 week ago      Health Coping   How would you rate your overall health?  Fair      Psychosocial Assessment   Patient Belief/Attitude about Diabetes  Other (comment)   "disappointed"   Self-care barriers  None    Self-management support  Doctor's office;Family    Patient Concerns  Nutrition/Meal planning;Glycemic Control;Monitoring;Weight Control;Healthy Lifestyle    Special Needs  None    Preferred Learning Style  Visual;Auditory    Learning Readiness  Change in progress    How often do you need to have someone help you when you read instructions, pamphlets, or other written materials from your doctor or pharmacy?  1 - Never    What is the last grade level you completed in school?  Associates      Pre-Education Assessment   Patient understands the diabetes disease and treatment process.  Needs Instruction    Patient understands incorporating nutritional management into lifestyle.  Needs Instruction    Patient undertands incorporating physical activity into lifestyle.  Needs Instruction    Patient understands using medications safely.  Needs Instruction    Patient understands monitoring blood glucose, interpreting and using results  Needs Instruction    Patient understands prevention,  detection, and treatment of acute complications.  Needs Instruction    Patient understands prevention, detection, and treatment of chronic complications.  Needs Instruction    Patient understands how to develop strategies to address psychosocial issues.  Needs Instruction    Patient understands how to develop strategies to promote health/change behavior.  Needs Instruction      Complications   How often do you check your blood sugar?  0 times/day (not testing)   Provided One Touch Verio Flex meter and instructed on use. BG upon return demonstration was 86 mg/dL at 10:40 am - fasting.    Have you had a dilated eye exam in the past 12 months?  No    Have you had a dental exam in the past 12 months?  Yes    Are you checking your feet?  No      Dietary Intake   Breakfast  cold cereal and milk; fruit    Snack (morning)  granola bar    Lunch  meat or tuna or peanut butter sandwich    Snack (afternoon)  granola bar    Dinner  beef, pork, chicken - bread, potatoes, peas, beans, corn, rice and pasta 1 x week, green beans, salads and most non-starchy vegetables    Beverage(s)  water, soda or tea sweetened daily      Exercise   Exercise Type  ADL's      Patient Education   Previous Diabetes Education  No  Disease state   Definition of diabetes, type 1 and 2, and the diagnosis of diabetes;Factors that contribute to the development of diabetes    Nutrition management   Role of diet in the treatment of diabetes and the relationship between the three main macronutrients and blood glucose level;Food label reading, portion sizes and measuring food.;Reviewed blood glucose goals for pre and post meals and how to evaluate the patients' food intake on their blood glucose level.    Physical activity and exercise   Role of exercise on diabetes management, blood pressure control and cardiac health.    Monitoring  Taught/evaluated SMBG meter.;Purpose and frequency of SMBG.;Taught/discussed recording of test  results and interpretation of SMBG.;Ketone testing, when, how.    Chronic complications  Relationship between chronic complications and blood glucose control    Psychosocial adjustment  Identified and addressed patients feelings and concerns about diabetes    Preconception care  Pregnancy and GDM  Role of pre-pregnancy blood glucose control on the development of the fetus;Role of family planning for patients with diabetes;Reviewed with patient blood glucose goals with pregnancy      Individualized Goals (developed by patient)   Reducing Risk Improve blood sugars Prevent diabetes complications Lose weight Lead a healthier lifestyle Become more fit     Outcomes   Expected Outcomes  Demonstrated interest in learning. Expect positive outcomes       Individualized Plan for Diabetes Self-Management Training:   Learning Objective:  Patient will have a greater understanding of diabetes self-management. Patient education plan is to attend individual and/or group sessions per assessed needs and concerns.   Plan:   Patient Instructions  Read booklet on Gestational Diabetes Follow Gestational Meal Planning Guidelines Avoid cold cereal and fruit for breakfast Avoid sugar-sweetened drinks (tea, soda) Complete a 3 Day Food Record and bring to next appointment Check blood sugars 4 x day - before breakfast and 2 hrs after every meal and record  Bring blood sugar log to all appointments Call MD for prescription for meter strips and lancets Strips  One Touch Verio Lancets   One Touch Delica Purchase urine ketone strips if blood sugars not controlled and check urine ketones every am:  If + increase bedtime snack to 1 protein and 2 carbohydrate servings Walk 20-30 minutes at least 5 x week if permitted by MD  Expected Outcomes:  Demonstrated interest in learning. Expect positive outcomes  Education material provided:  Gestational Booklet Gestational Meal Planning Guidelines Simple Meal  Plan Viewed Gestational Diabetes Video Meter = One Touch Verio Flex 3 Day Food Record Goals for a Healthy Pregnancy  If problems or questions, patient to contact team via:  Johny Drilling, [place], [place], CDE [phone]  Future DSME appointment:  January 25, 2019 with the dietitian

## 2019-01-20 NOTE — Patient Instructions (Signed)
Read booklet on Gestational Diabetes Follow Gestational Meal Planning Guidelines Avoid cold cereal and fruit for breakfast Avoid sugar-sweetened drinks (tea, soda) Complete a 3 Day Food Record and bring to next appointment Check blood sugars 4 x day - before breakfast and 2 hrs after every meal and record  Bring blood sugar log to all appointments Call MD for prescription for meter strips and lancets Strips  One Touch Verio Lancets   One Touch Delica Purchase urine ketone strips if blood sugars not controlled and check urine ketones every am:  If + increase bedtime snack to 1 protein and 2 carbohydrate servings Walk 20-30 minutes at least 5 x week if permitted by MD

## 2019-01-23 ENCOUNTER — Other Ambulatory Visit: Payer: Self-pay | Admitting: *Deleted

## 2019-01-23 ENCOUNTER — Telehealth: Payer: Self-pay | Admitting: Obstetrics and Gynecology

## 2019-01-23 MED ORDER — GLUCOSE BLOOD VI STRP: ORAL_STRIP | 12 refills | Status: DC

## 2019-01-23 NOTE — Telephone Encounter (Signed)
Patient called requesting a refill on one touch vario test strips sent to [facility] on [address]. Thanks

## 2019-01-23 NOTE — Telephone Encounter (Signed)
Done-ac 

## 2019-01-25 ENCOUNTER — Encounter: Payer: Self-pay | Admitting: Dietician

## 2019-01-25 ENCOUNTER — Telehealth: Payer: Self-pay | Admitting: Obstetrics and Gynecology

## 2019-01-25 ENCOUNTER — Encounter: Payer: 59 | Admitting: Dietician

## 2019-01-25 ENCOUNTER — Other Ambulatory Visit: Payer: Self-pay

## 2019-01-25 VITALS — BP 120/82 | Ht 65.0 in | Wt 189.6 lb

## 2019-01-25 DIAGNOSIS — O24410 Gestational diabetes mellitus in pregnancy, diet controlled: Secondary | ICD-10-CM

## 2019-01-25 DIAGNOSIS — Z713 Dietary counseling and surveillance: Secondary | ICD-10-CM | POA: Diagnosis not present

## 2019-01-25 NOTE — Telephone Encounter (Signed)
Pt called no answer LM and related the message given by [facility]. Pt was advised that if she felt like she needed to come into the office before her scheduled appointment on Friday, April 17th to please call the office in the morning and schedule an appointment with Dr. Amalia Hailey.

## 2019-01-25 NOTE — Patient Instructions (Signed)
   Choose low fat and low fiber foods.   Eat small meals and snacks, keeping to no more than 45grams carb at a time. Less and more often is ok.

## 2019-01-25 NOTE — Progress Notes (Signed)
   Patient's BG record indicates BGs are generally within goal ranges, with fasting BGs ranging 84-106 (101, 106 in past 2 days after not sleeping well due to abdominal pain), and post-meal BGs ranging 69-122 (122 after pizza + extra bread)  Patient's food diary indicates balanced meals, adequate protein intake, and generally controlled carb amounts.   Patient reports upper abdominal pain over the past week, always worst after eating or drinking.    Provided 1900kcal meal plan, and wrote individualized menus based on patient's food preferences. Encouraged patient to eat small meals and snacks frequently and choose low fat, low-moderate fiber foods in effort to ease abdominal pain.   Instructed patient on food safety, including avoidance of Listeriosis, and limiting mercury from fish.  Discussed importance of maintaining healthy lifestyle habits to reduce risk of Type 2 DM as well as Gestational DM with any future pregnancies.  Advised patient to use any remaining testing supplies to test some BGs after delivery, and to have BG tested ideally annually, as well as prior to attempting future pregnancies.

## 2019-01-25 NOTE — Telephone Encounter (Signed)
Pt was called back to speak to her concerning her call to the office and the pain she is having from abd area. Pt stated that she noticed it more after she eats. Pt stated that she taking medication for acid reflux, but she do not think its that. Pt stated that she noticed the pain in the middle upper area of her stomach. Pt stated that area is tender to the touch. Pt stated that she has been woken up several times during the night due to the pain. Pt stated that she hit the area on the countertop and it hurt so bad that she fell to her knees. Pt stated that even eating a salad causes the pain. Please advise. Sonya Bauer

## 2019-01-25 NOTE — Telephone Encounter (Signed)
How long has she had the pain? How long ago did she hit her stomach? Has she tried anything to help with the pain (since she does not think it is related to reflux), such as Tylenol or using a warm compress?  Has she had any fevers, or any nausea/vomiting? If symptoms are mild and she hasn't tried anything, then advise on Tylenol/warm compress if it sounds like muscle pain.  Otherwise, if she feels she needs to be seen (symptoms pretty bad or nausea/vomiting), we can schedule her and attempt to rule out other causes such as pancreatitis/gallstones.   Dr. Marcelline Mates

## 2019-01-25 NOTE — Telephone Encounter (Signed)
The patient called wanting to speak with a nurse in regards to her having upper abdominal pain. The patient did not disclose any other information/Pt has apt on Friday. Please advise.

## 2019-01-27 ENCOUNTER — Encounter: Payer: Self-pay | Admitting: Obstetrics and Gynecology

## 2019-01-27 ENCOUNTER — Ambulatory Visit (INDEPENDENT_AMBULATORY_CARE_PROVIDER_SITE_OTHER): Payer: 59 | Admitting: Obstetrics and Gynecology

## 2019-01-27 ENCOUNTER — Other Ambulatory Visit: Payer: Self-pay

## 2019-01-27 VITALS — BP 131/88 | HR 93 | Wt 189.6 lb

## 2019-01-27 DIAGNOSIS — O0993 Supervision of high risk pregnancy, unspecified, third trimester: Secondary | ICD-10-CM

## 2019-01-27 DIAGNOSIS — O10913 Unspecified pre-existing hypertension complicating pregnancy, third trimester: Secondary | ICD-10-CM

## 2019-01-27 DIAGNOSIS — O9989 Other specified diseases and conditions complicating pregnancy, childbirth and the puerperium: Secondary | ICD-10-CM

## 2019-01-27 DIAGNOSIS — R1010 Upper abdominal pain, unspecified: Secondary | ICD-10-CM

## 2019-01-27 DIAGNOSIS — O24410 Gestational diabetes mellitus in pregnancy, diet controlled: Secondary | ICD-10-CM | POA: Insufficient documentation

## 2019-01-27 DIAGNOSIS — O10919 Unspecified pre-existing hypertension complicating pregnancy, unspecified trimester: Secondary | ICD-10-CM

## 2019-01-27 LAB — POCT URINALYSIS DIPSTICK OB
Bilirubin, UA: NEGATIVE
Blood, UA: NEGATIVE
Glucose, UA: NEGATIVE
Ketones, UA: NEGATIVE
Leukocytes, UA: NEGATIVE
Nitrite, UA: NEGATIVE
POC,PROTEIN,UA: NEGATIVE
Spec Grav, UA: 1.01 (ref 1.010–1.025)
Urobilinogen, UA: 0.2 E.U./dL
pH, UA: 7.5 (ref 5.0–8.0)

## 2019-01-27 NOTE — Progress Notes (Signed)
ROB: Patient notes upper abdominal pain over 1 week, intermittent, sometimes worsened with eating, 1 episode of vomiting. Wakes her up out of her sleep. Feels dull, achy. Will get RUQ ultrasound to r/o gallstones. GDM well controlled on diet.  Discussed need for antenatal testing and serial growth ultrasounds for cHTN. RTC in 1 week for scan and NST, 2 weeks for OB appt.

## 2019-01-27 NOTE — Progress Notes (Signed)
ROB-Pt present today for prenatal care. Pt stated that she is having abd pain, no vaginal bleeding, no contractions, fetal movement present, and her hands have been swelling.

## 2019-01-30 ENCOUNTER — Telehealth: Payer: Self-pay

## 2019-01-30 NOTE — Telephone Encounter (Signed)
Coronavirus (COVID-19) Are you at risk?  Are you at risk for the Coronavirus (COVID-19)?  To be considered HIGH RISK for Coronavirus (COVID-19), you have to meet the following criteria:  . Traveled to [place], [place], [place], [place] or [place]; or in the [place] to [place], [place], [place], or [place]; and have fever, cough, and shortness of breath within the last 2 weeks of travel OR . Been in close contact with a person diagnosed with COVID-19 within the last 2 weeks and have fever, cough, and shortness of breath . IF YOU DO NOT MEET THESE CRITERIA, YOU ARE CONSIDERED LOW RISK FOR COVID-19.  What to do if you are HIGH RISK for COVID-19?  . If you are having a medical emergency, call 911. . Seek medical care right away. Before you go to a doctor's office, urgent care or emergency department, call ahead and tell them about your recent travel, contact with someone diagnosed with COVID-19, and your symptoms. You should receive instructions from your physician's office regarding next steps of care.  . When you arrive at healthcare provider, tell the healthcare staff immediately you have returned from visiting [place], [place], [place], [place] or [place]; or traveled in the [place] to [place], [place], [place], or [place]; in the last two weeks or you have been in close contact with a person diagnosed with COVID-19 in the last 2 weeks.   . Tell the health care staff about your symptoms: fever, cough and shortness of breath. . After you have been seen by a medical provider, you will be either: o Tested for (COVID-19) and discharged home on quarantine except to seek medical care if symptoms worsen, and asked to  - Stay home and avoid contact with others until you get your results (4-5 days)  - Avoid travel on public transportation if possible (such as bus, train, or airplane) or o Sent to the Emergency Department by EMS for evaluation, COVID-19 testing, and possible  admission depending on your condition and test results.  What to do if you are LOW RISK for COVID-19?  Reduce your risk of any infection by using the same precautions used for avoiding the common cold or flu:  . Wash your hands often with soap and warm water for at least 20 seconds.  If soap and water are not readily available, use an alcohol-based hand sanitizer with at least 60% alcohol.  . If coughing or sneezing, cover your mouth and nose by coughing or sneezing into the elbow areas of your shirt or coat, into a tissue or into your sleeve (not your hands). . Avoid shaking hands with others and consider head nods or verbal greetings only. . Avoid touching your eyes, nose, or mouth with unwashed hands.  . Avoid close contact with people who are sick. . Avoid places or events with large numbers of people in one location, like concerts or sporting events. . Carefully consider travel plans you have or are making. . If you are planning any travel outside or inside the [place], visit the CDC's Travelers' Health webpage for the latest health notices. . If you have some symptoms but not all symptoms, continue to monitor at home and seek medical attention if your symptoms worsen. . If you are having a medical emergency, call 911.   ADDITIONAL HEALTHCARE OPTIONS FOR PATIENTS  [facility] / e-Visit: [email or web address]         [facility]: [phone]  [place]   Urgent Care: [facility]: [phone]   Prescreened- neg. cm

## 2019-01-31 ENCOUNTER — Other Ambulatory Visit: Payer: Self-pay

## 2019-01-31 ENCOUNTER — Ambulatory Visit (INDEPENDENT_AMBULATORY_CARE_PROVIDER_SITE_OTHER): Payer: 59

## 2019-01-31 DIAGNOSIS — O10919 Unspecified pre-existing hypertension complicating pregnancy, unspecified trimester: Secondary | ICD-10-CM

## 2019-01-31 DIAGNOSIS — R1010 Upper abdominal pain, unspecified: Secondary | ICD-10-CM

## 2019-02-06 ENCOUNTER — Telehealth: Payer: Self-pay | Admitting: Obstetrics and Gynecology

## 2019-02-06 ENCOUNTER — Telehealth: Payer: Self-pay

## 2019-02-06 NOTE — Telephone Encounter (Signed)
-----   Message from Rubie Maid, MD sent at 02/06/2019 11:08 AM EDT ----- Regarding: RE: test results Please let her know that her ultrasound was normal. No evidence of kidney stones or gallstones. And the growth scan of the baby is on target, baby is at the 41st percentile which is very good.  Dr. Marcelline Mates ----- Message ----- From: Edwyna Shell, LPN Sent: 4/32/7614  10:23 AM EDT To: Rubie Maid, MD Subject: test results                                   Pt called this morning wanting her test results of her u/s of her kidneys. I see the results are in but will you please explain it to me. Thanks [employer]

## 2019-02-06 NOTE — Telephone Encounter (Signed)
Patient called requesting results from gallbladder u/s. Thanks

## 2019-02-06 NOTE — Telephone Encounter (Signed)
Pt called no answer LM to call the office to go over her test results.

## 2019-02-06 NOTE — Telephone Encounter (Signed)
Pt called and informed that her results were really but had to reviewed by one of the doctors before the results could be explained. Pt stated that she understood.

## 2019-02-17 ENCOUNTER — Other Ambulatory Visit: Payer: Self-pay

## 2019-02-17 ENCOUNTER — Encounter: Payer: Self-pay | Admitting: Obstetrics and Gynecology

## 2019-02-17 ENCOUNTER — Ambulatory Visit: Payer: 59 | Admitting: Obstetrics and Gynecology

## 2019-02-17 VITALS — BP 123/84 | HR 101 | Ht 65.0 in | Wt 192.3 lb

## 2019-02-17 DIAGNOSIS — O24410 Gestational diabetes mellitus in pregnancy, diet controlled: Secondary | ICD-10-CM

## 2019-02-17 DIAGNOSIS — O0993 Supervision of high risk pregnancy, unspecified, third trimester: Secondary | ICD-10-CM | POA: Diagnosis not present

## 2019-02-17 DIAGNOSIS — O10919 Unspecified pre-existing hypertension complicating pregnancy, unspecified trimester: Secondary | ICD-10-CM

## 2019-02-17 LAB — POCT URINALYSIS DIPSTICK OB
Bilirubin, UA: NEGATIVE
Blood, UA: NEGATIVE
Glucose, UA: NEGATIVE
Ketones, UA: NEGATIVE
Leukocytes, UA: NEGATIVE
Nitrite, UA: NEGATIVE
Spec Grav, UA: 1.01 (ref 1.010–1.025)
Urobilinogen, UA: 0.2 E.U./dL
pH, UA: 7.5 (ref 5.0–8.0)

## 2019-02-17 NOTE — Progress Notes (Signed)
Patient comes in today for [place] visit. She has been some swelling in hands and feet  X 1 week. At night she is having pain in her shoulder blades.

## 2019-02-17 NOTE — Progress Notes (Signed)
ROB: Patient denies any problems.  Reports active daily fetal movement.  Hypertension controlled.  Begin NSTs at 36 weeks.

## 2019-02-28 ENCOUNTER — Other Ambulatory Visit: Payer: Self-pay

## 2019-02-28 ENCOUNTER — Encounter: Payer: Self-pay | Admitting: Obstetrics and Gynecology

## 2019-02-28 ENCOUNTER — Ambulatory Visit (INDEPENDENT_AMBULATORY_CARE_PROVIDER_SITE_OTHER): Payer: 59 | Admitting: Obstetrics and Gynecology

## 2019-02-28 VITALS — BP 140/94 | HR 91 | Wt 195.7 lb

## 2019-02-28 DIAGNOSIS — O10919 Unspecified pre-existing hypertension complicating pregnancy, unspecified trimester: Secondary | ICD-10-CM

## 2019-02-28 DIAGNOSIS — R399 Unspecified symptoms and signs involving the genitourinary system: Secondary | ICD-10-CM

## 2019-02-28 DIAGNOSIS — O0993 Supervision of high risk pregnancy, unspecified, third trimester: Secondary | ICD-10-CM

## 2019-02-28 LAB — POCT URINALYSIS DIPSTICK OB
Bilirubin, UA: NEGATIVE
Glucose, UA: NEGATIVE
Ketones, UA: NEGATIVE
Leukocytes, UA: NEGATIVE
Nitrite, UA: NEGATIVE
Spec Grav, UA: >=1.03 — AB (ref 1.010–1.025)
Urobilinogen, UA: 0.2 E.U./dL
pH, UA: 6 (ref 5.0–8.0)

## 2019-02-28 LAB — OB RESULTS CONSOLE GC/CHLAMYDIA: Gonorrhea: NEGATIVE

## 2019-02-28 NOTE — Progress Notes (Signed)
ROB-Pt present today due to elevated BP. Pt called this morning stating that her BP levels have been elevated since last night pt's BP in office was 140/94. Pt stated having swollen, feet,toes and hands. Pt stated having lower abd pain/back pain that feels like cycle cramps since last Friday.

## 2019-02-28 NOTE — Progress Notes (Signed)
Problem OB: Patient presents with complaints of worsening elevated blood pressures since last night. She has a h/o CHTN in pregnancy on Aldomet and GDM (diet).  She also c/o feet and hand swelling over the past several days, along with blurred vision, pelvic cramping (unsure if she is contracting). Denies RUQ pain, vaginal bleeding.  She does note good blood sugars normally, however has not checked her blood sugars over the past day due to nausea and 1 episode of vomiting. Will get [place] labs to rule out superimposed pre-eclampsia. Discussed hand braces and compression stockings. Encouraged adequate hydration as tolerated.  If labs negative, will increase Aldomet. Can consider IOL for CHTN between 37-39 weeks unless otherwise indicated. Will also send culture for small blood noted on UA and r/o UTI. NST performed today was reviewed and was found to be reactive.  Continue recommended antenatal testing and prenatal care. Will schedule for growth scan next week with visit.    NONSTRESS TEST INTERPRETATION  INDICATIONS: Rule out contractions and Chronic hypertension  FHR baseline: 145 bpm RESULTS:Reactive COMMENTS: Mild uterine irritability noted.    PLAN: 1. Continue fetal kick counts twice a day. 2. Continue antepartum testing as scheduled-Biweekly

## 2019-03-01 LAB — COMPREHENSIVE METABOLIC PANEL
ALT: 8 IU/L (ref 0–32)
AST: 9 IU/L (ref 0–40)
Albumin/Globulin Ratio: 1.9 (ref 1.2–2.2)
Albumin: 3.7 g/dL — ABNORMAL LOW (ref 3.9–5.0)
Alkaline Phosphatase: 119 IU/L — ABNORMAL HIGH (ref 39–117)
BUN/Creatinine Ratio: 19 (ref 9–23)
BUN: 8 mg/dL (ref 6–20)
Bilirubin Total: <0.2 mg/dL (ref 0.0–1.2)
CO2: 17 mmol/L — ABNORMAL LOW (ref 20–29)
Calcium: 9.6 mg/dL (ref 8.7–10.2)
Chloride: 107 mmol/L — ABNORMAL HIGH (ref 96–106)
Creatinine, Ser: 0.43 mg/dL — ABNORMAL LOW (ref 0.57–1.00)
GFR calc Af Amer: 161 mL/min/{1.73_m2} (ref >59)
GFR calc non Af Amer: 140 mL/min/{1.73_m2} (ref >59)
Globulin, Total: 1.9 g/dL (ref 1.5–4.5)
Glucose: 98 mg/dL (ref 65–99)
Potassium: 3.5 mmol/L (ref 3.5–5.2)
Sodium: 139 mmol/L (ref 134–144)
Total Protein: 5.6 g/dL — ABNORMAL LOW (ref 6.0–8.5)

## 2019-03-01 LAB — CBC
Hematocrit: 34.6 % (ref 34.0–46.6)
Hemoglobin: 11.6 g/dL (ref 11.1–15.9)
MCH: 28.5 pg (ref 26.6–33.0)
MCHC: 33.5 g/dL (ref 31.5–35.7)
MCV: 85 fL (ref 79–97)
Platelets: 234 10*3/uL (ref 150–450)
RBC: 4.07 x10E6/uL (ref 3.77–5.28)
RDW: 14 % (ref 11.7–15.4)
WBC: 7.3 10*3/uL (ref 3.4–10.8)

## 2019-03-01 LAB — PROTEIN / CREATININE RATIO, URINE
Creatinine, Urine: 157.7 mg/dL
Protein, Ur: 56.9 mg/dL
Protein/Creat Ratio: 361 mg/g creat — ABNORMAL HIGH (ref 0–200)

## 2019-03-02 ENCOUNTER — Encounter: Payer: 59 | Admitting: Obstetrics and Gynecology

## 2019-03-02 LAB — URINE CULTURE, OB REFLEX

## 2019-03-02 LAB — CULTURE, OB URINE

## 2019-03-02 LAB — STREP GP B NAA: Strep Gp B NAA: NEGATIVE

## 2019-03-05 LAB — GC/CHLAMYDIA PROBE AMP
Chlamydia trachomatis, NAA: NEGATIVE
Neisseria Gonorrhoeae by PCR: NEGATIVE

## 2019-03-07 ENCOUNTER — Encounter: Payer: Self-pay | Admitting: Obstetrics and Gynecology

## 2019-03-07 ENCOUNTER — Ambulatory Visit (INDEPENDENT_AMBULATORY_CARE_PROVIDER_SITE_OTHER): Payer: 59 | Admitting: Obstetrics and Gynecology

## 2019-03-07 ENCOUNTER — Other Ambulatory Visit: Payer: 59

## 2019-03-07 ENCOUNTER — Other Ambulatory Visit: Payer: Self-pay

## 2019-03-07 DIAGNOSIS — O0993 Supervision of high risk pregnancy, unspecified, third trimester: Secondary | ICD-10-CM | POA: Diagnosis not present

## 2019-03-07 NOTE — Addendum Note (Signed)
Addended by: Durwin Glaze on: 03/07/2019 03:39 PM   Modules accepted: Orders

## 2019-03-07 NOTE — Progress Notes (Signed)
Patient states that she did not have time to see the doctor today wanted only her NST. At checkout she has questions regarding her labs from last week and also informed them that she could not come to her Thursday appointment as scheduled.  When I found out the above information and had time to review her lab work it was noted that she had a slight elevation of her protein creatinine ratio and a small amount of blood in her urine just as she had last week.  Plan: We will have the office call and make sure she is scheduled to be seen this week.  Further discussion and plan regarding medication for hypertension and preeclampsia important is discussed with patient.  Will again send urine for culture and sensitivity  (last week was negative)

## 2019-03-09 ENCOUNTER — Encounter: Payer: 59 | Admitting: Obstetrics and Gynecology

## 2019-03-09 ENCOUNTER — Other Ambulatory Visit: Payer: 59

## 2019-03-09 LAB — URINE CULTURE: Organism ID, Bacteria: NO GROWTH

## 2019-03-10 ENCOUNTER — Encounter: Payer: 59 | Admitting: Obstetrics and Gynecology

## 2019-03-13 ENCOUNTER — Telehealth: Payer: Self-pay

## 2019-03-13 NOTE — Telephone Encounter (Signed)
Pt prescreened no symptoms pt has face mask.   Coronavirus (COVID-19) Are you at risk?  Are you at risk for the Coronavirus (COVID-19)?  To be considered HIGH RISK for Coronavirus (COVID-19), you have to meet the following criteria:  . Traveled to [place], [place], [place], [place] or [place]; or in the [place] to [place], [place], [place], or [place]; and have fever, cough, and shortness of breath within the last 2 weeks of travel OR . Been in close contact with a person diagnosed with COVID-19 within the last 2 weeks and have fever, cough, and shortness of breath . IF YOU DO NOT MEET THESE CRITERIA, YOU ARE CONSIDERED LOW RISK FOR COVID-19.  What to do if you are HIGH RISK for COVID-19?  . If you are having a medical emergency, call 911. . Seek medical care right away. Before you go to a doctor's office, urgent care or emergency department, call ahead and tell them about your recent travel, contact with someone diagnosed with COVID-19, and your symptoms. You should receive instructions from your physician's office regarding next steps of care.  . When you arrive at healthcare provider, tell the healthcare staff immediately you have returned from visiting [place], [place], [place], [place] or [place]; or traveled in the [place] to [place], [place], [place], or [place]; in the last two weeks or you have been in close contact with a person diagnosed with COVID-19 in the last 2 weeks.   . Tell the health care staff about your symptoms: fever, cough and shortness of breath. . After you have been seen by a medical provider, you will be either: o Tested for (COVID-19) and discharged home on quarantine except to seek medical care if symptoms worsen, and asked to  - Stay home and avoid contact with others until you get your results (4-5 days)  - Avoid travel on public transportation if possible (such as bus, train, or airplane) or o Sent to the Emergency Department by EMS for  evaluation, COVID-19 testing, and possible admission depending on your condition and test results.  What to do if you are LOW RISK for COVID-19?  Reduce your risk of any infection by using the same precautions used for avoiding the common cold or flu:  . Wash your hands often with soap and warm water for at least 20 seconds.  If soap and water are not readily available, use an alcohol-based hand sanitizer with at least 60% alcohol.  . If coughing or sneezing, cover your mouth and nose by coughing or sneezing into the elbow areas of your shirt or coat, into a tissue or into your sleeve (not your hands). . Avoid shaking hands with others and consider head nods or verbal greetings only. . Avoid touching your eyes, nose, or mouth with unwashed hands.  . Avoid close contact with people who are sick. . Avoid places or events with large numbers of people in one location, like concerts or sporting events. . Carefully consider travel plans you have or are making. . If you are planning any travel outside or inside the [place], visit the CDC's Travelers' Health webpage for the latest health notices. . If you have some symptoms but not all symptoms, continue to monitor at home and seek medical attention if your symptoms worsen. . If you are having a medical emergency, call 911.   ADDITIONAL HEALTHCARE OPTIONS FOR PATIENTS  [facility] / e-Visit: [email or web address]           [facility]: [phone]  [facility]: [phone]                   [facility]: [phone]  

## 2019-03-14 ENCOUNTER — Ambulatory Visit (INDEPENDENT_AMBULATORY_CARE_PROVIDER_SITE_OTHER): Payer: 59 | Admitting: Obstetrics and Gynecology

## 2019-03-14 ENCOUNTER — Encounter: Payer: 59 | Admitting: Obstetrics and Gynecology

## 2019-03-14 ENCOUNTER — Ambulatory Visit (INDEPENDENT_AMBULATORY_CARE_PROVIDER_SITE_OTHER): Payer: 59

## 2019-03-14 ENCOUNTER — Other Ambulatory Visit: Payer: Self-pay

## 2019-03-14 ENCOUNTER — Encounter: Payer: Self-pay | Admitting: Obstetrics and Gynecology

## 2019-03-14 VITALS — BP 134/96 | HR 74 | Wt 198.0 lb

## 2019-03-14 DIAGNOSIS — Z3A37 37 weeks gestation of pregnancy: Secondary | ICD-10-CM

## 2019-03-14 DIAGNOSIS — O10919 Unspecified pre-existing hypertension complicating pregnancy, unspecified trimester: Secondary | ICD-10-CM

## 2019-03-14 DIAGNOSIS — O24410 Gestational diabetes mellitus in pregnancy, diet controlled: Secondary | ICD-10-CM

## 2019-03-14 DIAGNOSIS — O119 Pre-existing hypertension with pre-eclampsia, unspecified trimester: Secondary | ICD-10-CM

## 2019-03-14 DIAGNOSIS — O0993 Supervision of high risk pregnancy, unspecified, third trimester: Secondary | ICD-10-CM | POA: Diagnosis not present

## 2019-03-14 DIAGNOSIS — Z8619 Personal history of other infectious and parasitic diseases: Secondary | ICD-10-CM

## 2019-03-14 DIAGNOSIS — O10913 Unspecified pre-existing hypertension complicating pregnancy, third trimester: Secondary | ICD-10-CM

## 2019-03-14 MED ORDER — VALACYCLOVIR HCL 500 MG PO TABS: 500.0000 mg | ORAL_TABLET | Freq: Two times a day (BID) | ORAL | 1 refills | Status: DC

## 2019-03-14 NOTE — Patient Instructions (Signed)
Labor Induction  Labor induction is when steps are taken to cause a pregnant woman to begin the labor process. Most women go into labor on their own between 37 weeks and 42 weeks of pregnancy. When this does not happen or when there is a medical need for labor to begin, steps may be taken to induce labor. Labor induction causes a pregnant woman's uterus to contract. It also causes the cervix to soften (ripen), open (dilate), and thin out (efface). Usually, labor is not induced before 39 weeks of pregnancy unless there is a medical reason to do so. Your health care provider will determine if labor induction is needed. Before inducing labor, your health care provider will consider a number of factors, including:  Your medical condition and your baby's.  How many weeks along you are in your pregnancy.  How mature your baby's lungs are.  The condition of your cervix.  The position of your baby.  The size of your birth canal. What are some reasons for labor induction? Labor may be induced if:  Your health or your baby's health is at risk.  Your pregnancy is overdue by 1 week or more.  Your water breaks but labor does not start on its own.  There is a low amount of amniotic fluid around your baby. You may also choose (elect) to have labor induced at a certain time. Generally, elective labor induction is done no earlier than 39 weeks of pregnancy. What methods are used for labor induction? Methods used for labor induction include:  Prostaglandin medicine. This medicine starts contractions and causes the cervix to dilate and ripen. It can be taken by mouth (orally) or by being inserted into the vagina (suppository).  Inserting a small, thin tube (catheter) with a balloon into the vagina and then expanding the balloon with water to dilate the cervix.  Stripping the membranes. In this method, your health care provider gently separates amniotic sac tissue from the cervix. This causes the  cervix to stretch, which in turn causes the release of a hormone called progesterone. The hormone causes the uterus to contract. This procedure is often done during an office visit, after which you will be sent home to wait for contractions to begin.  Breaking the water. In this method, your health care provider uses a small instrument to make a small hole in the amniotic sac. This eventually causes the amniotic sac to break. Contractions should begin after a few hours.  Medicine to trigger or strengthen contractions. This medicine is given through an IV that is inserted into a vein in your arm. Except for membrane stripping, which can be done in a clinic, labor induction is done in the hospital so that you and your baby can be carefully monitored. How long does it take for labor to be induced? The length of time it takes to induce labor depends on how ready your body is for labor. Some inductions can take up to 2-3 days, while others may take less than a day. Induction may take longer if:  You are induced early in your pregnancy.  It is your first pregnancy.  Your cervix is not ready. What are some risks associated with labor induction? Some risks associated with labor induction include:  Changes in fetal heart rate, such as being too high, too low, or irregular (erratic).  Failed induction.  Infection in the mother or the baby.  Increased risk of having a cesarean delivery.  Fetal death.  Breaking off (abruption)   high, too low, or irregular (erratic).  · Failed induction.  · Infection in the mother or the baby.  · Increased risk of having a cesarean delivery.  · Fetal death.  · Breaking off (abruption) of the placenta from the uterus (rare).  · Rupture of the uterus (very rare).  When induction is needed for medical reasons, the benefits of induction generally outweigh the risks.  What are some reasons for not inducing labor?  Labor induction should not be done if:  · Your baby does not tolerate contractions.  · You have had previous surgeries on your uterus, such as a myomectomy, removal of fibroids, or a vertical scar from a previous cesarean delivery.  · Your placenta lies  very low in your uterus and blocks the opening of the cervix (placenta previa).  · Your baby is not in a head-down position.  · The umbilical cord drops down into the birth canal in front of the baby.  · There are unusual circumstances, such as the baby being very early (premature).  · You have had more than 2 previous cesarean deliveries.  Summary  · Labor induction is when steps are taken to cause a pregnant woman to begin the labor process.  · Labor induction causes a pregnant woman's uterus to contract. It also causes the cervix to ripen, dilate, and efface.  · Labor is not induced before 39 weeks of pregnancy unless there is a medical reason to do so.  · When induction is needed for medical reasons, the benefits of induction generally outweigh the risks.  This information is not intended to replace advice given to you by your health care provider. Make sure you discuss any questions you have with your health care provider.  Document Released: 02/17/2007 Document Revised: 11/11/2016 Document Reviewed: 11/11/2016  Elsevier Interactive Patient Education © 2019 [employer]

## 2019-03-14 NOTE — Progress Notes (Signed)
ROB: Patient notes feeling low back pain, crampy, possible back labor. Discussed relief measures. Also thinks she lost part of her mucus plug earlier this week. Discussed labor precautions.  NST performed today was reviewed and was found to be reactive.  Continue recommended antenatal testing and prenatal care. Normal growth scan today (growth 36%ile). 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 RTC in 1 week for final OB visit. Scheduled for IOL at 39 weeks ( as long as BPs do not reach severe range. Notes well controlled blood sugars (diet).    NONSTRESS TEST INTERPRETATION  INDICATIONS: Chronic hypertension with superimposed mild pre-eclampsia. GDM Class A1  FHR baseline: 140 bpm RESULTS:Reactive COMMENTS: Uterine irritability with occasional ctx present.    PLAN: 1. Continue fetal kick counts twice a day. 2. Continue antepartum testing as scheduled-Biweekly

## 2019-03-14 NOTE — Progress Notes (Signed)
ROB-Pt present today for prenatal care, ultrasound and NST. Pt stated having back pain. Pt stated that she is doing well overall.

## 2019-03-16 ENCOUNTER — Other Ambulatory Visit: Payer: Self-pay

## 2019-03-16 ENCOUNTER — Telehealth: Payer: Self-pay

## 2019-03-16 ENCOUNTER — Observation Stay
Admission: EM | Admit: 2019-03-16 | Discharge: 2019-03-16 | Disposition: A | Discharge status: Home / Self Care | Payer: 59 | Attending: Obstetrics and Gynecology | Admitting: Obstetrics and Gynecology

## 2019-03-16 DIAGNOSIS — Z3A38 38 weeks gestation of pregnancy: Secondary | ICD-10-CM

## 2019-03-16 DIAGNOSIS — O1403 Mild to moderate pre-eclampsia, third trimester: Principal | ICD-10-CM | POA: Insufficient documentation

## 2019-03-16 DIAGNOSIS — O113 Pre-existing hypertension with pre-eclampsia, third trimester: Secondary | ICD-10-CM | POA: Diagnosis not present

## 2019-03-16 DIAGNOSIS — O10919 Unspecified pre-existing hypertension complicating pregnancy, unspecified trimester: Secondary | ICD-10-CM | POA: Diagnosis present

## 2019-03-16 LAB — PROTEIN / CREATININE RATIO, URINE
Creatinine, Urine: 166 mg/dL
Protein Creatinine Ratio: 0.2 mg/mg{Cre} — ABNORMAL HIGH (ref 0.00–0.15)
Total Protein, Urine: 34 mg/dL

## 2019-03-16 LAB — GLUCOSE, CAPILLARY: Glucose-Capillary: 91 mg/dL (ref 70–99)

## 2019-03-16 MED ORDER — OXYCODONE-ACETAMINOPHEN 5-325 MG PO TABS
1.0000 | ORAL_TABLET | Freq: Four times a day (QID) | ORAL | Status: DC | PRN
  Administered 2019-03-16: 1 via ORAL
  Filled 2019-03-16: qty 1

## 2019-03-16 NOTE — Progress Notes (Addendum)
L&D OB Triage Note  Sonya Bauer is a 29 y.o. G2P1001 female at [redacted]w[redacted]d, EDD Estimated Date of Delivery: 03/30/19 who presented to triage for complaints of elevated BPs and headache off and on since yesterday. Reports blood pressure as high as 170/110 at home. Headache started last night and went away, however returned later this morning.  She has not taken anything for the pain. Of note, patient has a history of chronic hypertension in pregnancy, with superimposed mild pre-eclampsia.  She was evaluated by the nurses.   Blood pressures were noted to range 130s-150s/80s-90s. An NST was performed and has been reviewed by MD. She was treated with Percocet which led to resolution of her headache.    Vitals:  Vitals:   03/16/19 1613 03/16/19 1630 03/16/19 1643 03/16/19 1749  BP: (!) 137/94 139/88 140/85 (!) 145/90  Pulse: 89 81 78 74  Weight:      Height:        NST INTERPRETATION: Indications: chronic hypertension with superimposed mild pre-eclampsia.   Mode: External Baseline Rate (A): 130 bpm Variability: Moderate Accelerations: 15 x 15 Decelerations: None     Contraction Frequency (min): none  Impression: reactive   Plan: NST performed was reviewed and was found to be reactive. She was discharged home with bleeding/labor precautions.  Continue routine prenatal care. Her induction date was moved from 03/24/2019 to 03/21/2019. Advised to continue to take BP meds as prescribed.     Rubie Maid, MD  [facility]

## 2019-03-16 NOTE — Telephone Encounter (Signed)
Per Dr. Marcelline Mates she wants patient to go to labor and delivery to be monitored. Patient verbalized understanding.

## 2019-03-16 NOTE — Telephone Encounter (Signed)
Spoke with patient and she is going to take her BP again then call us back with a reading. Nurse visit was offered per Dr. Marcelline Mates. She can't come in until after 3PM.

## 2019-03-16 NOTE — OB Triage Note (Signed)
Patient presented to L&D with complaints of headache and elevated blood pressures at home. She says she was feeling bad around 0000 this morning so she took her blood pressure and it was 170/110, headache present at 0000 but went away. Checked it again this morning at it was 169/106. Patient says her headache is back. 6/10 pain.  Denies spots, blurred vision or RUQ pain.

## 2019-03-16 NOTE — Telephone Encounter (Signed)
Ob pt- 38 weeks  Elevated bp last nite- 170/110  Took meds this am- 150/107.  Pls advise.

## 2019-03-16 NOTE — Telephone Encounter (Signed)
Coronavirus (COVID-19) Are you at risk?  Are you at risk for the Coronavirus (COVID-19)?  To be considered HIGH RISK for Coronavirus (COVID-19), you have to meet the following criteria:  . Traveled to [place], [place], [place], [place] or [place]; or in the [place] to [place], [place], [place], or [place]; and have fever, cough, and shortness of breath within the last 2 weeks of travel OR . Been in close contact with a person diagnosed with COVID-19 within the last 2 weeks and have fever, cough, and shortness of breath . IF YOU DO NOT MEET THESE CRITERIA, YOU ARE CONSIDERED LOW RISK FOR COVID-19.  What to do if you are HIGH RISK for COVID-19?  . If you are having a medical emergency, call 911. . Seek medical care right away. Before you go to a doctor's office, urgent care or emergency department, call ahead and tell them about your recent travel, contact with someone diagnosed with COVID-19, and your symptoms. You should receive instructions from your physician's office regarding next steps of care.  . When you arrive at healthcare provider, tell the healthcare staff immediately you have returned from visiting [place], [place], [place], [place] or [place]; or traveled in the [place] to [place], [place], [place], or [place]; in the last two weeks or you have been in close contact with a person diagnosed with COVID-19 in the last 2 weeks.   . Tell the health care staff about your symptoms: fever, cough and shortness of breath. . After you have been seen by a medical provider, you will be either: o Tested for (COVID-19) and discharged home on quarantine except to seek medical care if symptoms worsen, and asked to  - Stay home and avoid contact with others until you get your results (4-5 days)  - Avoid travel on public transportation if possible (such as bus, train, or airplane) or o Sent to the Emergency Department by EMS for evaluation, COVID-19 testing, and possible  admission depending on your condition and test results.  What to do if you are LOW RISK for COVID-19?  Reduce your risk of any infection by using the same precautions used for avoiding the common cold or flu:  . Wash your hands often with soap and warm water for at least 20 seconds.  If soap and water are not readily available, use an alcohol-based hand sanitizer with at least 60% alcohol.  . If coughing or sneezing, cover your mouth and nose by coughing or sneezing into the elbow areas of your shirt or coat, into a tissue or into your sleeve (not your hands). . Avoid shaking hands with others and consider head nods or verbal greetings only. . Avoid touching your eyes, nose, or mouth with unwashed hands.  . Avoid close contact with people who are sick. . Avoid places or events with large numbers of people in one location, like concerts or sporting events. . Carefully consider travel plans you have or are making. . If you are planning any travel outside or inside the [place], visit the CDC's Travelers' Health webpage for the latest health notices. . If you have some symptoms but not all symptoms, continue to monitor at home and seek medical attention if your symptoms worsen. . If you are having a medical emergency, call 911.   ADDITIONAL HEALTHCARE OPTIONS FOR PATIENTS  [facility] / e-Visit: [email or web address]         [facility]: [phone]  [facility]   Urgent Care: [facility]: [phone]   Prescreend. Neg. cm

## 2019-03-17 ENCOUNTER — Other Ambulatory Visit: Payer: 59

## 2019-03-17 ENCOUNTER — Other Ambulatory Visit: Payer: 59 | Admitting: Obstetrics and Gynecology

## 2019-03-17 ENCOUNTER — Ambulatory Visit: Admit: 2019-03-17 | Discharge: 2019-03-17 | Disposition: A | Discharge status: Home / Self Care | Payer: 59

## 2019-03-21 ENCOUNTER — Inpatient Hospital Stay: Payer: 59 | Admitting: Anesthesiology

## 2019-03-21 ENCOUNTER — Other Ambulatory Visit: Payer: 59

## 2019-03-21 ENCOUNTER — Other Ambulatory Visit: Payer: Self-pay

## 2019-03-21 ENCOUNTER — Inpatient Hospital Stay
Admission: EM | Admit: 2019-03-21 | Discharge: 2019-03-22 | DRG: 806 | Disposition: A | Discharge status: Home / Self Care | Payer: 59 | Attending: Obstetrics and Gynecology | Admitting: Obstetrics and Gynecology

## 2019-03-21 ENCOUNTER — Encounter: Payer: 59 | Admitting: Obstetrics and Gynecology

## 2019-03-21 DIAGNOSIS — K219 Gastro-esophageal reflux disease without esophagitis: Secondary | ICD-10-CM | POA: Diagnosis present

## 2019-03-21 DIAGNOSIS — O9081 Anemia of the puerperium: Secondary | ICD-10-CM | POA: Diagnosis not present

## 2019-03-21 DIAGNOSIS — Z3A38 38 weeks gestation of pregnancy: Secondary | ICD-10-CM

## 2019-03-21 DIAGNOSIS — O24420 Gestational diabetes mellitus in childbirth, diet controlled: Secondary | ICD-10-CM

## 2019-03-21 DIAGNOSIS — O9962 Diseases of the digestive system complicating childbirth: Secondary | ICD-10-CM | POA: Diagnosis present

## 2019-03-21 DIAGNOSIS — O9832 Other infections with a predominantly sexual mode of transmission complicating childbirth: Secondary | ICD-10-CM | POA: Diagnosis present

## 2019-03-21 DIAGNOSIS — B009 Herpesviral infection, unspecified: Secondary | ICD-10-CM

## 2019-03-21 DIAGNOSIS — Z1159 Encounter for screening for other viral diseases: Secondary | ICD-10-CM

## 2019-03-21 DIAGNOSIS — O24410 Gestational diabetes mellitus in pregnancy, diet controlled: Secondary | ICD-10-CM

## 2019-03-21 DIAGNOSIS — O119 Pre-existing hypertension with pre-eclampsia, unspecified trimester: Secondary | ICD-10-CM | POA: Diagnosis present

## 2019-03-21 DIAGNOSIS — O114 Pre-existing hypertension with pre-eclampsia, complicating childbirth: Principal | ICD-10-CM

## 2019-03-21 DIAGNOSIS — A6000 Herpesviral infection of urogenital system, unspecified: Secondary | ICD-10-CM | POA: Diagnosis present

## 2019-03-21 DIAGNOSIS — O1002 Pre-existing essential hypertension complicating childbirth: Secondary | ICD-10-CM | POA: Diagnosis present

## 2019-03-21 LAB — COMPREHENSIVE METABOLIC PANEL
ALT: 12 U/L (ref 0–44)
AST: 22 U/L (ref 15–41)
Albumin: 3.3 g/dL — ABNORMAL LOW (ref 3.5–5.0)
Alkaline Phosphatase: 124 U/L (ref 38–126)
Anion gap: 10 (ref 5–15)
BUN: 10 mg/dL (ref 6–20)
CO2: 20 mmol/L — ABNORMAL LOW (ref 22–32)
Calcium: 8.9 mg/dL (ref 8.9–10.3)
Chloride: 107 mmol/L (ref 98–111)
Creatinine, Ser: 0.45 mg/dL (ref 0.44–1.00)
GFR calc Af Amer: >60 mL/min (ref >60)
GFR calc non Af Amer: >60 mL/min (ref >60)
Glucose, Bld: 85 mg/dL (ref 70–99)
Potassium: 3.5 mmol/L (ref 3.5–5.1)
Sodium: 137 mmol/L (ref 135–145)
Total Bilirubin: 0.7 mg/dL (ref 0.3–1.2)
Total Protein: 6.3 g/dL — ABNORMAL LOW (ref 6.5–8.1)

## 2019-03-21 LAB — TYPE AND SCREEN
ABO/RH(D): O POS
Antibody Screen: NEGATIVE

## 2019-03-21 LAB — CBC
HCT: 35.4 % — ABNORMAL LOW (ref 36.0–46.0)
Hemoglobin: 12.1 g/dL (ref 12.0–15.0)
MCH: 28.4 pg (ref 26.0–34.0)
MCHC: 34.2 g/dL (ref 30.0–36.0)
MCV: 83.1 fL (ref 80.0–100.0)
Platelets: 212 10*3/uL (ref 150–400)
RBC: 4.26 MIL/uL (ref 3.87–5.11)
RDW: 14.6 % (ref 11.5–15.5)
WBC: 8 10*3/uL (ref 4.0–10.5)
nRBC: 0 % (ref 0.0–0.2)

## 2019-03-21 LAB — PROTEIN / CREATININE RATIO, URINE
Creatinine, Urine: 122 mg/dL
Protein Creatinine Ratio: 0.23 mg/mg{Cre} — ABNORMAL HIGH (ref 0.00–0.15)
Total Protein, Urine: 28 mg/dL

## 2019-03-21 LAB — GLUCOSE, CAPILLARY
Glucose-Capillary: 86 mg/dL (ref 70–99)
Glucose-Capillary: 82 mg/dL (ref 70–99)
Glucose-Capillary: 72 mg/dL (ref 70–99)

## 2019-03-21 LAB — SARS CORONAVIRUS 2 BY RT PCR (HOSPITAL ORDER, PERFORMED IN ~~LOC~~ HOSPITAL LAB): SARS Coronavirus 2: NEGATIVE

## 2019-03-21 MED ORDER — DIPHENHYDRAMINE HCL 50 MG/ML IJ SOLN: 12.5000 mg | INTRAMUSCULAR | Status: DC | PRN

## 2019-03-21 MED ORDER — EPHEDRINE 5 MG/ML INJ: 10.0000 mg | INTRAVENOUS | Status: DC | PRN

## 2019-03-21 MED ORDER — DIBUCAINE (PERIANAL) 1 % EX OINT: 1.0000 "application " | TOPICAL_OINTMENT | CUTANEOUS | Status: DC | PRN

## 2019-03-21 MED ORDER — HYDRALAZINE HCL 20 MG/ML IJ SOLN
10.0000 mg | INTRAMUSCULAR | Status: DC | PRN
  Administered 2019-03-21: 10 mg via INTRAVENOUS

## 2019-03-21 MED ORDER — AMMONIA AROMATIC IN INHA
RESPIRATORY_TRACT | Status: AC
  Filled 2019-03-21: qty 10

## 2019-03-21 MED ORDER — FENTANYL-BUPIVACAINE-NACL 0.5-0.125-0.9 MG/250ML-% EP SOLN: 12.0000 mL/h | EPIDURAL | Status: DC | PRN

## 2019-03-21 MED ORDER — PHENYLEPHRINE 40 MCG/ML (10ML) SYRINGE FOR IV PUSH (FOR BLOOD PRESSURE SUPPORT): 80.0000 ug | PREFILLED_SYRINGE | INTRAVENOUS | Status: DC | PRN

## 2019-03-21 MED ORDER — LIDOCAINE HCL (PF) 1 % IJ SOLN
INTRAMUSCULAR | Status: AC
  Filled 2019-03-21: qty 30

## 2019-03-21 MED ORDER — ONDANSETRON HCL 4 MG/2ML IJ SOLN
4.0000 mg | Freq: Four times a day (QID) | INTRAMUSCULAR | Status: DC | PRN
  Administered 2019-03-21: 4 mg via INTRAVENOUS
  Filled 2019-03-21: qty 2

## 2019-03-21 MED ORDER — SOD CITRATE-CITRIC ACID 500-334 MG/5ML PO SOLN: 30.0000 mL | ORAL | Status: DC | PRN

## 2019-03-21 MED ORDER — FENTANYL 2.5 MCG/ML W/ROPIVACAINE 0.15% IN NS 100 ML EPIDURAL (ARMC)
EPIDURAL | Status: DC | PRN
  Administered 2019-03-21: 12 mL/h via EPIDURAL

## 2019-03-21 MED ORDER — PRENATAL MULTIVITAMIN CH
1.0000 | ORAL_TABLET | Freq: Every day | ORAL | Status: DC
  Administered 2019-03-22: 1 via ORAL
  Filled 2019-03-21: qty 1

## 2019-03-21 MED ORDER — SODIUM CHLORIDE 0.9 % IV SOLN
INTRAVENOUS | Status: DC | PRN
  Administered 2019-03-21 (×3): 5 mL via EPIDURAL

## 2019-03-21 MED ORDER — FENTANYL 2.5 MCG/ML W/ROPIVACAINE 0.15% IN NS 100 ML EPIDURAL (ARMC)
EPIDURAL | Status: AC
  Filled 2019-03-21: qty 100

## 2019-03-21 MED ORDER — IBUPROFEN 600 MG PO TABS
600.0000 mg | ORAL_TABLET | Freq: Four times a day (QID) | ORAL | Status: DC
  Administered 2019-03-21 – 2019-03-22 (×5): 600 mg via ORAL
  Filled 2019-03-21 (×5): qty 1

## 2019-03-21 MED ORDER — MISOPROSTOL 50MCG HALF TABLET
50.0000 ug | ORAL_TABLET | ORAL | Status: DC | PRN
  Administered 2019-03-21 (×3): 50 ug via VAGINAL
  Filled 2019-03-21 (×3): qty 1

## 2019-03-21 MED ORDER — LACTATED RINGERS IV SOLN: 500.0000 mL | Freq: Once | INTRAVENOUS | Status: DC

## 2019-03-21 MED ORDER — ACETAMINOPHEN 325 MG PO TABS: 650.0000 mg | ORAL_TABLET | ORAL | Status: DC | PRN

## 2019-03-21 MED ORDER — WITCH HAZEL-GLYCERIN EX PADS: 1.0000 "application " | MEDICATED_PAD | CUTANEOUS | Status: DC | PRN

## 2019-03-21 MED ORDER — OXYCODONE-ACETAMINOPHEN 5-325 MG PO TABS: 1.0000 | ORAL_TABLET | ORAL | Status: DC | PRN

## 2019-03-21 MED ORDER — OXYTOCIN 40 UNITS IN NORMAL SALINE INFUSION - SIMPLE MED: 1.0000 m[IU]/min | INTRAVENOUS | Status: DC

## 2019-03-21 MED ORDER — LACTATED RINGERS IV SOLN: 500.0000 mL | INTRAVENOUS | Status: DC | PRN

## 2019-03-21 MED ORDER — COCONUT OIL OIL
1.0000 "application " | TOPICAL_OIL | Status: DC | PRN
  Administered 2019-03-22: 1 via TOPICAL
  Filled 2019-03-21: qty 120

## 2019-03-21 MED ORDER — METHYLDOPA 250 MG PO TABS
250.0000 mg | ORAL_TABLET | Freq: Three times a day (TID) | ORAL | Status: DC
  Administered 2019-03-21: 250 mg via ORAL
  Filled 2019-03-21 (×2): qty 1

## 2019-03-21 MED ORDER — ZOLPIDEM TARTRATE 5 MG PO TABS: 5.0000 mg | ORAL_TABLET | Freq: Every evening | ORAL | Status: DC | PRN

## 2019-03-21 MED ORDER — METHYLDOPA 250 MG PO TABS
500.0000 mg | ORAL_TABLET | Freq: Three times a day (TID) | ORAL | Status: DC
  Filled 2019-03-21: qty 2

## 2019-03-21 MED ORDER — OXYTOCIN 40 UNITS IN NORMAL SALINE INFUSION - SIMPLE MED
2.5000 [IU]/h | INTRAVENOUS | Status: DC
  Filled 2019-03-21: qty 1000

## 2019-03-21 MED ORDER — HYDRALAZINE HCL 20 MG/ML IJ SOLN
5.0000 mg | INTRAMUSCULAR | Status: DC | PRN
  Administered 2019-03-21: 5 mg via INTRAVENOUS
  Filled 2019-03-21: qty 1

## 2019-03-21 MED ORDER — MISOPROSTOL 200 MCG PO TABS
ORAL_TABLET | ORAL | Status: AC
  Administered 2019-03-21: 50 ug via VAGINAL
  Filled 2019-03-21: qty 4

## 2019-03-21 MED ORDER — TERBUTALINE SULFATE 1 MG/ML IJ SOLN: 0.2500 mg | Freq: Once | INTRAMUSCULAR | Status: DC | PRN

## 2019-03-21 MED ORDER — FENTANYL 2.5 MCG/ML W/ROPIVACAINE 0.15% IN NS 100 ML EPIDURAL (ARMC): 12.0000 mL/h | EPIDURAL | Status: DC

## 2019-03-21 MED ORDER — ONDANSETRON HCL 4 MG/2ML IJ SOLN: 4.0000 mg | INTRAMUSCULAR | Status: DC | PRN

## 2019-03-21 MED ORDER — ONDANSETRON HCL 4 MG PO TABS: 4.0000 mg | ORAL_TABLET | ORAL | Status: DC | PRN

## 2019-03-21 MED ORDER — OXYCODONE-ACETAMINOPHEN 5-325 MG PO TABS: 2.0000 | ORAL_TABLET | ORAL | Status: DC | PRN

## 2019-03-21 MED ORDER — BUTORPHANOL TARTRATE 2 MG/ML IJ SOLN
1.0000 mg | INTRAMUSCULAR | Status: DC | PRN
  Administered 2019-03-21 (×2): 1 mg via INTRAVENOUS
  Filled 2019-03-21 (×2): qty 1

## 2019-03-21 MED ORDER — LIDOCAINE HCL (PF) 1 % IJ SOLN: 30.0000 mL | INTRAMUSCULAR | Status: DC | PRN

## 2019-03-21 MED ORDER — LACTATED RINGERS IV SOLN
INTRAVENOUS | Status: DC
  Administered 2019-03-21: 01:00:00 via INTRAVENOUS

## 2019-03-21 MED ORDER — LIDOCAINE-EPINEPHRINE (PF) 1.5 %-1:200000 IJ SOLN
INTRAMUSCULAR | Status: DC | PRN
  Administered 2019-03-21: 3 mL

## 2019-03-21 MED ORDER — OXYTOCIN 10 UNIT/ML IJ SOLN
INTRAMUSCULAR | Status: AC
  Filled 2019-03-21: qty 2

## 2019-03-21 MED ORDER — LABETALOL HCL 5 MG/ML IV SOLN
20.0000 mg | INTRAVENOUS | Status: DC | PRN
  Administered 2019-03-21: 20 mg via INTRAVENOUS
  Filled 2019-03-21: qty 4

## 2019-03-21 MED ORDER — LABETALOL HCL 5 MG/ML IV SOLN: 40.0000 mg | INTRAVENOUS | Status: DC | PRN

## 2019-03-21 MED ORDER — TETANUS-DIPHTH-ACELL PERTUSSIS 5-2.5-18.5 LF-MCG/0.5 IM SUSP: 0.5000 mL | Freq: Once | INTRAMUSCULAR | Status: DC

## 2019-03-21 MED ORDER — SENNOSIDES-DOCUSATE SODIUM 8.6-50 MG PO TABS
2.0000 | ORAL_TABLET | ORAL | Status: DC
  Administered 2019-03-22: 2 via ORAL
  Filled 2019-03-21: qty 2

## 2019-03-21 MED ORDER — DIPHENHYDRAMINE HCL 25 MG PO CAPS: 25.0000 mg | ORAL_CAPSULE | Freq: Four times a day (QID) | ORAL | Status: DC | PRN

## 2019-03-21 MED ORDER — SIMETHICONE 80 MG PO CHEW: 80.0000 mg | CHEWABLE_TABLET | ORAL | Status: DC | PRN

## 2019-03-21 MED ORDER — OXYTOCIN BOLUS FROM INFUSION
500.0000 mL | Freq: Once | INTRAVENOUS | Status: AC
  Administered 2019-03-21: 500 mL via INTRAVENOUS

## 2019-03-21 MED ORDER — BENZOCAINE-MENTHOL 20-0.5 % EX AERO: 1.0000 "application " | INHALATION_SPRAY | CUTANEOUS | Status: DC | PRN

## 2019-03-21 NOTE — Progress Notes (Signed)
Intrapartum Progress Note  S: Patient feeling contractions.  O:  Vitals:   03/21/19 0956 03/21/19 1007 03/21/19 1022 03/21/19 1052  BP: (!) 161/112 (!) 154/105 (!) 154/98 (!) 166/87  Pulse: 84 80 86 73  Resp:      Temp:      TempSrc:      Weight:      Height:        Gen App: NAD, mildly uncomfortable with contractions Abdomen: soft, gravid FHT: baseline 135 bpm.  Accels present.  Decels absent. moderate in degree variability.   Tocometer: contractions q every 1-2 minutes palpable, difficult to assess on tocometer.  Cervix: 2.5-3/60/-3 Extremities: Nontender, no edema.  Pitocin: None  Labs: No new labs. Last  Accucheck 72.    Assessment:  1: SIUP at [redacted]w[redacted]d 2. Chronic HTN with mild superimposed pre-eclampsia 3. GDM Plan:  1. Continue Aldomet for cHTN.  Patient has had 1 treatable severe range BP, given Hydralazine.  2. Continue q4 hr accuchecks for GDM.  3. S/p 3rd dose of Cytotec. Foley bulb placed. Will likely AROM and begin Pitocin after next check.   Rubie Maid, MD 03/21/2019 11:07 AM

## 2019-03-21 NOTE — Anesthesia Preprocedure Evaluation (Signed)
Anesthesia Evaluation  Patient identified by MRN, date of birth, ID band Patient awake    Reviewed: Allergy & Precautions, NPO status , Patient's Chart, lab work & pertinent test results  History of Anesthesia Complications Negative for: history of anesthetic complications  Airway Mallampati: II  TM Distance: >3 FB Neck ROM: Full    Dental no notable dental hx.    Pulmonary neg pulmonary ROS, neg sleep apnea, neg COPD,    breath sounds clear to auscultation- rhonchi (-) wheezing      Cardiovascular hypertension, Pt. on medications (-) CAD, (-) Past MI, (-) Cardiac Stents and (-) CABG  Rhythm:Regular Rate:Normal - Systolic murmurs and - Diastolic murmurs    Neuro/Psych  Headaches, PSYCHIATRIC DISORDERS Depression    GI/Hepatic Neg liver ROS, GERD  ,  Endo/Other  diabetes, Gestational  Renal/GU Renal disease: hx of nephrolithiasis.     Musculoskeletal negative musculoskeletal ROS (+)   Abdominal (+) + obese,   Peds  Hematology negative hematology ROS (+)   Anesthesia Other Findings Past Medical History: No date: Complication of anesthesia No date: Depression No date: Family history of adverse reaction to anesthesia     Comment:  MOM-HAD SEIZURE DURING SURGERY  No date: GERD (gastroesophageal reflux disease)     Comment:  EVERY OTHER DAY No date: Gestational diabetes No date: Headache No date: Heart murmur     Comment:  ASYMPTOMATIC No date: Heartburn No date: History of kidney stones No date: Hypertension No date: Kidney stones No date: Motion sickness     Comment:  long trips No date: Pregnancy induced hypertension 11/16/14: Thyroid cyst   Reproductive/Obstetrics (+) Pregnancy                             Lab Results  Component Value Date   WBC 8.0 03/21/2019   HGB 12.1 03/21/2019   HCT 35.4 (L) 03/21/2019   MCV 83.1 03/21/2019   PLT 212 03/21/2019    Anesthesia  Physical Anesthesia Plan  ASA: II  Anesthesia Plan: Epidural   Post-op Pain Management:    Induction:   PONV Risk Score and Plan: 2  Airway Management Planned:   Additional Equipment:   Intra-op Plan:   Post-operative Plan:   Informed Consent: I have reviewed the patients History and Physical, chart, labs and discussed the procedure including the risks, benefits and alternatives for the proposed anesthesia with the patient or authorized representative who has indicated his/her understanding and acceptance.       Plan Discussed with: CRNA and Anesthesiologist  Anesthesia Plan Comments: (Plan for epidural for labor, discussed epidural vs spinal vs GA if need for csection)        Anesthesia Quick Evaluation

## 2019-03-21 NOTE — Progress Notes (Signed)
Intrapartum Progress Note  S: Patient feeling contractions. Is s/p 1 dose of Stadol.   O:  Vitals:   03/21/19 1108 03/21/19 1117 03/21/19 1122 03/21/19 1251  BP: (!) 168/106  (!) 156/92 (!) 154/93  Pulse: 76  82 79  Resp:      Temp:  98.1 F (36.7 C)    TempSrc:  Oral    Weight:      Height:        Gen App: NAD, moderately uncomfortable with contractions Abdomen: soft, gravid FHT: baseline 140 bpm.  Accels present.  Decels absent. moderate in degree variability.   Tocometer: contractions q every 2-3 minutes. Cervix: 4/70/-2 to -1 Extremities: Nontender, no edema.  Pitocin: None  Labs: No new labs. Last  Accucheck 72.    Assessment:  1: SIUP at [redacted]w[redacted]d 2. Chronic HTN with mild superimposed pre-eclampsia 3. GDM  Plan:  1. Continue Aldomet for cHTN. Increased next dose to 500 mg.   Patient has had 2 treatable severe range BPs, given Hydralazine.  2. Continue q4 hr accuchecks for GDM.  3. S/p 3rd dose of Cytotec. Foley bulb expelled at 1140.  AROM'd.  If contractions decrease in frequency, can begin Pitocin.    Rubie Maid, MD 03/21/2019 1:05 PM

## 2019-03-21 NOTE — Lactation Note (Addendum)
This note was copied from a baby's chart. Lactation Consultation Note  Patient Name: Sonya Bauer Date: 03/21/2019 Reason for consult: Initial assessment   Maternal Data Formula Feeding for Exclusion: No  Feeding Feeding Type: Breast Fed  LATCH Score Latch: Repeated attempts needed to sustain latch, nipple held in mouth throughout feeding, stimulation needed to elicit sucking reflex.  Audible Swallowing: A few with stimulation  Type of Nipple: Everted at rest and after stimulation  Comfort (Breast/Nipple): Soft / non-tender  Hold (Positioning): Assistance needed to correctly position infant at breast and maintain latch.  LATCH Score: 7  Interventions Interventions: Breast feeding basics reviewed;Assisted with latch;Hand express;Adjust position;Support pillows  Lactation Tools Discussed/Used     Consult Status Consult Status: Follow-up Date: 03/21/19 Follow-up type: In-patient  Lactation assisted with breastfeeding. Infant was able to latch after a couple attempts. Mother states she can feel strong tugging and pulling while latched. Mother educated about clusterfeeding, frequency of wet and dirty diapers to expect and how to hand express.   LC returned to hand express 10 mL of colostrum due to infant blood sugar. Colostrum fed through spoon and by curved tipped syringe.  Sonya Bauer 11/19/4130, 3:40 PM

## 2019-03-21 NOTE — Anesthesia Procedure Notes (Signed)
Epidural Patient location during procedure: OB Start time: 03/21/2019 1:43 PM End time: 03/21/2019 2:01 PM  Staffing Anesthesiologist: Emmie Niemann, MD Performed: anesthesiologist   Preanesthetic Checklist Completed: patient identified, site marked, surgical consent, pre-op evaluation, timeout performed, IV checked, risks and benefits discussed and monitors and equipment checked  Epidural Patient position: sitting Prep: ChloraPrep Patient monitoring: heart rate, continuous pulse ox and blood pressure Approach: midline Location: L4-L5 Injection technique: LOR saline  Needle:  Needle type: Tuohy  Needle gauge: 18 G Needle length: 9 cm and 9 Needle insertion depth: 5.5 cm Catheter type: closed end flexible Catheter size: 20 Guage Catheter at skin depth: 10 cm Test dose: negative (0.125% bupivacaine)  Assessment Events: blood not aspirated, injection not painful, no injection resistance, negative IV test and no paresthesia  Additional Notes   Patient tolerated the insertion well without complications.Reason for block:procedure for pain

## 2019-03-21 NOTE — H&P (Addendum)
, Obstetric History and Physical  Sonya Bauer is a 28 y.o. G2P1001 with IUP at [redacted]w[redacted]d presenting for scheduled IOL for chronic hypertension in pregnancy with superimposed mild pre-eclampsia.  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 Patient states she has been having  Occasional mild contractions, none vaginal bleeding, intact membranes, with active fetal movement.    Prenatal Course Source of Care: [facility] with onset of care at 9 weeks Pregnancy complications or risks: Patient Active Problem List   Diagnosis Date Noted  . Chronic hypertension with superimposed pre-eclampsia 03/21/2019  . Chronic hypertension during pregnancy 03/16/2019  . Labor and delivery indication for care or intervention 03/16/2019  . Diet controlled gestational diabetes mellitus (GDM) in third trimester 01/27/2019  . Gastroesophageal reflux in pregnancy 11/14/2018  . Sinus pressure 11/14/2018  . Chronic hypertension affecting pregnancy 11/14/2018  . Cervical radiculopathy 12/16/2017  . Supervision of high risk pregnancy in third trimester 06/13/2015  . 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 Multinodular goiter 11/16/2014   She plans to breastfeed She desires undecided method for postpartum contraception.   Prenatal labs and studies: ABO, Rh: --/--/O POS (06/09 6644) Antibody: NEG (06/09 0208) Rubella: 7.57 (11/12 1438) RPR: Non Reactive (03/31 1008)  HBsAg: Negative (11/12 1438)  HIV: Non Reactive (11/12 1438)  IHK:VQQVZDGL (05/19 1634) 1 hr Glucola  Abnormal, with 3 hr GTT abnormal.  Genetic screening normal Anatomy US normal   Past Medical History:  Diagnosis Date  . Complication of anesthesia   . Depression   . Family history of adverse reaction to anesthesia    MOM-HAD SEIZURE DURING SURGERY   . GERD (gastroesophageal reflux disease)    EVERY OTHER DAY  . Gestational diabetes   . Headache   . Heart murmur    ASYMPTOMATIC  . Heartburn   .  History of kidney stones   . Hypertension   . Kidney stones   . Motion sickness    long trips  . Pregnancy induced hypertension   . 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 CYSTOSCOPY WITH STENT PLACEMENT Left 10/06/2016   Procedure: CYSTOSCOPY WITH STENT PLACEMENT;  Surgeon: Alexis Frock, MD;  Location: [facility];  Service: Urology;  Laterality: Left;  . TONSILLECTOMY AND ADENOIDECTOMY N/A 05/20/2018   Procedure: TONSILLECTOMY;  Surgeon: Beverly Gust, MD;  Location: [facility];  Service: ENT;  Laterality: N/A;  . URETEROSCOPY WITH HOLMIUM LASER LITHOTRIPSY Left 10/06/2016   Procedure: URETEROSCOPY WITH HOLMIUM LASER LITHOTRIPSY;  Surgeon: Alexis Frock, MD;  Location: [facility];  Service: Urology;  Laterality: Left;  . WISDOM TOOTH EXTRACTION     AGE 41    OB History  Gravida Para Term Preterm AB Living  2 1 1     1   SAB TAB Ectopic Multiple Live Births        0 1    # Outcome Date GA Lbr Len/2nd Weight Sex Delivery Anes PTL Lv  2 Current           1 Term 07/02/15 [redacted]w[redacted]d 05:50 / 00:14 2910 g F Vag-Spont EPI  LIV    Social History   Socioeconomic History  . Marital status: Married    Spouse name: Not on file  . Number of children: Not on file  . Years of education: Not on file  . Highest education level: Not on file  Occupational History  . Not on file  Social Needs  .  Financial resource strain: Not on file  . Food insecurity:    Worry: Not on file    Inability: Not on file  . Transportation needs:    Medical: Not on file    Non-medical: Not on file  Tobacco Use  . Smoking status: Never Smoker  . Smokeless tobacco: Never Used  Substance and Sexual Activity  . Alcohol use: Not Currently    Comment: may have 2-3 drinks 2x/month  . Drug use: No  . Sexual activity: Yes  Lifestyle  . Physical activity:    Days per week: Not on file    Minutes per session: Not on file  . Stress: Not on file  Relationships  .  Social connections:    Talks on phone: Not on file    Gets together: Not on file    Attends religious service: Not on file    Active member of club or organization: Not on file    Attends meetings of clubs or organizations: Not on file    Relationship status: Not on file  Other Topics Concern  . Not on file  Social History Narrative  . Not on file    Family History  Problem Relation Age of Onset  . Hypertension Father   . Asthma Mother   . Diabetes Maternal Aunt   . Diabetes Maternal Grandmother   . Kidney disease Neg Hx   . Bladder Cancer Neg Hx     Medications Prior to Admission  Medication Sig Dispense Refill Last Dose  . acetaminophen (TYLENOL) 500 MG tablet Take 500 mg by mouth every 6 (six) hours as needed.    Past Week at Unknown time  . Butalbital-APAP-Caffeine 50-325-40 MG capsule Take 1-2 capsules by mouth every 6 (six) hours as needed for headache. 30 capsule 3 03/21/2019 at Unknown time  . glucose blood test strip Use as instructed 100 each 12 03/21/2019 at Unknown time  . loratadine (CLARITIN) 10 MG tablet Take 1 tablet (10 mg total) by mouth daily. 30 tablet 4 03/20/2019 at 2100  . methyldopa (ALDOMET) 250 MG tablet TAKE 1 TABLET(250 MG) BY MOUTH THREE TIMES DAILY 90 tablet 1 03/21/2019 at Unknown time  . pantoprazole (PROTONIX) 20 MG tablet Take 1 tablet (20 mg total) by mouth 2 (two) times daily before a meal. 60 tablet 2 03/21/2019 at 1800  . Prenatal Vit-Fe Fumarate-FA (PRENATAL VITAMIN PO) Take 1 tablet by mouth daily.   03/21/2019 at Unknown time  . valACYclovir (VALTREX) 500 MG tablet Take 1 tablet (500 mg total) by mouth 2 (two) times daily. 30 tablet 1 03/21/2019 at 1800  . aspirin EC 81 MG tablet Take 81 mg by mouth daily.   Not Taking at Unknown time    Allergies  Allergen Reactions  . Azithromycin Nausea And Vomiting    Review of Systems: Negative except for what is mentioned in HPI.  Physical Exam: BP (!) 152/104   Pulse 78   Temp 98 F (36.7 C)   Resp 16    Ht 5\' 5"  (1.651 m)   Wt 89.8 kg   LMP 06/23/2018   BMI 32.95 kg/m  CONSTITUTIONAL: Well-developed, well-nourished female in no acute distress.  HENT:  Normocephalic, atraumatic, External right and left ear normal. Oropharynx is clear and moist EYES: Conjunctivae and EOM are normal. Pupils are equal, round, and reactive to light. No scleral icterus.  NECK: Normal range of motion, supple, no masses SKIN: Skin is warm and dry. No rash noted. Not diaphoretic. No  erythema. No pallor. NEUROLOGIC: Alert and oriented to person, place, and time. Normal reflexes, muscle tone coordination. No cranial nerve deficit noted. PSYCHIATRIC: Normal mood and affect. Normal behavior. Normal judgment and thought content. CARDIOVASCULAR: Normal heart rate noted, regular rhythm RESPIRATORY: Effort and breath sounds normal, no problems with respiration noted ABDOMEN: Soft, nontender, nondistended, gravid. MUSCULOSKELETAL: Normal range of motion. No edema and no tenderness. 2+ distal pulses.  Cervical Exam: Dilatation 1cm   Effacement 40%   Station -3   Presentation: cephalic FHT:  Baseline rate 145 bpm   Variability moderate  Accelerations present   Decelerations none Contractions: Every 1-3 mins   Pertinent Labs/Studies:   Results for orders placed or performed during the hospital encounter of 03/21/19 (from the past 24 hour(s))  SARS Coronavirus 2 (CEPHEID - Performed in [place] hospital lab), Hosp Order     Status: None   Collection Time: 03/21/19 12:17 AM  Result Value Ref Range   SARS Coronavirus 2 NEGATIVE NEGATIVE  CBC     Status: Abnormal   Collection Time: 03/21/19 12:59 AM  Result Value Ref Range   WBC 8.0 4.0 - 10.5 K/uL   RBC 4.26 3.87 - 5.11 MIL/uL   Hemoglobin 12.1 12.0 - 15.0 g/dL   HCT 35.4 (L) 36.0 - 46.0 %   MCV 83.1 80.0 - 100.0 fL   MCH 28.4 26.0 - 34.0 pg   MCHC 34.2 30.0 - 36.0 g/dL   RDW 14.6 11.5 - 15.5 %   Platelets 212 150 - 400 K/uL   nRBC 0.0 0.0 - 0.2 %   Comprehensive metabolic panel     Status: Abnormal   Collection Time: 03/21/19 12:59 AM  Result Value Ref Range   Sodium 137 135 - 145 mmol/L   Potassium 3.5 3.5 - 5.1 mmol/L   Chloride 107 98 - 111 mmol/L   CO2 20 (L) 22 - 32 mmol/L   Glucose, Bld 85 70 - 99 mg/dL   BUN 10 6 - 20 mg/dL   Creatinine, Ser 0.45 0.44 - 1.00 mg/dL   Calcium 8.9 8.9 - 10.3 mg/dL   Total Protein 6.3 (L) 6.5 - 8.1 g/dL   Albumin 3.3 (L) 3.5 - 5.0 g/dL   AST 22 15 - 41 U/L   ALT 12 0 - 44 U/L   Alkaline Phosphatase 124 38 - 126 U/L   Total Bilirubin 0.7 0.3 - 1.2 mg/dL   GFR calc non Af Amer >60 >60 mL/min   GFR calc Af Amer >60 >60 mL/min   Anion gap 10 5 - 15  Protein / creatinine ratio, urine     Status: Abnormal   Collection Time: 03/21/19 12:59 AM  Result Value Ref Range   Creatinine, Urine 122 mg/dL   Total Protein, Urine 28 mg/dL   Protein Creatinine Ratio 0.23 (H) 0.00 - 0.15 mg/mg[Cre]  Type and screen     Status: None   Collection Time: 03/21/19  2:08 AM  Result Value Ref Range   ABO/RH(D) O POS    Antibody Screen NEG    Sample Expiration      03/24/2019,2359 Performed at [facility], [address] [place], [place] [address]   Glucose, capillary     Status: None   Collection Time: 03/21/19  2:44 AM  Result Value Ref Range   Glucose-Capillary 86 70 - 99 mg/dL  Glucose, capillary     Status: None   Collection Time: 03/21/19  5:15 AM  Result Value Ref Range  Glucose-Capillary 82 70 - 99 mg/dL     Imaging:  US OB Follow Up Patient Name: MAKYIA ERXLEBEN DOB: [birth year] MRN: [id number] ULTRASOUND REPORT  Location: [facility] Date of Service: 03/14/2019   Indications:Hypertension Findings:  Nelda [place] intrauterine pregnancy is visualized with FHR at 130 BPM.  Biometrics give an (U/S) Gestational age of [redacted]w[redacted]d and an (U/S) EDD of  04/06/2019; this correlates with the clinically established Estimated Date  of Delivery: 03/30/19.  Fetal presentation is Cephalic.   Placenta: anterior. Grade: 2 AFI: 15.6 cm  Growth percentile is 47. EFW: 3157 g (6 lbs 15 oz)   Systolic and Diastolic blood flow in each umbilical artery appear normal  and without reversal or absence of diastolic flow.   The maximum S/D ratio is 2.00.   According to perinatology.com, this ratio is normal for this gestational  age.  Impression: 1. [redacted]w[redacted]d Viable Singleton Intrauterine pregnancy previously established  criteria. 2. Growth is 47 %ile.  AFI is 15.6 cm.   Recommendations: 1.Clinical correlation with the patient's History and Physical Exam.  Jenine  M. Albertine Grates     RDMS  I have reviewed this study and agree with documented findings.   Rubie Maid, MD [facility]   Assessment : Sonya Bauer is a 28 y.o. G2P1001 at [redacted]w[redacted]d being admitted for induction of labor due to chronic HTN with mild superimposed pre-eclampsia.  GDM Class A1. H/o HSV on suppressive therapy.   Plan: Labor: Induction as ordered as per protocol with Cytotec.  Is s/p 2nd dose. Next dose due at 0930.  Analgesia as needed.  Continue home meds (Aldomet) for BP. Hydralazine/Magnesium sulfate for severe range BPs.  Continue blood sugar checks q 4 hrs.   H/o HSV on suppressive therapy, no active lesions.  FWB: Reassuring fetal heart tracing.  GBS negative Delivery plan: Hopeful for vaginal delivery    Rubie Maid, MD [facility]

## 2019-03-22 LAB — CBC
HCT: 25.3 % — ABNORMAL LOW (ref 36.0–46.0)
Hemoglobin: 8.7 g/dL — ABNORMAL LOW (ref 12.0–15.0)
MCH: 29.1 pg (ref 26.0–34.0)
MCHC: 34.4 g/dL (ref 30.0–36.0)
MCV: 84.6 fL (ref 80.0–100.0)
Platelets: 175 10*3/uL (ref 150–400)
RBC: 2.99 MIL/uL — ABNORMAL LOW (ref 3.87–5.11)
RDW: 14.9 % (ref 11.5–15.5)
WBC: 8.8 10*3/uL (ref 4.0–10.5)
nRBC: 0 % (ref 0.0–0.2)

## 2019-03-22 LAB — RPR: RPR Ser Ql: NONREACTIVE

## 2019-03-22 MED ORDER — IBUPROFEN 600 MG PO TABS: 600.0000 mg | ORAL_TABLET | Freq: Four times a day (QID) | ORAL | 0 refills | Status: DC

## 2019-03-22 MED ORDER — AMLODIPINE BESYLATE 10 MG PO TABS
10.0000 mg | ORAL_TABLET | Freq: Every day | ORAL | Status: DC
  Administered 2019-03-22: 10 mg via ORAL
  Filled 2019-03-22: qty 1

## 2019-03-22 MED ORDER — DOCUSATE SODIUM 100 MG PO CAPS: 100.0000 mg | ORAL_CAPSULE | Freq: Two times a day (BID) | ORAL | 2 refills | Status: DC | PRN

## 2019-03-22 MED ORDER — AMLODIPINE BESYLATE 10 MG PO TABS: 10.0000 mg | ORAL_TABLET | Freq: Every day | ORAL | 3 refills | Status: DC

## 2019-03-22 MED ORDER — FERROUS SULFATE 325 (65 FE) MG PO TABS: 325.0000 mg | ORAL_TABLET | Freq: Two times a day (BID) | ORAL | 1 refills | Status: DC

## 2019-03-22 NOTE — Anesthesia Postprocedure Evaluation (Signed)
Anesthesia Post Note  Patient: Sonya Bauer  Procedure(s) Performed: AN AD [place]  Patient location during evaluation: Mother Baby Anesthesia Type: Epidural Level of consciousness: awake and alert Pain management: pain level controlled Vital Signs Assessment: post-procedure vital signs reviewed and stable Respiratory status: spontaneous breathing, nonlabored ventilation and respiratory function stable Cardiovascular status: stable Postop Assessment: no headache, no backache and epidural receding Anesthetic complications: no     Last Vitals:  Vitals:   03/22/19 0301 03/22/19 0748  BP: 120/78 119/88  Pulse: 86 72  Resp: 18 20  Temp: 36.6 C 36.7 C  SpO2: 100% 99%    Last Pain:  Vitals:   03/22/19 0748  TempSrc: Oral  PainSc:                  [name]

## 2019-03-22 NOTE — Discharge Instructions (Signed)
Discharge Instructions:   Follow-up Appointment: BP check June 17th at 2:45pm  6 week PP follow-up July 22nd at 10:45am  If there are any new medications, they have been ordered and will be available for pickup at the listed pharmacy on your way home from the hospital.   Call office if you have any of the following: headache, visual changes, fever >101.0 F, chills, shortness of breath, breast concerns, excessive vaginal bleeding, incision drainage or problems, leg pain or redness, depression or any other concerns. If you have vaginal discharge with an odor, let your doctor know.   It is normal to bleed for up to 6 weeks. You should not soak through more than 1 pad in 1 hour. If you have a blood clot larger than your fist with continued bleeding, call your doctor.   Activity: Do not lift > 10 lbs for 6 weeks (do not lift anything heavier than your baby). No intercourse, tampons, swimming pools, hot tubs, baths (only showers) for 6 weeks.  No driving for 1-2 weeks. Continue prenatal vitamin, especially if breastfeeding. Increase calories and fluids (water) while breastfeeding.   Your milk will come in, in the next couple of days (right now it is colostrum). You may have a slight fever when your milk comes in, but it should go away on its own.  If it does not, and rises above 101 F please call the doctor. You will also feel achy and your breasts will be firm. They will also start to leak. If you are breastfeeding, continue as you have been and you can pump/express milk for comfort.   If you have too much milk, your breasts can become engorged, which could lead to mastitis. This is an infection of the milk ducts. It can be very painful and you will need to notify your doctor to obtain a prescription for antibiotics. You can also treat it with a shower or hot/cold compress.   For concerns about your baby, please call your pediatrician.  For breastfeeding concerns, the lactation consultant can be  reached at [phone].   Postpartum blues (feelings of happy one minute and sad another minute) are normal for the first few weeks but if it gets worse let your doctor know.   Congratulations! We enjoyed caring for you and your new bundle of joy!

## 2019-03-22 NOTE — Progress Notes (Addendum)
Post Partum Day # 1, s/p SVD.  H/o cHTN with superimposed mild pre-eclampsia. GDM diet controlled  Subjective: no complaints, up ad lib, voiding and tolerating PO  Objective: Temp:  [97.9 F (36.6 C)-99.1 F (37.3 C)] 98 F (36.7 C) (06/10 0748) Pulse Rate:  [72-94] 72 (06/10 0748) Resp:  [17-20] 20 (06/10 0748) BP: (97-179)/(65-119) 119/88 (06/10 0748) SpO2:  [97 %-100 %] 99 % (06/10 0748)  Physical Exam:  General: alert and no distress  Lungs: clear to auscultation bilaterally Breasts: normal appearance, no masses or tenderness Heart: regular rate and rhythm, S1, S2 normal, no murmur, click, rub or gallop Abdomen: soft, non-tender; bowel sounds normal; no masses,  no organomegaly Pelvis: Lochia: appropriate, Uterine Fundus: firm Extremities: DVT Evaluation: No evidence of DVT seen on physical exam. Negative Homan's sign. No cords or calf tenderness. No significant calf/ankle edema.  Recent Labs    03/21/19 0059 03/22/19 0433  HGB 12.1 8.7*  HCT 35.4* 25.3*    Assessment/Plan: - Doing well postpartum. - Breastfeeding going well. - Contraception undecided but will likely use condoms.  - Anemia postpartum. Asymptomatic. Will need PO iron supplementation postpartum.  - Patient was on Aldomet during the pregnancy. She was previously on Norvasc 5 mg daily, Hydrodiuril 25 mg daily, and Lisinopril 40 mg daily. Patient's BPs have remained normal since delivery. Will discontinue Aldomet and place on Norvasc 10 mg daily.  Will f/u in 1 week in the office for BP check and gradually add back BP meds as needed.  - Patient was diet controlled GDM well controlled.  Can resume normal diet.  - Can d/c home later today if infant cleared for discharge.    LOS: 1 day   Rubie Maid, MD [facility] 03/22/2019 8:27 AM

## 2019-03-22 NOTE — Progress Notes (Signed)
Patient discharged home with infant. FOB present at discharge. Discharge instructions and prescriptions given and reviewed with patient. Patient verbalized understanding. Escorted out by staff.

## 2019-03-22 NOTE — Discharge Summary (Signed)
OB Discharge Summary     Patient Name: Sonya Bauer DOB: June 22, 1991 MRN: [id number]  Date of admission: 03/21/2019 Delivering MD: Rubie Maid   Date of discharge: 03/22/2019  Admitting diagnosis: Pregnancy - Induction Intrauterine pregnancy: [redacted]w[redacted]d     Secondary diagnosis:  Active Problems:   Chronic hypertension with superimposed pre-eclampsia  Gestational Diabetes (diet controlled)  Additional problems: History of HSV infection, on suppressive therapy     Discharge diagnosis: Term Pregnancy Delivered, CHTN with superimposed preeclampsia, GDM A1 and Anemia                                                                            Post partum procedures:None  Augmentation: AROM, Cytotec and Foley Balloon  Complications: None  Hospital course:  Induction of Labor With Vaginal Delivery   28 y.o. yo G2P1001 at [redacted]w[redacted]d was admitted to the hospital 03/21/2019 for induction of labor.  Indication for induction: A1 DM and Chronic HTN with superimposed mild pre-eclampsia.  Patient had an uncomplicated labor course as follows: Membrane Rupture Time/Date: 1:02 PM ,03/21/2019   Intrapartum Procedures: Episiotomy: None [1]                                         Lacerations:  None [1]  Patient had delivery of a Viable infant.  Information for the patient's newborn:  Cheryln, Balcom [[id number]]  Delivery Method: Vag-Spont   03/21/2019  Details of delivery can be found in separate delivery note.  Patient had a routine postpartum course. Patient is discharged home 03/22/19.  Physical exam  Vitals:   03/21/19 1950 03/21/19 2300 03/22/19 0301 03/22/19 0748  BP: 107/65 117/79 120/78 119/88  Pulse: 93 86 86 72  Resp: 18 18 18 20   Temp: 99.1 F (37.3 C) 99 F (37.2 C) 97.9 F (36.6 C) 98 F (36.7 C)  TempSrc: Oral Oral Oral Oral  SpO2: 98% 99% 100% 99%  Weight:      Height:       General: alert and no distress Lochia: appropriate Uterine Fundus: firm Incision: N/A DVT  Evaluation: No evidence of DVT seen on physical exam. Negative Homan's sign. No cords or calf tenderness. No significant calf/ankle edema. Labs: Lab Results  Component Value Date   WBC 8.8 03/22/2019   HGB 8.7 (L) 03/22/2019   HCT 25.3 (L) 03/22/2019   MCV 84.6 03/22/2019   PLT 175 03/22/2019   CMP Latest Ref Rng & Units 03/21/2019  Glucose 70 - 99 mg/dL 85  BUN 6 - 20 mg/dL 10  Creatinine 0.44 - 1.00 mg/dL 0.45  Sodium 135 - 145 mmol/L 137  Potassium 3.5 - 5.1 mmol/L 3.5  Chloride 98 - 111 mmol/L 107  CO2 22 - 32 mmol/L 20(L)  Calcium 8.9 - 10.3 mg/dL 8.9  Total Protein 6.5 - 8.1 g/dL 6.3(L)  Total Bilirubin 0.3 - 1.2 mg/dL 0.7  Alkaline Phos 38 - 126 U/L 124  AST 15 - 41 U/L 22  ALT 0 - 44 U/L 12    Discharge instruction: per After Visit Summary and "Baby and Me Booklet".  After visit  meds:  Allergies as of 03/22/2019      Reactions   Azithromycin Nausea And Vomiting      Medication List    STOP taking these medications   acetaminophen 500 MG tablet Commonly known as:  TYLENOL   aspirin EC 81 MG tablet   glucose blood test strip   methyldopa 250 MG tablet Commonly known as:  ALDOMET   pantoprazole 20 MG tablet Commonly known as:  Protonix   valACYclovir 500 MG tablet Commonly known as:  VALTREX     TAKE these medications   amLODipine 10 MG tablet Commonly known as:  NORVASC Take 1 tablet (10 mg total) by mouth daily.   Butalbital-APAP-Caffeine 50-325-40 MG capsule Take 1-2 capsules by mouth every 6 (six) hours as needed for headache.   docusate sodium 100 MG capsule Commonly known as:  COLACE Take 1 capsule (100 mg total) by mouth 2 (two) times daily as needed.   ferrous sulfate 325 (65 FE) MG tablet Commonly known as:  FerrouSul Take 1 tablet (325 mg total) by mouth 2 (two) times daily with a meal.   ibuprofen 600 MG tablet Commonly known as:  ADVIL Take 1 tablet (600 mg total) by mouth every 6 (six) hours.   loratadine 10 MG  tablet Commonly known as:  Claritin Take 1 tablet (10 mg total) by mouth daily.   PRENATAL VITAMIN PO Take 1 tablet by mouth daily.       Diet: routine diet  Activity: Advance as tolerated. Pelvic rest for 6 weeks.   Outpatient follow up:1 week for BP check Follow up Appt:No future appointments. Follow up Visit:No follow-ups on file.  Postpartum contraception: Condoms  Newborn Data: Live born female  Birth Weight: 7 lb 0.2 oz (3180 g) APGAR: 9, 9  Newborn Delivery   Birth date/time:  03/21/2019 14:39:00 Delivery type:  Vaginal, Spontaneous     Baby Feeding: Breast Disposition:home with mother   03/22/2019 Rubie Maid, MD

## 2019-03-24 ENCOUNTER — Encounter: Payer: Self-pay | Admitting: Obstetrics and Gynecology

## 2019-03-24 ENCOUNTER — Other Ambulatory Visit: Payer: Self-pay

## 2019-03-24 ENCOUNTER — Ambulatory Visit (INDEPENDENT_AMBULATORY_CARE_PROVIDER_SITE_OTHER): Payer: 59 | Admitting: Obstetrics and Gynecology

## 2019-03-24 ENCOUNTER — Telehealth: Payer: Self-pay

## 2019-03-24 DIAGNOSIS — I10 Essential (primary) hypertension: Secondary | ICD-10-CM

## 2019-03-24 DIAGNOSIS — O1404 Mild to moderate pre-eclampsia, complicating childbirth: Secondary | ICD-10-CM

## 2019-03-24 DIAGNOSIS — O1405 Mild to moderate pre-eclampsia, complicating the puerperium: Secondary | ICD-10-CM | POA: Diagnosis not present

## 2019-03-24 MED ORDER — HYDROCHLOROTHIAZIDE 25 MG PO TABS: 25.0000 mg | ORAL_TABLET | Freq: Every day | ORAL | 3 refills | Status: DC

## 2019-03-24 NOTE — Telephone Encounter (Signed)
Pt called to see if she could come into the office for a visit. Pt stated that she could.

## 2019-03-24 NOTE — Telephone Encounter (Signed)
Pt is s/p svd on 6/9.   Started on amlodipine 10 mg for htn at dc.  She is complaining of upper abd pain that radiates to her lower back. More so on the left side.   NO pain with urination. + BM. Passing gas.  VB is slowing down. Changing 2 x a day.   NO fever. NO H/a/ + for nausea.   Pt is taking bp med. BP last nite 160/104.   She is taking iubp 600 q6. Does not help with pain.   Advised tylenol and ibup. Heating pad. Will send message to [place].

## 2019-03-24 NOTE — Progress Notes (Signed)
    GYNECOLOGY PROGRESS NOTE  Subjective:    Patient ID: Sonya Bauer, female    DOB: 25-Feb-1991, 28 y.o.   MRN: [id number]  HPI  Patient is a 28 y.o. G67P1001 female who is 3 days postpartum with a h/o cHTN with mild pre-eclampsia who presents for complaints of worsening elevated BPs. She denies headaches, but does note blurred vision for several days and upper mid-abdominal discomfort.  She was changed from Aldomet during the pregnancy back to her pre-pregnancy BP medication of Norvasc. BPs were normal upon discharge from hospital. Denies SOB, chest pain. Notes normal lochia.   The following portions of the patient's history were reviewed and updated as appropriate: allergies, current medications, past family history, past medical history, past social history, past surgical history and problem list.  Review of Systems Pertinent items noted in HPI and remainder of comprehensive ROS otherwise negative.   Objective:   Blood pressure (!) 159/102, pulse (!) 109, height 5\' 5"  (1.651 m), weight 185 lb 1.6 oz (84 kg), currently breastfeeding.  General appearance: alert and no distress Abdomen: soft, non-tender; bowel sounds normal; no masses,  no organomegaly Pelvic: deferred Extremities: extremities normal, atraumatic, no cyanosis or edema Neurologic: Grossly normal   Assessment:   Postpartum state CHTN with superimposed mild pre-clampsia  Plan:   - New PIH labs ordered. Advised that if labs appear worsened, she would need to f/u at the hospital for admission for administration of 24 hrs of magnesium sulfate.  - If labs normal, patient to begin HCTZ in addition to her Norvasc (was previously on Norvasc, HCTZ, and Lisinopril prior to pregnancy; lisinopril not recommended in lactating women).  - To f/u in 3-4 days to repeat BP check if she does not require admission.    Rubie Maid, MD [facility]

## 2019-03-24 NOTE — Progress Notes (Signed)
Pt is present today due to having elevated BP after the birth of her baby. Pt is currently taking Amlodipine 10mg  once daily which pt stated that she has been taking as prescribed.

## 2019-03-25 ENCOUNTER — Encounter: Payer: Self-pay | Admitting: Obstetrics and Gynecology

## 2019-03-25 LAB — COMPREHENSIVE METABOLIC PANEL
ALT: 11 IU/L (ref 0–32)
AST: 16 IU/L (ref 0–40)
Albumin/Globulin Ratio: 1.7 (ref 1.2–2.2)
Albumin: 3.4 g/dL — ABNORMAL LOW (ref 3.9–5.0)
Alkaline Phosphatase: 114 IU/L (ref 39–117)
BUN/Creatinine Ratio: 13 (ref 9–23)
BUN: 8 mg/dL (ref 6–20)
Bilirubin Total: 0.2 mg/dL (ref 0.0–1.2)
CO2: 23 mmol/L (ref 20–29)
Calcium: 9.1 mg/dL (ref 8.7–10.2)
Chloride: 104 mmol/L (ref 96–106)
Creatinine, Ser: 0.61 mg/dL (ref 0.57–1.00)
GFR calc Af Amer: 144 mL/min/{1.73_m2} (ref >59)
GFR calc non Af Amer: 125 mL/min/{1.73_m2} (ref >59)
Globulin, Total: 2 g/dL (ref 1.5–4.5)
Glucose: 77 mg/dL (ref 65–99)
Potassium: 3.7 mmol/L (ref 3.5–5.2)
Sodium: 140 mmol/L (ref 134–144)
Total Protein: 5.4 g/dL — ABNORMAL LOW (ref 6.0–8.5)

## 2019-03-25 LAB — PROTEIN / CREATININE RATIO, URINE
Creatinine, Urine: 108.9 mg/dL
Protein, Ur: 15.9 mg/dL
Protein/Creat Ratio: 146 mg/g creat (ref 0–200)

## 2019-03-25 LAB — CBC
Hematocrit: 27.3 % — ABNORMAL LOW (ref 34.0–46.6)
Hemoglobin: 9.4 g/dL — ABNORMAL LOW (ref 11.1–15.9)
MCH: 29.3 pg (ref 26.6–33.0)
MCHC: 34.4 g/dL (ref 31.5–35.7)
MCV: 85 fL (ref 79–97)
Platelets: 273 10*3/uL (ref 150–450)
RBC: 3.21 x10E6/uL — ABNORMAL LOW (ref 3.77–5.28)
RDW: 14 % (ref 11.7–15.4)
WBC: 7.7 10*3/uL (ref 3.4–10.8)

## 2019-03-28 ENCOUNTER — Telehealth: Payer: Self-pay

## 2019-03-28 NOTE — Telephone Encounter (Signed)
Pt prescreened no symptoms has face mask.   Coronavirus (COVID-19) Are you at risk?  Are you at risk for the Coronavirus (COVID-19)?  To be considered HIGH RISK for Coronavirus (COVID-19), you have to meet the following criteria:  . Traveled to [place], [place], [place], [place] or [place]; or in the [place] to [place], [place], [place], or [place]; and have fever, cough, and shortness of breath within the last 2 weeks of travel OR . Been in close contact with a person diagnosed with COVID-19 within the last 2 weeks and have fever, cough, and shortness of breath . IF YOU DO NOT MEET THESE CRITERIA, YOU ARE CONSIDERED LOW RISK FOR COVID-19.  What to do if you are HIGH RISK for COVID-19?  . If you are having a medical emergency, call 911. . Seek medical care right away. Before you go to a doctor's office, urgent care or emergency department, call ahead and tell them about your recent travel, contact with someone diagnosed with COVID-19, and your symptoms. You should receive instructions from your physician's office regarding next steps of care.  . When you arrive at healthcare provider, tell the healthcare staff immediately you have returned from visiting [place], [place], [place], [place] or [place]; or traveled in the [place] to [place], [place], [place], or [place]; in the last two weeks or you have been in close contact with a person diagnosed with COVID-19 in the last 2 weeks.   . Tell the health care staff about your symptoms: fever, cough and shortness of breath. . After you have been seen by a medical provider, you will be either: o Tested for (COVID-19) and discharged home on quarantine except to seek medical care if symptoms worsen, and asked to  - Stay home and avoid contact with others until you get your results (4-5 days)  - Avoid travel on public transportation if possible (such as bus, train, or airplane) or o Sent to the Emergency Department by EMS for  evaluation, COVID-19 testing, and possible admission depending on your condition and test results.  What to do if you are LOW RISK for COVID-19?  Reduce your risk of any infection by using the same precautions used for avoiding the common cold or flu:  . Wash your hands often with soap and warm water for at least 20 seconds.  If soap and water are not readily available, use an alcohol-based hand sanitizer with at least 60% alcohol.  . If coughing or sneezing, cover your mouth and nose by coughing or sneezing into the elbow areas of your shirt or coat, into a tissue or into your sleeve (not your hands). . Avoid shaking hands with others and consider head nods or verbal greetings only. . Avoid touching your eyes, nose, or mouth with unwashed hands.  . Avoid close contact with people who are sick. . Avoid places or events with large numbers of people in one location, like concerts or sporting events. . Carefully consider travel plans you have or are making. . If you are planning any travel outside or inside the [place], visit the CDC's Travelers' Health webpage for the latest health notices. . If you have some symptoms but not all symptoms, continue to monitor at home and seek medical attention if your symptoms worsen. . If you are having a medical emergency, call 911.   ADDITIONAL HEALTHCARE OPTIONS FOR PATIENTS  [facility] / e-Visit: [email or web address]           [facility]: [phone]  [facility]: [phone]                   [facility]: [phone]  

## 2019-03-29 ENCOUNTER — Telehealth: Payer: Self-pay

## 2019-03-29 ENCOUNTER — Encounter: Payer: 59 | Admitting: Obstetrics and Gynecology

## 2019-03-29 NOTE — Telephone Encounter (Signed)
Pt called no answer LM that her appt with [facility] had to be canceled due to [facility] was still in surgery. Advised pt to call the office as soon as possible to reschedule her appointment.

## 2019-03-30 ENCOUNTER — Encounter: Payer: Self-pay | Admitting: Obstetrics and Gynecology

## 2019-03-30 ENCOUNTER — Other Ambulatory Visit: Payer: Self-pay

## 2019-03-30 ENCOUNTER — Ambulatory Visit (INDEPENDENT_AMBULATORY_CARE_PROVIDER_SITE_OTHER): Payer: 59 | Admitting: Obstetrics and Gynecology

## 2019-03-30 VITALS — BP 118/78 | HR 80 | Ht 65.0 in | Wt 172.2 lb

## 2019-03-30 DIAGNOSIS — Z0130 Encounter for examination of blood pressure without abnormal findings: Secondary | ICD-10-CM

## 2019-03-30 DIAGNOSIS — R399 Unspecified symptoms and signs involving the genitourinary system: Secondary | ICD-10-CM

## 2019-03-30 LAB — POCT URINALYSIS DIPSTICK
Glucose, UA: NEGATIVE
Ketones, UA: NEGATIVE
Nitrite, UA: NEGATIVE
Protein, UA: POSITIVE — AB
Spec Grav, UA: 1.015 (ref 1.010–1.025)
Urobilinogen, UA: 0.2 E.U./dL
pH, UA: 6 (ref 5.0–8.0)

## 2019-03-30 MED ORDER — NITROFURANTOIN MONOHYD MACRO 100 MG PO CAPS: 100.0000 mg | ORAL_CAPSULE | Freq: Two times a day (BID) | ORAL | 0 refills | Status: DC

## 2019-03-30 NOTE — Progress Notes (Signed)
I have reviewed the record and concur with patient management and plan. Patient is 1.5 weeks postpartm with h/o cHTN, on Norvasc (initated immediately postpartum), and resumed HCTZ several days ago after significant elevated BPs and headache noted.  BPs better controlled today. Patient with UTI symptoms, UA with protein, leukocytes, and blood (although is still experiencing postpartum bleeding).  Advised on increasing fluids, cranberry juice, and will send in prescription for Macrobid. Will send urine culture.    Rubie Maid, MD [facility]

## 2019-03-30 NOTE — Progress Notes (Signed)
Pt present today for blood pressure check. Blood pressure was 118/78. Pt stated that when she urinate that it hurts. Pt stated that her abd area hurts when she bends.

## 2019-03-31 ENCOUNTER — Telehealth: Payer: Self-pay | Admitting: Obstetrics and Gynecology

## 2019-03-31 NOTE — Telephone Encounter (Signed)
Roderic Ovens from [place] called and asked to speak with Roselyn Reef. She wad made aware that Roselyn Reef will not be in the office until Monday. Call back # [phone]

## 2019-04-03 NOTE — Telephone Encounter (Signed)
Tried to return call. Left voicemail to return call.

## 2019-04-04 NOTE — Telephone Encounter (Signed)
Dr. Adair Laundry would like a letter stating that patient was never taking out of work.

## 2019-04-10 ENCOUNTER — Telehealth: Payer: Self-pay | Admitting: Obstetrics and Gynecology

## 2019-04-10 NOTE — Telephone Encounter (Signed)
The patient called and stated that she needs a referral sen in by her provider so that she is able to be seen at [facility]. Please advise.

## 2019-04-12 ENCOUNTER — Other Ambulatory Visit: Payer: Self-pay

## 2019-04-12 DIAGNOSIS — R1010 Upper abdominal pain, unspecified: Secondary | ICD-10-CM

## 2019-04-13 ENCOUNTER — Other Ambulatory Visit (HOSPITAL_COMMUNITY): Payer: Self-pay | Admitting: Nurse Practitioner

## 2019-04-13 ENCOUNTER — Telehealth: Payer: Self-pay | Admitting: Obstetrics and Gynecology

## 2019-04-13 ENCOUNTER — Other Ambulatory Visit: Payer: Self-pay | Admitting: Nurse Practitioner

## 2019-04-13 DIAGNOSIS — R1084 Generalized abdominal pain: Secondary | ICD-10-CM

## 2019-04-13 MED ORDER — PHENAZOPYRIDINE HCL 200 MG PO TABS: 200.0000 mg | ORAL_TABLET | Freq: Three times a day (TID) | ORAL | 0 refills | Status: DC | PRN

## 2019-04-13 MED ORDER — KETOROLAC TROMETHAMINE 10 MG PO TABS: 10.0000 mg | ORAL_TABLET | Freq: Four times a day (QID) | ORAL | 0 refills | Status: DC | PRN

## 2019-04-13 NOTE — Telephone Encounter (Signed)
Spoke with pt about her pain pt stated that she having a lot of pelvic pain and pain with urination.

## 2019-04-13 NOTE — Telephone Encounter (Signed)
The patient called and stated that she never received a call back in regards to her UTI. The patient is requesting medication be sent in before the long weekend. Please advise.

## 2019-04-17 ENCOUNTER — Other Ambulatory Visit: Payer: Self-pay

## 2019-04-17 ENCOUNTER — Ambulatory Visit
Admission: RE | Admit: 2019-04-17 | Discharge: 2019-04-17 | Disposition: A | Discharge status: Home / Self Care | Payer: 59 | Source: Ambulatory Visit | Attending: Nurse Practitioner | Admitting: Nurse Practitioner

## 2019-04-17 DIAGNOSIS — R1084 Generalized abdominal pain: Secondary | ICD-10-CM | POA: Diagnosis not present

## 2019-04-19 NOTE — Telephone Encounter (Signed)
Pt called to see if she received a call for her referral. No answer LM via voicemail the reason for the call.

## 2019-05-03 ENCOUNTER — Encounter: Payer: Self-pay | Admitting: Obstetrics and Gynecology

## 2019-05-03 ENCOUNTER — Other Ambulatory Visit: Payer: Self-pay

## 2019-05-03 ENCOUNTER — Ambulatory Visit (INDEPENDENT_AMBULATORY_CARE_PROVIDER_SITE_OTHER): Payer: 59 | Admitting: Obstetrics and Gynecology

## 2019-05-03 DIAGNOSIS — R102 Pelvic and perineal pain: Secondary | ICD-10-CM

## 2019-05-03 DIAGNOSIS — I10 Essential (primary) hypertension: Secondary | ICD-10-CM

## 2019-05-03 DIAGNOSIS — R590 Localized enlarged lymph nodes: Secondary | ICD-10-CM

## 2019-05-03 DIAGNOSIS — O9081 Anemia of the puerperium: Secondary | ICD-10-CM

## 2019-05-03 NOTE — Progress Notes (Signed)
  PT is present today for her postpartum visit. Pt stated that she is breastfeeding and have not had sexually intercourse recently. Pt stated that she do not wish for birth control at this time. EPDS=5 .  Pt stated LMP 04/27/19 inserted a tampon and it hurt so bad she was scared to pull it out; pt stated removing the tampon was very painful; afterwards pt stated noticing an odor.

## 2019-05-03 NOTE — Progress Notes (Signed)
Subjective:     Sonya Bauer is a 28 y.o. female who presents for a postpartum visit. She is 6 weeks postpartum following a spontaneous vaginal birth. She is still breastfeeding, and plans to for at least 6 months. She's had to take two courses of medicine for thrush on her nipples, a cream provided by pediatrician; thrush started after the macrobid for the UTI. Just started her latest course. Baby hasn't gotten the thrush, so just treating pt for now. Healing fairly well from the delivery. Hasn't started having sex again yet. She started her period 7/16. Inserting and removing a tampon hurt (she was going swimming). It's already stopped, was pretty light. Not currently using anything for birth control. She's going to a gastro doctor due to stomach issues, so doesn't want to start anything yet. She has had bad upper abdominal pain- being worked up for possible ulcer vs gallbladder issue, per pt. She started having issues right before getting pregnant, and they've only gotten worse since then. Blood pressure seems to be improved- headaches have been better since restarting medications. Not sure if she wants to have another baby. She has two girls so far, but not sure if they want to try for a boy. She has had enlarged LN/loose skin since before pregnancy. The LN is smaller now, but skin hasn't returned to normal yet.  Review of Systems See HPI.  Objective:    BP 110/74   Pulse 79   Ht 5\' 5"  (1.651 m)   Wt 77.7 kg   LMP 04/27/2019   Breastfeeding Yes   BMI 28.49 kg/m   General:  alert, no distress   Breasts:  Slightly erythematous  Lungs: clear to auscultation bilaterally  Heart:  regular rate and rhythm, S1, S2 normal, no murmur, click, rub or gallop  Abdomen: soft, non-tender; bowel sounds normal; no masses,  no organomegaly   Vulva:  normal  Vagina: Positive for painful band of tissue at 6 o'clock  Cervix:  multiparous appearance  Corpus: not examined  Adnexa:  Right-sided LN slightly  enlarged   Rectal Exam: Not performed., normal external appearance        Assessment:   6 week postpartum exam following vaginal delivery.    Problem List Items Addressed This Visit    None    Visit Diagnoses    Postpartum care following vaginal delivery    -  Primary      Plan:     EPDS score 5 today.  Contraception: none, currently For hypertension, BP at goal; continue amlodipine and hctz as directed.  For enlarged lymph node, she will f/u if this doesn't resolve on its own. Pt is still breastfeeding.   Follow up as needed.   Marin Roberts, PA-S 05/03/19

## 2019-05-04 NOTE — Progress Notes (Signed)
   OBSTETRICS POSTPARTUM CLINIC PROGRESS NOTE  Subjective:     Sonya Bauer is a 28 y.o. G77P1001 female who presents for a postpartum visit. She is 6 weeks postpartum following a spontaneous vaginal delivery. I have fully reviewed the prenatal and intrapartum course. Pregnancy was complicated by chronic HTN with superimposed pre-eclampsia. The delivery was at 38.6 gestational weeks. Anesthesia: epidural. Postpartum course has been well, but complicated by 2 episodes of thrush on the breast. Baby's course has been well, has not developed thrush. Baby is feeding by breast. Bleeding: patient has not resumed menses, with Patient's last menstrual period was 04/27/2019.Marland Kitchen Bowel function is normal. Bladder function is normal. Patient is not sexually active. Contraception method desired is undecided currently, but may consider OCPs. Postpartum depression screening: negative, EPDS score is 5.   The following portions of the patient's history were reviewed and updated as appropriate: allergies, current medications, past family history, past medical history, past social history, past surgical history and problem list.  Review of Systems A comprehensive review of systems was negative except for: Integument/breast: positive for axillary lump/nodule present for over 1 week, has decreased in size, non-painful is on the right.  Patient also complains of discomfort in vaginal area with recently attempting to use a tampon.   Objective:    BP 110/74   Pulse 79   Ht 5\' 5"  (1.651 m)   Wt 171 lb 3.2 oz (77.7 kg)   LMP 04/27/2019   Breastfeeding Yes   BMI 28.49 kg/m   General:  alert and no distress   Breasts:  inspection negative, no nipple discharge or bleeding, no masses or nodularity palpable.  Mild swelling of right axillary region with small palpable nodule, ~ pea-sized, mobile, non-tender, no erythema.   Lungs: clear to auscultation bilaterally  Heart:  regular rate and rhythm, S1, S2 normal, no murmur,  click, rub or gallop  Abdomen: soft, non-tender; bowel sounds normal; no masses,  no organomegaly.     Vulva:  normal  Vagina: normal vagina, no discharge, exudate, lesion, or erythema. Mild tenderness at posterior forchette.   Cervix:  no cervical motion tenderness and no lesions  Corpus: normal size, contour, position, consistency, mobility, non-tender  Adnexa:  normal adnexa and no mass, fullness, tenderness  Rectal Exam: Not performed.         Labs:  Lab Results  Component Value Date   HGB 9.4 (L) 03/24/2019     Assessment:   Routine postpartum exam Chronic HTN Axillary nodule Postpartum anemia Pain related to vaginal delivery  Plan:    1. Contraception: undecided.  Is considering OCPs, but does not desire to initiate anything at this time while breastfeeding.  Will continue with lactational amenorrhea. Plans to continue breastfeeding for a total of 6 months.  2. Will check Hgb for h/o anemia.  3. CHTN - currently on amlodipine and HCTZ.  Will continue current medications. BP's well controlled.  4. Axillary nodule, likely benign, possible lymph node vs enlarged mammary duct. Advised on warm compresses to area, massage. Continue to monitor.  5. Pain related to vaginal activity, mostly in posterior fourchette. Patient did not experience any vaginal lacerations during delivery. Tight band noted with palpation. Discussed perineal massage with Vitamin E oil or mineral oil. If no improvement, can send to pelvic floor physical therapy.  6. Follow up in: 3 months or as needed.    Rubie Maid, MD [facility]

## 2019-05-10 ENCOUNTER — Other Ambulatory Visit: Payer: Self-pay

## 2019-05-10 DIAGNOSIS — R198 Other specified symptoms and signs involving the digestive system and abdomen: Secondary | ICD-10-CM

## 2019-07-13 ENCOUNTER — Other Ambulatory Visit: Payer: Self-pay | Admitting: *Deleted

## 2019-07-13 MED ORDER — FLUCONAZOLE 150 MG PO TABS: 150.0000 mg | ORAL_TABLET | Freq: Once | ORAL | 3 refills | Status: AC

## 2019-07-27 ENCOUNTER — Other Ambulatory Visit: Payer: Self-pay

## 2019-07-27 ENCOUNTER — Other Ambulatory Visit (INDEPENDENT_AMBULATORY_CARE_PROVIDER_SITE_OTHER): Payer: 59

## 2019-07-27 ENCOUNTER — Telehealth: Payer: Self-pay | Admitting: Obstetrics and Gynecology

## 2019-07-27 DIAGNOSIS — R300 Dysuria: Secondary | ICD-10-CM

## 2019-07-27 LAB — POCT URINALYSIS DIPSTICK
Bilirubin, UA: NEGATIVE
Blood, UA: NEGATIVE
Glucose, UA: NEGATIVE
Ketones, UA: NEGATIVE
Nitrite, UA: NEGATIVE
Protein, UA: NEGATIVE
Spec Grav, UA: >=1.03 — AB (ref 1.010–1.025)
Urobilinogen, UA: 0.2 E.U./dL
pH, UA: 6 (ref 5.0–8.0)

## 2019-07-27 NOTE — Telephone Encounter (Signed)
The patient called and stated that she is wanting to come in this afternoon to do a urine drop off. Pt believes she may have a UTI. Pt informed nurse has to approve appointment, Pt voiced understanding. Please advise.

## 2019-07-28 NOTE — Telephone Encounter (Signed)
Pt was scheduled.   Thank you,  TH

## 2019-07-29 LAB — URINE CULTURE

## 2019-07-31 DIAGNOSIS — K9041 Non-celiac gluten sensitivity: Secondary | ICD-10-CM

## 2019-07-31 HISTORY — DX: Non-celiac gluten sensitivity: K90.41

## 2019-08-18 ENCOUNTER — Encounter: Payer: 59 | Admitting: Obstetrics and Gynecology

## 2019-08-30 ENCOUNTER — Ambulatory Visit (INDEPENDENT_AMBULATORY_CARE_PROVIDER_SITE_OTHER): Payer: 59 | Admitting: Certified Nurse Midwife

## 2019-08-30 ENCOUNTER — Other Ambulatory Visit (HOSPITAL_COMMUNITY)
Admission: RE | Admit: 2019-08-30 | Discharge: 2019-08-30 | Disposition: A | Discharge status: Home / Self Care | Payer: 59 | Source: Ambulatory Visit | Attending: Certified Nurse Midwife | Admitting: Certified Nurse Midwife

## 2019-08-30 ENCOUNTER — Encounter: Payer: Self-pay | Admitting: Certified Nurse Midwife

## 2019-08-30 ENCOUNTER — Other Ambulatory Visit: Payer: Self-pay

## 2019-08-30 VITALS — BP 115/95 | HR 101 | Ht 65.0 in | Wt 176.0 lb

## 2019-08-30 DIAGNOSIS — N898 Other specified noninflammatory disorders of vagina: Secondary | ICD-10-CM | POA: Insufficient documentation

## 2019-08-30 DIAGNOSIS — Z13220 Encounter for screening for lipoid disorders: Secondary | ICD-10-CM

## 2019-08-30 DIAGNOSIS — R5383 Other fatigue: Secondary | ICD-10-CM

## 2019-08-30 DIAGNOSIS — Z01419 Encounter for gynecological examination (general) (routine) without abnormal findings: Secondary | ICD-10-CM | POA: Diagnosis not present

## 2019-08-30 DIAGNOSIS — Z136 Encounter for screening for cardiovascular disorders: Secondary | ICD-10-CM

## 2019-08-30 NOTE — Patient Instructions (Signed)
Preventive Care 21-28 Years Old, Female Preventive care refers to visits with your health care provider and lifestyle choices that can promote health and wellness. This includes:  A yearly physical exam. This may also be called an annual well check.  Regular dental visits and eye exams.  Immunizations.  Screening for certain conditions.  Healthy lifestyle choices, such as eating a healthy diet, getting regular exercise, not using drugs or products that contain nicotine and tobacco, and limiting alcohol use. What can I expect for my preventive care visit? Physical exam Your health care provider will check your:  Height and weight. This may be used to calculate body mass index (BMI), which tells if you are at a healthy weight.  Heart rate and blood pressure.  Skin for abnormal spots. Counseling Your health care provider may ask you questions about your:  Alcohol, tobacco, and drug use.  Emotional well-being.  Home and relationship well-being.  Sexual activity.  Eating habits.  Work and work environment.  Method of birth control.  Menstrual cycle.  Pregnancy history. What immunizations do I need?  Influenza (flu) vaccine  This is recommended every year. Tetanus, diphtheria, and pertussis (Tdap) vaccine  You may need a Td booster every 10 years. Varicella (chickenpox) vaccine  You may need this if you have not been vaccinated. Human papillomavirus (HPV) vaccine  If recommended by your health care provider, you may need three doses over 6 months. Measles, mumps, and rubella (MMR) vaccine  You may need at least one dose of MMR. You may also need a second dose. Meningococcal conjugate (MenACWY) vaccine  One dose is recommended if you are age 19-21 years and a first-year college student living in a residence hall, or if you have one of several medical conditions. You may also need additional booster doses. Pneumococcal conjugate (PCV13) vaccine  You may need  this if you have certain conditions and were not previously vaccinated. Pneumococcal polysaccharide (PPSV23) vaccine  You may need one or two doses if you smoke cigarettes or if you have certain conditions. Hepatitis A vaccine  You may need this if you have certain conditions or if you travel or work in places where you may be exposed to hepatitis A. Hepatitis B vaccine  You may need this if you have certain conditions or if you travel or work in places where you may be exposed to hepatitis B. Haemophilus influenzae type b (Hib) vaccine  You may need this if you have certain conditions. You may receive vaccines as individual doses or as more than one vaccine together in one shot (combination vaccines). Talk with your health care provider about the risks and benefits of combination vaccines. What tests do I need?  Blood tests  Lipid and cholesterol levels. These may be checked every 5 years starting at age 20.  Hepatitis C test.  Hepatitis B test. Screening  Diabetes screening. This is done by checking your blood sugar (glucose) after you have not eaten for a while (fasting).  Sexually transmitted disease (STD) testing.  BRCA-related cancer screening. This may be done if you have a family history of breast, ovarian, tubal, or peritoneal cancers.  Pelvic exam and Pap test. This may be done every 3 years starting at age 21. Starting at age 30, this may be done every 5 years if you have a Pap test in combination with an HPV test. Talk with your health care provider about your test results, treatment options, and if necessary, the need for more tests.   Follow these instructions at home: Eating and drinking   Eat a diet that includes fresh fruits and vegetables, whole grains, lean protein, and low-fat dairy.  Take vitamin and mineral supplements as recommended by your health care provider.  Do not drink alcohol if: ? Your health care provider tells you not to drink. ? You are  pregnant, may be pregnant, or are planning to become pregnant.  If you drink alcohol: ? Limit how much you have to 0-1 drink a day. ? Be aware of how much alcohol is in your drink. In the [place] one drink equals one 12 oz bottle of beer (355 mL), one 5 oz glass of wine (148 mL), or one 1 oz glass of hard liquor (44 mL). Lifestyle  Take daily care of your teeth and gums.  Stay active. Exercise for at least 30 minutes on 5 or more days each week.  Do not use any products that contain nicotine or tobacco, such as cigarettes, e-cigarettes, and chewing tobacco. If you need help quitting, ask your health care provider.  If you are sexually active, practice safe sex. Use a condom or other form of birth control (contraception) in order to prevent pregnancy and STIs (sexually transmitted infections). If you plan to become pregnant, see your health care provider for a preconception visit. What's next?  Visit your health care provider once a year for a well check visit.  Ask your health care provider how often you should have your eyes and teeth checked.  Stay up to date on all vaccines. This information is not intended to replace advice given to you by your health care provider. Make sure you discuss any questions you have with your health care provider. Document Released: 11/24/2001 Document Revised: 06/09/2018 Document Reviewed: 06/09/2018 [employer]vier Patient Education  2020 [employer]  

## 2019-08-30 NOTE — Progress Notes (Signed)
GYNECOLOGY ANNUAL PREVENTATIVE CARE ENCOUNTER NOTE  History:     Sonya Bauer is a 28 y.o. G53P1001 female here for a routine annual gynecologic exam.  Current complaints: fatigue, weight gain, heavy period, vaginal odor.  Pt states that she was recently on antibiotics for H pylori treatment.  Denies abnormal vaginal bleeding, discharge, pelvic pain, problems with intercourse or other gynecologic concerns.      Married, 2 children Does not work Exercise: just started back up 3-4 wk x 30 min No drugs, alcohol, smoking, vaping   Gynecologic History Patient's last menstrual period was 08/28/2019 (exact date). Contraception: none, is not decided on BC at this time  Last Pap: 11/11/2016 Results were: normal Last mammogram: n/a.  Obstetric History OB History  Gravida Para Term Preterm AB Living  2 1 1     1   SAB TAB Ectopic Multiple Live Births        0 1    # Outcome Date GA Lbr Len/2nd Weight Sex Delivery Anes PTL Lv  2 Gravida           1 Term 07/02/15 [redacted]w[redacted]d 05:50 / 00:14 6 lb 6.7 oz (2.91 kg) F Vag-Spont EPI  LIV    Past Medical History:  Diagnosis Date  . Complication of anesthesia   . Depression   . Family history of adverse reaction to anesthesia    MOM-HAD SEIZURE DURING SURGERY   . GERD (gastroesophageal reflux disease)    EVERY OTHER DAY  . Gestational diabetes   . Headache   . Heart murmur    ASYMPTOMATIC  . Heartburn   . History of kidney stones   . Hypertension   . Kidney stones   . Motion sickness    long trips  . Pregnancy induced hypertension   . 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 CYSTOSCOPY WITH STENT PLACEMENT Left 10/06/2016   Procedure: CYSTOSCOPY WITH STENT PLACEMENT;  Surgeon: Alexis Frock, MD;  Location: [facility];  Service: Urology;  Laterality: Left;  . TONSILLECTOMY AND ADENOIDECTOMY N/A 05/20/2018   Procedure: TONSILLECTOMY;  Surgeon: Beverly Gust, MD;  Location: [place];   Service: ENT;  Laterality: N/A;  . URETEROSCOPY WITH HOLMIUM LASER LITHOTRIPSY Left 10/06/2016   Procedure: URETEROSCOPY WITH HOLMIUM LASER LITHOTRIPSY;  Surgeon: Alexis Frock, MD;  Location: [facility];  Service: Urology;  Laterality: Left;  . WISDOM TOOTH EXTRACTION     AGE 80    Current Outpatient Medications on File Prior to Visit  Medication Sig Dispense Refill  . amLODipine (NORVASC) 10 MG tablet Take 1 tablet (10 mg total) by mouth daily. 90 tablet 3  . Butalbital-APAP-Caffeine 50-325-40 MG capsule Take 1-2 capsules by mouth every 6 (six) hours as needed for headache. 30 capsule 3  . hydrochlorothiazide (HYDRODIURIL) 25 MG tablet Take 1 tablet (25 mg total) by mouth daily. 30 tablet 3  . ibuprofen (ADVIL) 600 MG tablet Take 1 tablet (600 mg total) by mouth every 6 (six) hours. 30 tablet 0  . loratadine (CLARITIN) 10 MG tablet Take 1 tablet (10 mg total) by mouth daily. 30 tablet 4  . omeprazole (PRILOSEC) 20 MG capsule TK 1 C PO ONCE A DAY 30 MIN B MORNING MEAL    . docusate sodium (COLACE) 100 MG capsule Take 1 capsule (100 mg total) by mouth 2 (two) times daily as needed. 30 capsule 2  . Prenatal Vit-Fe Fumarate-FA (PRENATAL VITAMIN PO) Take 1 tablet by mouth daily.  No current facility-administered medications on file prior to visit.     Allergies  Allergen Reactions  . Azithromycin Nausea And Vomiting    Social History:  reports that she has never smoked. She has never used smokeless tobacco. She reports previous alcohol use. She reports that she does not use drugs.  Family History  Problem Relation Age of Onset  . Hypertension Father   . Asthma Mother   . Diabetes Maternal Aunt   . Diabetes Maternal Grandmother   . Kidney disease Neg Hx   . Bladder Cancer Neg Hx     The following portions of the patient's history were reviewed and updated as appropriate: allergies, current medications, past family history, past medical history, past social history, past surgical  history and problem list.  Review of Systems Pertinent items noted in HPI and remainder of comprehensive ROS otherwise negative.  Physical Exam:  BP (!) 115/95   Pulse (!) 101   Ht 5\' 5"  (1.651 m)   Wt 176 lb (79.8 kg)   LMP 08/28/2019 (Exact Date)   BMI 29.29 kg/m  CONSTITUTIONAL: Well-developed, well-nourished, over weight female in no acute distress.  HENT:  Normocephalic, atraumatic, External right and left ear normal. Oropharynx is clear and moist EYES: Conjunctivae and EOM are normal. Pupils are equal, round, and reactive to light. No scleral icterus.  NECK: Normal range of motion, supple, no masses.  Normal thyroid.  SKIN: Skin is warm and dry. No rash noted. Not diaphoretic. No erythema. No pallor. MUSCULOSKELETAL: Normal range of motion. No tenderness.  No cyanosis, clubbing, or edema.  2+ distal pulses. NEUROLOGIC: Alert and oriented to person, place, and time. Normal reflexes, muscle tone coordination.  PSYCHIATRIC: Normal mood and affect. Normal behavior. Normal judgment and thought content. CARDIOVASCULAR: Normal heart rate noted, regular rhythm RESPIRATORY: Clear to auscultation bilaterally. Effort and breath sounds normal, no problems with respiration noted. BREASTS: Symmetric in size. No masses, tenderness, skin changes, nipple drainage, or lymphadenopathy bilaterally. ABDOMEN: Soft, no distention noted.  No tenderness, rebound or guarding.  PELVIC: Normal appearing external genitalia and urethral meatus; normal appearing vaginal mucosa and cervix.  No abnormal discharge noted. Pt on her period-blood present. Pap smear not indicated.  Normal uterine size, no other palpable masses, no uterine or adnexal tenderness.   Assessment and Plan:   Annual [facility] Women GYN Exam . Pap smear not due Mammogram not indicated Labs: CBC, ferritin, TSH, lipid profile , vaginal swab Refills: none Sample of boric acid given, will follow up with results. Discussed BC to help control  menstrual cycle-booklet given.  BP slightly elevated, state she is going to her PCP and is going to switch back to the medication she was taking previously as it controlled BP better. Red flag symptoms reviewed.  Pt is interested in weight loss management. Discussed that BP is concern. Request that she have clarence from her PCP and better BP control prior to starting.  Routine preventative health maintenance measures emphasized. Please refer to After Visit Summary for other counseling recommendations.      Philip Aspen, CNM

## 2019-08-31 LAB — CBC
Hematocrit: 39 % (ref 34.0–46.6)
Hemoglobin: 13.4 g/dL (ref 11.1–15.9)
MCH: 28.3 pg (ref 26.6–33.0)
MCHC: 34.4 g/dL (ref 31.5–35.7)
MCV: 82 fL (ref 79–97)
Platelets: 382 10*3/uL (ref 150–450)
RBC: 4.74 x10E6/uL (ref 3.77–5.28)
RDW: 13.3 % (ref 11.7–15.4)
WBC: 7 10*3/uL (ref 3.4–10.8)

## 2019-08-31 LAB — LIPID PANEL
Chol/HDL Ratio: 4.6 ratio — ABNORMAL HIGH (ref 0.0–4.4)
Cholesterol, Total: 210 mg/dL — ABNORMAL HIGH (ref 100–199)
HDL: 46 mg/dL (ref >39)
LDL Chol Calc (NIH): 129 mg/dL — ABNORMAL HIGH (ref 0–99)
Triglycerides: 195 mg/dL — ABNORMAL HIGH (ref 0–149)
VLDL Cholesterol Cal: 35 mg/dL (ref 5–40)

## 2019-08-31 LAB — FERRITIN: Ferritin: 47 ng/mL (ref 15–150)

## 2019-08-31 LAB — TSH: TSH: 1.89 u[IU]/mL (ref 0.450–4.500)

## 2019-09-01 LAB — CERVICOVAGINAL ANCILLARY ONLY
Bacterial Vaginitis (gardnerella): NEGATIVE
Candida Glabrata: NEGATIVE
Candida Vaginitis: NEGATIVE
Comment: NEGATIVE
Comment: NEGATIVE
Comment: NEGATIVE

## 2019-10-02 ENCOUNTER — Other Ambulatory Visit: Payer: Self-pay | Admitting: Obstetrics and Gynecology

## 2019-11-04 IMAGING — US US RENAL
1 series · 14 of 25 positions shown · non-contrast
Comparison: CT abdomen pelvis of 06/01/2016

CLINICAL DATA: The patient is 15 weeks pregnant, now with right
flank pain and history of kidney stones

EXAM:
RENAL / URINARY TRACT ULTRASOUND COMPLETE

[Series 1: us renal · 0.25mm/px · 14 of 43 slices shown]
[im 1/43]
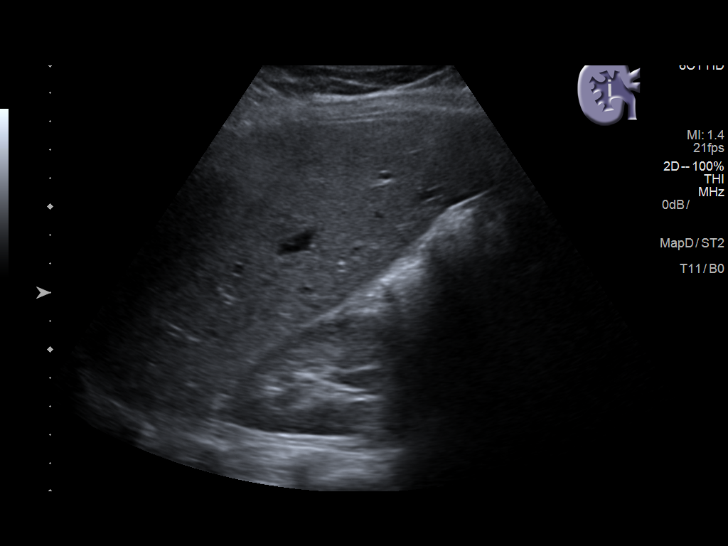
[im 4/43]
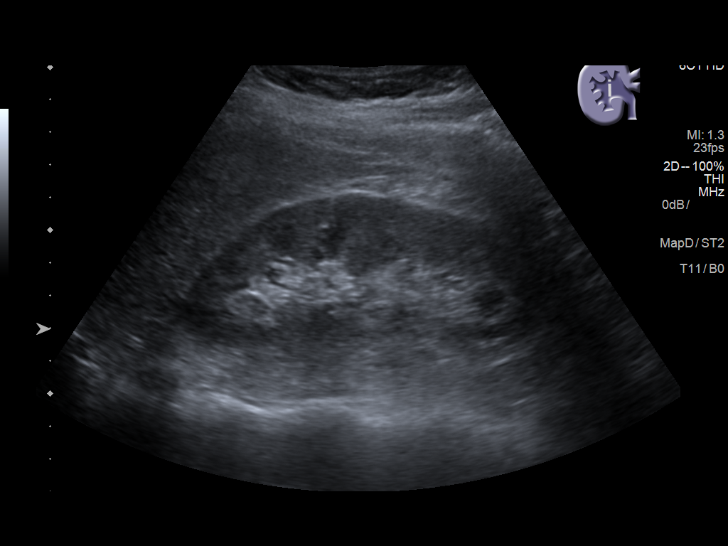
[im 8/43]
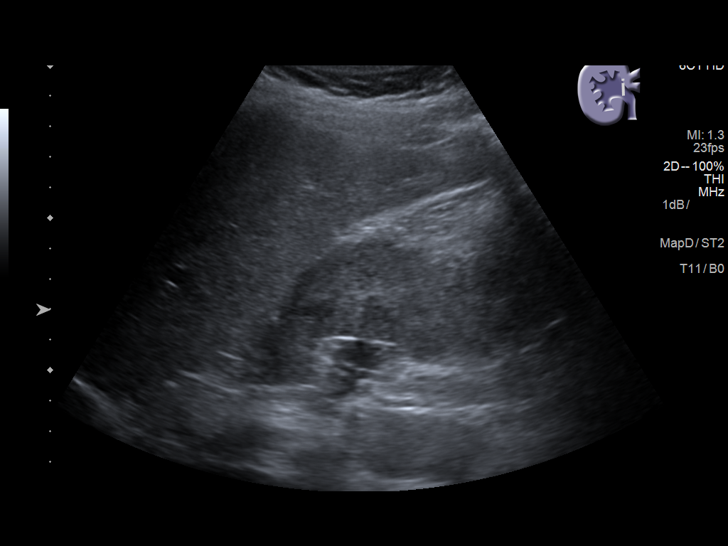
[im 11/43]
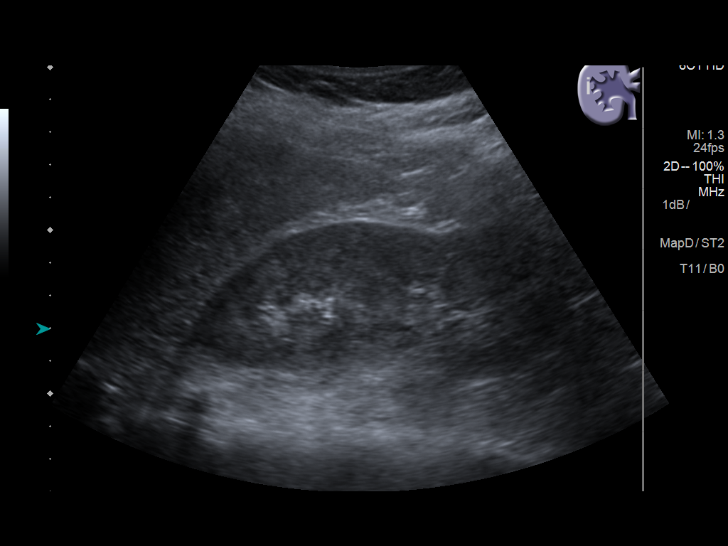
[im 15/43]
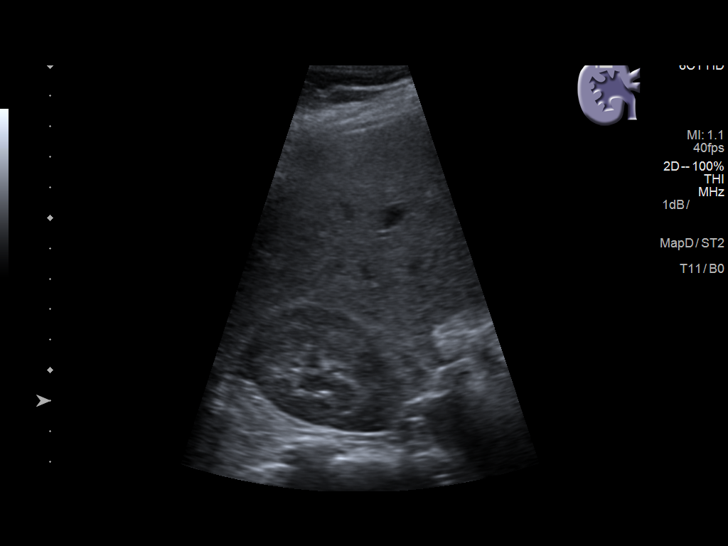
[im 16/43]
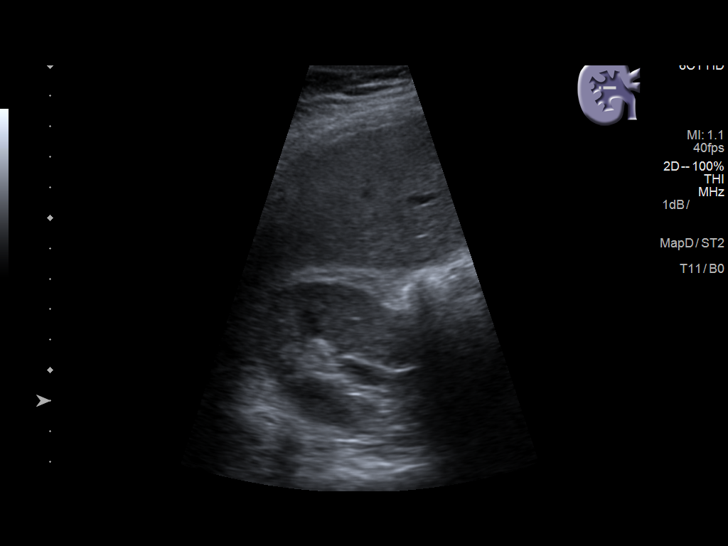
[im 20/43]
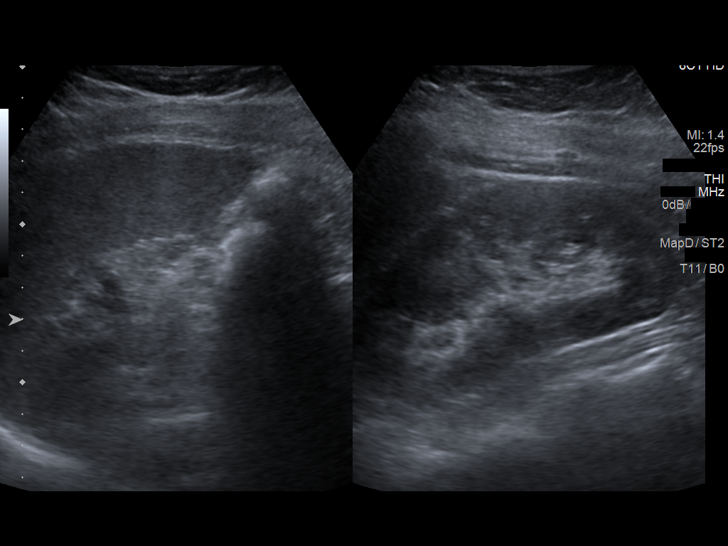
[im 23/43]
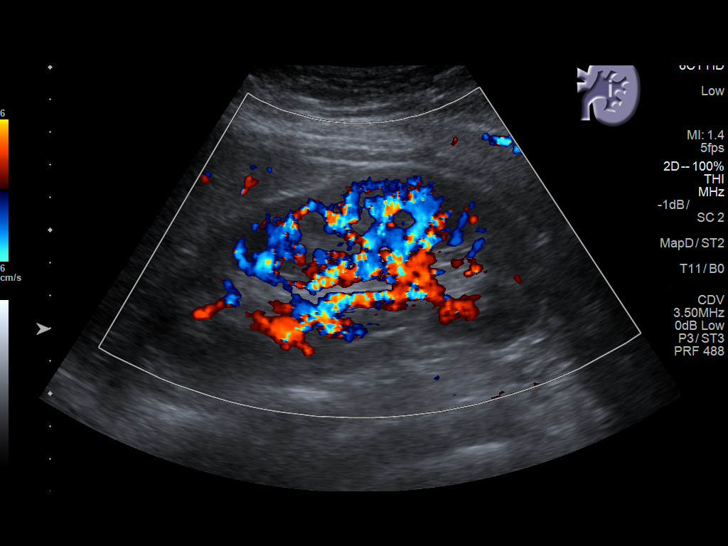
[im 27/43]
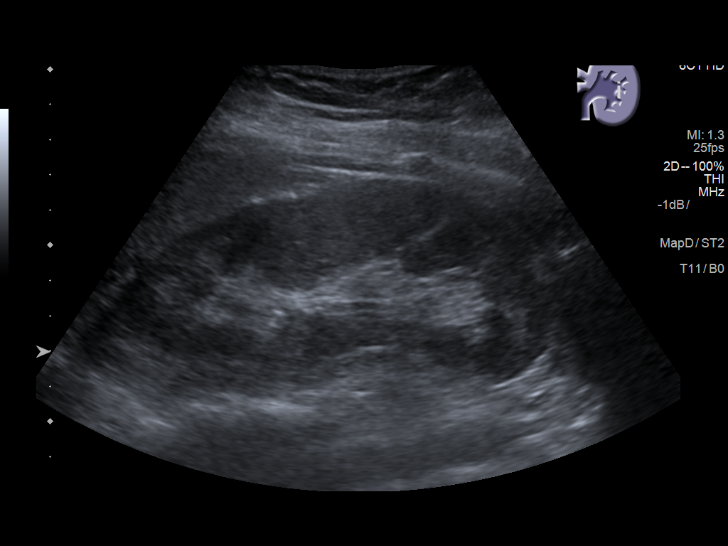
[im 29/43]
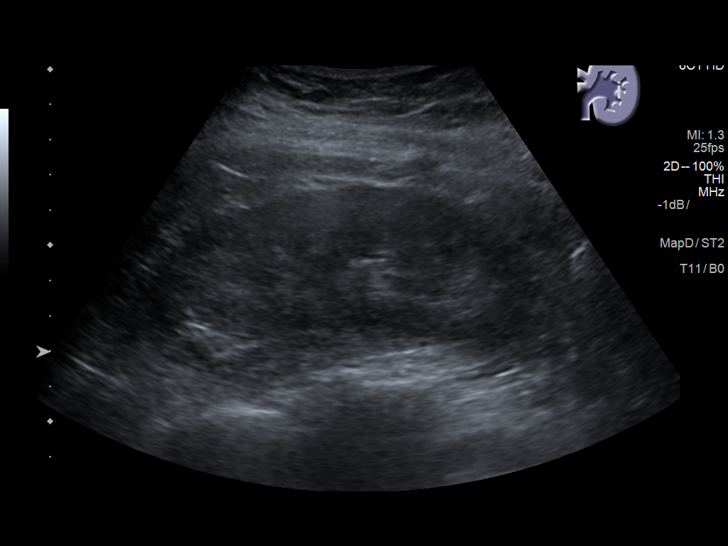
[im 32/43]
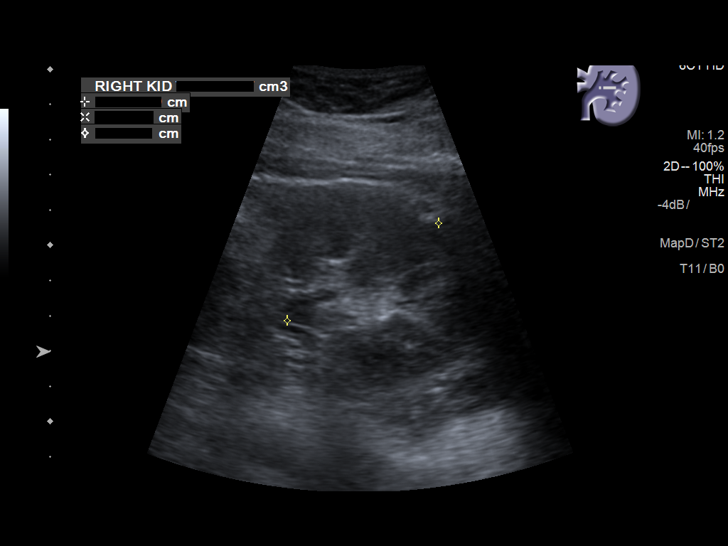
[im 36/43]
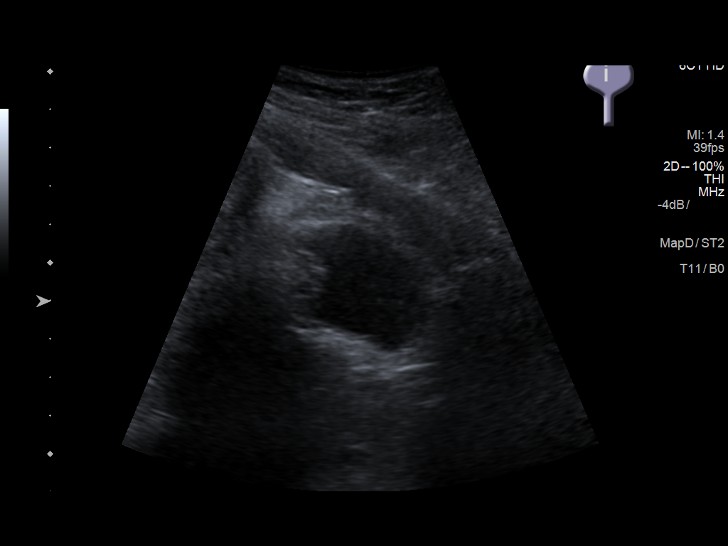
[im 39/43]
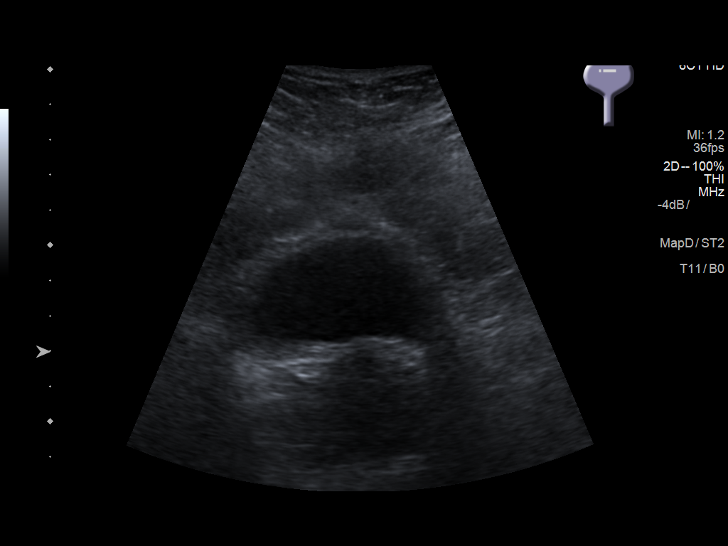
[im 43/43]
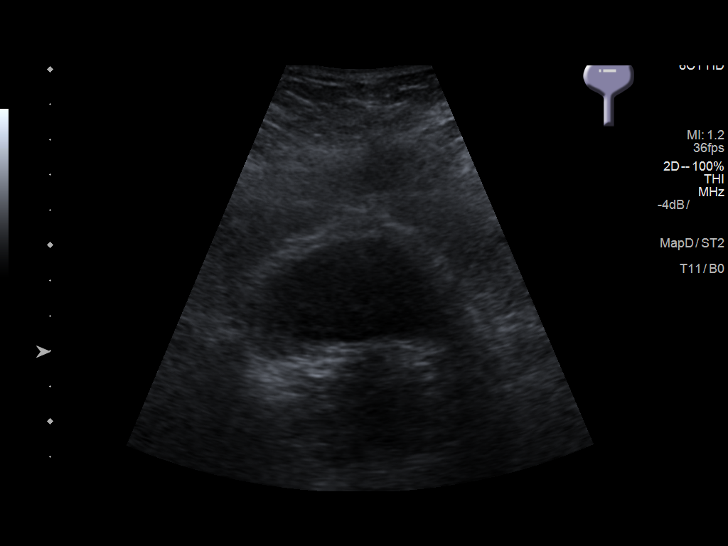

[14 of 25 positions shown; findings below may reference images not displayed]

FINDINGS: Right Kidney:

Renal measurements: 11.2 x 4.8 x 5.3 cm. = volume: 147 ML. The
echogenicity of the right kidney is unremarkable. No renal calculi
are seen.

Left Kidney:

Renal measurements: 12.7 x 5.4 x 5.1 cm. = volume: 183 mL. The
echogenicity of the left kidney is unremarkable. No renal calculus
is noted.

Bladder:

Urinary bladder is not well distended but no abnormality is seen.
IMPRESSION: 1. No hydronephrosis.  No renal calculi are noted by ultrasound.
2. The urinary bladder is not well distended but no abnormality is
seen.

## 2019-11-22 ENCOUNTER — Encounter: Payer: 59 | Admitting: Dietician

## 2019-11-30 ENCOUNTER — Telehealth: Payer: Self-pay | Admitting: Plastic Surgery

## 2019-11-30 NOTE — Telephone Encounter (Signed)

## 2019-12-01 ENCOUNTER — Institutional Professional Consult (permissible substitution): Payer: 59 | Admitting: Plastic Surgery

## 2019-12-13 ENCOUNTER — Other Ambulatory Visit: Payer: Self-pay

## 2019-12-13 ENCOUNTER — Encounter: Payer: 59 | Attending: Gastroenterology | Admitting: Dietician

## 2019-12-13 ENCOUNTER — Encounter: Payer: Self-pay | Admitting: Dietician

## 2019-12-13 DIAGNOSIS — K9041 Non-celiac gluten sensitivity: Secondary | ICD-10-CM | POA: Insufficient documentation

## 2019-12-13 NOTE — Progress Notes (Signed)
Medical Nutrition Therapy   Primary concerns today: gluten-free diet, weight management   Referral diagnosis: K90.0- celiac disease  Preferred learning style: no preference indicated Learning readiness: change in progress   NUTRITION ASSESSMENT   Clinical Medical Hx: depression, GERD, GDM, heartburn, kidney stones, HTN, pregnancy induced HTN, thyroid cyst, gluten intolerance (weak positive for celiac disease), H. pylori, hiatal hernia   Notable Signs/Symptoms: abdominal pain  Lifestyle & Dietary Hx Typical meal pattern is 3 meals per day, does not snack. Avoids acidic foods d/t acid reflux including tomatoes, soda/carbonation, and milk. Avoids some dairy but can do cheese. Does not handle red meat well, only eats ~1x/week. Consumes well balanced diet, but states it is difficult trying to follow a gluten free diet when her other family members in the household do not. States that, since excluding most gluten from her diet, she has noticed less abdominal pain and less diarrhea. Drinks only water, but does not drink a lot of it.   Estimated daily fluid intake: 30-40 oz Supplements: vitamin D3 (looking into B-complex)  Current average weekly physical activity: walking when weather is nice; general light activity; keeping up with kids; yoga, stretching   24-Hr Dietary Recall First Meal: eggs  Snack: - Second Meal: deli meat + frozen vegetables  Snack: - Third Meal: gluten-free pasta dish  Snack: - Beverages: water, occasionally soda   Estimated Energy Needs Calories: 2000-2200 Carbohydrate: 225-248g Protein: 125-138g Fat: 67-73g   NUTRITION DIAGNOSIS  Altered GI function ([place]-1.4) related to gluten intolerance as evidenced by symptoms including abdominal pain and diarrhea after consumption of gluten-containing foods, and weak positive TTG (t-Transglutaminase) IgA test result of 5 U/mL   NUTRITION INTERVENTION  Nutrition education (E-1) on the following topics:  . Gluten Free Diet  Nutrition Therapy   Handouts Provided Include   Celiac Disease Healthy Eating Tips   Celiac Disease Label Reading Tips   Learning Style & Readiness for Change Teaching method utilized: Visual & Auditory  Demonstrated degree of understanding via: Teach Back  Barriers to learning/adherence to lifestyle change: None Identified   Goals Established by Pt . To increase water intake  . To adhere to gluten-free diet as much as possible  . To increase fiber intake    MONITORING & EVALUATION Dietary intake, weekly physical activity, and goals prn.  Next Steps  Patient is to contact [facility] to schedule follow up visit as needed/desired.

## 2019-12-13 NOTE — Patient Instructions (Addendum)
Remember to try and drink as much water as possible throughout the day. The goal is a minimum of 64 ounces daily. Try having water with you at all times, drinking it at the temperature you like, using water apps as reminders, and using reusable water bottles.   Try to adhere to a gluten-free diet as much as possible. Use the handouts from today for tips and guides on food ideas and what to look out for on nutrition labels. Looking up gluten-free recipes can give more ideas too!  Aim to eat as much fiber as possible. Fiber is found in plant foods, such as fruits, vegetables, beans, and whole grains such as brown rice, oats, and quinoa (include all grains except for wheat, barley, and rye.)

## 2020-01-03 ENCOUNTER — Other Ambulatory Visit: Payer: Self-pay

## 2020-01-03 ENCOUNTER — Encounter: Payer: Self-pay | Admitting: Certified Nurse Midwife

## 2020-01-03 ENCOUNTER — Ambulatory Visit: Payer: 59 | Admitting: Certified Nurse Midwife

## 2020-01-03 VITALS — BP 125/98 | HR 95 | Ht 65.0 in | Wt 162.4 lb

## 2020-01-03 DIAGNOSIS — F419 Anxiety disorder, unspecified: Secondary | ICD-10-CM

## 2020-01-03 DIAGNOSIS — Z3200 Encounter for pregnancy test, result unknown: Secondary | ICD-10-CM

## 2020-01-03 DIAGNOSIS — Z30430 Encounter for insertion of intrauterine contraceptive device: Secondary | ICD-10-CM | POA: Diagnosis not present

## 2020-01-03 NOTE — Patient Instructions (Signed)
Intrauterine Device Insertion, Care After  This sheet gives you information about how to care for yourself after your procedure. Your health care provider may also give you more specific instructions. If you have problems or questions, contact your health care provider. What can I expect after the procedure? After the procedure, it is common to have:  Cramps and pain in the abdomen.  Light bleeding (spotting) or heavier bleeding that is like your menstrual period. This may last for up to a few days.  Lower back pain.  Dizziness.  Headaches.  Nausea. Follow these instructions at home:  Before resuming sexual activity, check to make sure that you can feel the IUD string(s). You should be able to feel the end of the string(s) below the opening of your cervix. If your IUD string is in place, you may resume sexual activity. ? If you had a hormonal IUD inserted more than 7 days after your most recent period started, you will need to use a backup method of birth control for 7 days after IUD insertion. Ask your health care provider whether this applies to you.  Continue to check that the IUD is still in place by feeling for the string(s) after every menstrual period, or once a month.  Take over-the-counter and prescription medicines only as told by your health care provider.  Do not drive or use heavy machinery while taking prescription pain medicine.  Keep all follow-up visits as told by your health care provider. This is important. Contact a health care provider if:  You have bleeding that is heavier or lasts longer than a normal menstrual cycle.  You have a fever.  You have cramps or abdominal pain that get worse or do not get better with medicine.  You develop abdominal pain that is new or is not in the same area of earlier cramping and pain.  You feel lightheaded or weak.  You have abnormal or bad-smelling discharge from your vagina.  You have pain during sexual  activity.  You have any of the following problems with your IUD string(s): ? The string bothers or hurts you or your sexual partner. ? You cannot feel the string. ? The string has gotten longer.  You can feel the IUD in your vagina.  You think you may be pregnant, or you miss your menstrual period.  You think you may have an STI (sexually transmitted infection). Get help right away if:  You have flu-like symptoms.  You have a fever and chills.  You can feel that your IUD has slipped out of place. Summary  After the procedure, it is common to have cramps and pain in the abdomen. It is also common to have light bleeding (spotting) or heavier bleeding that is like your menstrual period.  Continue to check that the IUD is still in place by feeling for the string(s) after every menstrual period, or once a month.  Keep all follow-up visits as told by your health care provider. This is important.  Contact your health care provider if you have problems with your IUD string(s), such as the string getting longer or bothering you or your sexual partner. This information is not intended to replace advice given to you by your health care provider. Make sure you discuss any questions you have with your health care provider. Document Revised: 09/10/2017 Document Reviewed: 08/19/2016 Elsevier Patient Education  2020 [employer] Inc[employer]  

## 2020-01-03 NOTE — Addendum Note (Signed)
Addended by: Hildred Priest on: 01/03/2020 06:33 PM   Modules accepted: Orders

## 2020-01-03 NOTE — Progress Notes (Addendum)
   GYNECOLOGY OFFICE PROCEDURE NOTE  Sonya Bauer is a 29 y.o. G2P1001 here for Mirena IUD insertion. No GYN concerns.  Last pap smear was on 11/11/2016 and was normal. She is requesting IUD as an emergency contraception. She had unprotected intercourse on Saturday, she took the emergency contraceptive pill but states she has gotten pregnant with that in the past.   IUD Insertion Procedure Note Patient identified, informed consent performed, consent signed.   Discussed risks of irregular bleeding, cramping, infection, malpositioning or misplacement of the IUD outside the uterus which may require further procedure such as laparoscopy. Also discussed >99% contraception efficacy, increased risk of ectopic pregnancy with failure of method.  Time out was performed.  Urine pregnancy test negative.  Speculum placed in the vagina.  Cervix visualized.  Cleaned with Betadine x 2.  Grasped anteriorly with a single tooth tenaculum.  Uterus sounded to 8 cm.  Mirena IUD placed per manufacturer's recommendations.  Strings trimmed to 3 cm. Tenaculum was removed, good hemostasis noted.  Patient tolerated procedure well.   Patient was given post-procedure instructions.  She was advised to have backup contraception for one week.  Patient was also asked to check IUD strings periodically and follow up in 4 weeks for IUD check and physical exam .  Pt request referral for counseling to discuss recent events ( anxiety & depression) that she contributes to to cause of her behavior. Orders placed.    Philip Aspen, CNM

## 2020-01-05 ENCOUNTER — Encounter: Payer: Self-pay | Admitting: Plastic Surgery

## 2020-01-05 ENCOUNTER — Other Ambulatory Visit: Payer: Self-pay

## 2020-01-05 ENCOUNTER — Ambulatory Visit: Payer: 59 | Admitting: Plastic Surgery

## 2020-01-05 DIAGNOSIS — D1721 Benign lipomatous neoplasm of skin and subcutaneous tissue of right arm: Secondary | ICD-10-CM | POA: Diagnosis not present

## 2020-01-05 DIAGNOSIS — D179 Benign lipomatous neoplasm, unspecified: Secondary | ICD-10-CM | POA: Insufficient documentation

## 2020-01-05 NOTE — Progress Notes (Signed)
Patient ID: Sonya Bauer, female    DOB: April 02, 1991, 29 y.o.   MRN: [id number]   Chief Complaint  Patient presents with  . Advice Only    melanocytic nevi 2 under right axillary  . Skin Problem    The patient is a 29 year old white female here for evaluation of her right upper extremity axillary area.  She has had 2 children and may be interested in having another one.  She noticed fullness in her right proximal arm and axillary area.  It is significantly fuller than the left side.  There is a firm mass in the area.  It is likely a lipoma.  Nothing seems to make it better and it is getting larger with time.  She is otherwise healthy.  She is not a smoker.  She is 5 feet 3 inches tall and weighs 163 pounds.  She also asked about her lower lid fat.  She does not like the tired look she has.   Review of Systems  Constitutional: Negative for activity change and appetite change.  HENT: Negative.   Eyes: Negative.   Respiratory: Negative for cough, chest tightness and shortness of breath.   Cardiovascular: Negative for leg swelling.  Endocrine: Negative.   Genitourinary: Negative.   Musculoskeletal: Negative.   Hematological: Negative.   Psychiatric/Behavioral: Negative.     Past Medical History:  Diagnosis Date  . Complication of anesthesia   . Depression   . Family history of adverse reaction to anesthesia    MOM-HAD SEIZURE DURING SURGERY   . GERD (gastroesophageal reflux disease)    EVERY OTHER DAY  . Gestational diabetes   . Gluten intolerance 07/31/2019  . Headache   . Heart murmur    ASYMPTOMATIC  . Heartburn   . Hiatal hernia   . History of kidney stones   . Hypertension   . Kidney stones   . Motion sickness    long trips  . Pregnancy induced hypertension   . 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 CYSTOSCOPY WITH STENT PLACEMENT Left 10/06/2016   Procedure: CYSTOSCOPY WITH STENT PLACEMENT;  Surgeon: Alexis Frock,  MD;  Location: [facility];  Service: Urology;  Laterality: Left;  . TONSILLECTOMY AND ADENOIDECTOMY N/A 05/20/2018   Procedure: TONSILLECTOMY;  Surgeon: Beverly Gust, MD;  Location: [place];  Service: ENT;  Laterality: N/A;  . URETEROSCOPY WITH HOLMIUM LASER LITHOTRIPSY Left 10/06/2016   Procedure: URETEROSCOPY WITH HOLMIUM LASER LITHOTRIPSY;  Surgeon: Alexis Frock, MD;  Location: [facility];  Service: Urology;  Laterality: Left;  . WISDOM TOOTH EXTRACTION     AGE 29      Current Outpatient Medications:  .  amLODipine (NORVASC) 10 MG tablet, Take 1 tablet (10 mg total) by mouth daily., Disp: 90 tablet, Rfl: 3 .  B Complex-C (SUPER B COMPLEX PO), Take by mouth., Disp: , Rfl:  .  Butalbital-APAP-Caffeine 50-325-40 MG capsule, Take 1-2 capsules by mouth every 6 (six) hours as needed for headache., Disp: 30 capsule, Rfl: 3 .  Cholecalciferol (VITAMIN D3 PO), Take by mouth., Disp: , Rfl:  .  hydrochlorothiazide (HYDRODIURIL) 25 MG tablet, Take 1 tablet (25 mg total) by mouth daily., Disp: 30 tablet, Rfl: 3 .  ibuprofen (ADVIL) 600 MG tablet, Take 1 tablet (600 mg total) by mouth every 6 (six) hours., Disp: 30 tablet, Rfl: 0 .  lisinopril (ZESTRIL) 40 MG tablet, Take 40 mg by mouth every morning., Disp: ,  Rfl:  .  loratadine (CLARITIN) 10 MG tablet, Take 1 tablet (10 mg total) by mouth daily., Disp: 30 tablet, Rfl: 4 .  omeprazole (PRILOSEC) 20 MG capsule, TK 1 C PO ONCE A DAY 30 MIN B MORNING MEAL, Disp: , Rfl:  .  phentermine (ADIPEX-P) 37.5 MG tablet, , Disp: , Rfl:  .  valACYclovir (VALTREX) 500 MG tablet, TAKE 1 TABLET(500 MG) BY MOUTH TWICE DAILY, Disp: 30 tablet, Rfl: 1 .  docusate sodium (COLACE) 100 MG capsule, Take 1 capsule (100 mg total) by mouth 2 (two) times daily as needed., Disp: 30 capsule, Rfl: 2 .  Prenatal Vit-Fe Fumarate-FA (PRENATAL VITAMIN PO), Take 1 tablet by mouth daily., Disp: , Rfl:    Objective:   Vitals:   01/05/20 0926  BP: (!) 125/92  Pulse: 83    Temp: 98 F (36.7 C)  SpO2: 98%    Physical Exam Vitals and nursing note reviewed.  Constitutional:      Appearance: Normal appearance.  HENT:     Head: Normocephalic and atraumatic.  Cardiovascular:     Rate and Rhythm: Normal rate.     Pulses: Normal pulses.  Pulmonary:     Effort: Pulmonary effort is normal.  Chest:    Abdominal:     General: Abdomen is flat. There is no distension.     Tenderness: There is no abdominal tenderness.  Skin:    General: Skin is warm.     Capillary Refill: Capillary refill takes less than 2 seconds.  Neurological:     General: No focal deficit present.     Mental Status: She is alert and oriented to person, place, and time.  Psychiatric:        Mood and Affect: Mood normal.        Behavior: Behavior normal.     Assessment & Plan:  Lipoma of right upper extremity  Recommend excision of right axillary mass.  Most likely a lipoma.  We did talk about the fact that if she has another child it is possible that this could recur.  It does not mean that she should not have it done only that she should prepare herself of the possibility of recurrence.  She was given a quote for a lower lid blepharoplasty and we also talked about filler of the midface. Pictures were obtained of the patient and placed in the chart with the patient's or guardian's permission.   Wallace Going, DO   The 21st Century Cures Act was signed into law in 2016 which includes the topic of electronic health records.  This provides immediate access to information in MyChart.  This includes consultation notes, operative notes, office notes, lab results and pathology reports.  If you have any questions about what you read please let us know at your next visit or call us at the office.  We are right here with you.

## 2020-01-08 NOTE — Addendum Note (Signed)
Addended by: Hildred Priest on: 01/08/2020 08:32 AM   Modules accepted: Orders

## 2020-01-09 ENCOUNTER — Telehealth: Payer: Self-pay | Admitting: Certified Nurse Midwife

## 2020-01-09 NOTE — Telephone Encounter (Signed)
Pt called

## 2020-01-10 ENCOUNTER — Encounter: Payer: 59 | Admitting: Certified Nurse Midwife

## 2020-01-16 ENCOUNTER — Ambulatory Visit: Payer: 59 | Admitting: Certified Nurse Midwife

## 2020-01-16 ENCOUNTER — Telehealth: Payer: Self-pay | Admitting: Plastic Surgery

## 2020-01-16 ENCOUNTER — Other Ambulatory Visit: Payer: Self-pay

## 2020-01-16 ENCOUNTER — Other Ambulatory Visit (HOSPITAL_COMMUNITY)
Admission: RE | Admit: 2020-01-16 | Discharge: 2020-01-16 | Disposition: A | Discharge status: Home / Self Care | Payer: 59 | Source: Ambulatory Visit | Attending: Certified Nurse Midwife | Admitting: Certified Nurse Midwife

## 2020-01-16 ENCOUNTER — Encounter: Payer: Self-pay | Admitting: Certified Nurse Midwife

## 2020-01-16 VITALS — BP 120/93 | HR 89 | Ht 63.0 in | Wt 163.6 lb

## 2020-01-16 DIAGNOSIS — Z113 Encounter for screening for infections with a predominantly sexual mode of transmission: Secondary | ICD-10-CM | POA: Diagnosis not present

## 2020-01-16 DIAGNOSIS — Z01419 Encounter for gynecological examination (general) (routine) without abnormal findings: Secondary | ICD-10-CM

## 2020-01-16 DIAGNOSIS — Z30432 Encounter for removal of intrauterine contraceptive device: Secondary | ICD-10-CM

## 2020-01-16 DIAGNOSIS — Z124 Encounter for screening for malignant neoplasm of cervix: Secondary | ICD-10-CM | POA: Diagnosis not present

## 2020-01-16 NOTE — Progress Notes (Signed)
GYNECOLOGY ANNUAL PREVENTATIVE CARE ENCOUNTER NOTE  History:     Sonya Bauer is a 29 y.o. G68P1001 female here for a routine annual gynecologic exam.  Current complaints: has lump under left arm. Pt state this happened with hormonal changes in her last pregnancy. She saw surgeon about removal and was told that there are to many nerves to remove. She is concerned that IUD is causing this and requesting that it be removed.    Denies abnormal vaginal bleeding, discharge, pelvic pain, problems with intercourse or other gynecologic concerns.     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 Contraception: IUD, removed today  Last Pap: . 11/11/2016 Results were: normal  Last mammogram: n/a .  Obstetric History OB History  Gravida Para Term Preterm AB Living  2 1 1     1   SAB TAB Ectopic Multiple Live Births        0 1    # Outcome Date GA Lbr Len/2nd Weight Sex Delivery Anes PTL Lv  2 Gravida           1 Term 07/02/15 [redacted]w[redacted]d 05:50 / 00:14 6 lb 6.7 oz (2.91 kg) F Vag-Spont EPI  LIV    Past Medical History:  Diagnosis Date  . Complication of anesthesia   . Depression   . Family history of adverse reaction to anesthesia    MOM-HAD SEIZURE DURING SURGERY   . GERD (gastroesophageal reflux disease)    EVERY OTHER DAY  . Gestational diabetes   . Gluten intolerance 07/31/2019  . Headache   . Heart murmur    ASYMPTOMATIC  . Heartburn   . Hiatal hernia   . History of kidney stones   . Hypertension   . Kidney stones   . Motion sickness    long trips  . Pregnancy induced hypertension   . 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 CYSTOSCOPY WITH STENT PLACEMENT Left 10/06/2016   Procedure: CYSTOSCOPY WITH STENT PLACEMENT;  Surgeon: Alexis Frock, MD;  Location: [facility];  Service:  Urology;  Laterality: Left;  . TONSILLECTOMY AND ADENOIDECTOMY N/A 05/20/2018   Procedure: TONSILLECTOMY;  Surgeon: Beverly Gust, MD;  Location: [facility];  Service: ENT;  Laterality: N/A;  . URETEROSCOPY WITH HOLMIUM LASER LITHOTRIPSY Left 10/06/2016   Procedure: URETEROSCOPY WITH HOLMIUM LASER LITHOTRIPSY;  Surgeon: Alexis Frock, MD;  Location: [facility];  Service: Urology;  Laterality: Left;  . WISDOM TOOTH EXTRACTION     AGE 40    Current Outpatient Medications on File Prior to Visit  Medication Sig Dispense Refill  . amLODipine (NORVASC) 10 MG tablet Take 1 tablet (10 mg total) by mouth daily. 90 tablet 3  . B Complex-C (SUPER B COMPLEX PO) Take by mouth.    . Butalbital-APAP-Caffeine 50-325-40 MG capsule Take 1-2 capsules by mouth every 6 (six) hours as needed for headache. 30 capsule 3  . Cholecalciferol (VITAMIN D3 PO) Take by mouth.    . hydrochlorothiazide (HYDRODIURIL) 25 MG tablet Take 1 tablet (25 mg total) by mouth daily. 30 tablet 3  . ibuprofen (ADVIL) 600 MG tablet Take 1 tablet (600 mg total) by mouth every 6 (six) hours. 30 tablet 0  . levonorgestrel (MIRENA) 20 MCG/24HR IUD 1 each by Intrauterine route once.    Marland Kitchen lisinopril (ZESTRIL) 40 MG tablet Take  40 mg by mouth every morning.    . loratadine (CLARITIN) 10 MG tablet Take 1 tablet (10 mg total) by mouth daily. 30 tablet 4  . omeprazole (PRILOSEC) 20 MG capsule TK 1 C PO ONCE A DAY 30 MIN B MORNING MEAL    . valACYclovir (VALTREX) 500 MG tablet TAKE 1 TABLET(500 MG) BY MOUTH TWICE DAILY 30 tablet 1  . docusate sodium (COLACE) 100 MG capsule Take 1 capsule (100 mg total) by mouth 2 (two) times daily as needed. 30 capsule 2  . phentermine (ADIPEX-P) 37.5 MG tablet     . Prenatal Vit-Fe Fumarate-FA (PRENATAL VITAMIN PO) Take 1 tablet by mouth daily.     No current facility-administered medications on file prior to visit.    Allergies  Allergen Reactions  . Azithromycin Nausea And Vomiting    Social  History:  reports that she has never smoked. She has never used smokeless tobacco. She reports previous alcohol use. She reports that she does not use drugs.  Family History  Problem Relation Age of Onset  . Hypertension Father   . Asthma Mother   . Diabetes Maternal Aunt   . Diabetes Maternal Grandmother   . Kidney disease Neg Hx   . Bladder Cancer Neg Hx     The following portions of the patient's history were reviewed and updated as appropriate: allergies, current medications, past family history, past medical history, past social history, past surgical history and problem list.  Review of Systems Pertinent items noted in HPI and remainder of comprehensive ROS otherwise negative.  Physical Exam:  BP (!) 120/93   Pulse 89   Ht 5\' 3"  (1.6 m)   Wt 163 lb 9 oz (74.2 kg)   LMP 12/17/2019 (Exact Date)   BMI 28.97 kg/m  CONSTITUTIONAL: Well-developed, well-nourished female in no acute distress.  HENT:  Normocephalic, atraumatic, External right and left ear normal. Oropharynx is clear and moist EYES: Conjunctivae and EOM are normal. Pupils are equal, round, and reactive to light. No scleral icterus.  NECK: Normal range of motion, supple, no masses.  Normal thyroid.  SKIN: Skin is warm and dry. No rash noted. Not diaphoretic. No erythema. No pallor. MUSCULOSKELETAL: Normal range of motion. No tenderness.  No cyanosis, clubbing, or edema.  2+ distal pulses. NEUROLOGIC: Alert and oriented to person, place, and time. Normal reflexes, muscle tone coordination.  PSYCHIATRIC: Normal mood and affect. Normal behavior. Normal judgment and thought content. CARDIOVASCULAR: Normal heart rate noted, regular rhythm RESPIRATORY: Clear to auscultation bilaterally. Effort and breath sounds normal, no problems with respiration noted. BREASTS: Symmetric in size. No masses, tenderness, skin changes, nipple drainage, or lymphadenopathy bilaterally. Performed in the presence of a chaperone. Left under arm  area has excess skin no mass palpated , normal lymph nodes, no redness or tenderness on palpation.  ABDOMEN: Soft, no distention noted.  No tenderness, rebound or guarding.  PELVIC: Normal appearing external genitalia and urethral meatus; normal appearing vaginal mucosa and cervix.  No abnormal discharge noted.  Pap smear obtained.  Normal uterine size, no other palpable masses, no uterine or adnexal tenderness.  Performed in the presence of a chaperone.    GYNECOLOGY OFFICE PROCEDURE NOTE  KAYTLINN OPSAHL is a 29 y.o. G2P1001 here for mirena IUD removal. No GYN concerns. IUD Removal  Patient identified, informed consent performed, consent signed.  Patient was in the dorsal lithotomy position, normal external genitalia was noted.  A speculum was placed in the patient's vagina, normal discharge was  noted, no lesions. The cervix was visualized, no lesions, no abnormal discharge.  The strings of the IUD were grasped and pulled using ring forceps. The IUD was removed in its entirety.  Patient tolerated the procedure well.      Assessment and Plan:    1. Women's annual routine gynecological examination  Will follow up results of pap smear and manage accordingly. Mammogram n/a  Pt declines alternative birth control methods at this time. She will let me know if she changes her mind.  Labs: STD testing She request referral at last visit for counseling. She has received call about counseling has to call back to schedule appointment. Routine preventative health maintenance measures emphasized. Please refer to After Visit Summary for other counseling recommendations.      Philip Aspen, CNM

## 2020-01-16 NOTE — Patient Instructions (Signed)
Preventive Care 21-29 Years Old, Female Preventive care refers to visits with your health care provider and lifestyle choices that can promote health and wellness. This includes:  A yearly physical exam. This may also be called an annual well check.  Regular dental visits and eye exams.  Immunizations.  Screening for certain conditions.  Healthy lifestyle choices, such as eating a healthy diet, getting regular exercise, not using drugs or products that contain nicotine and tobacco, and limiting alcohol use. What can I expect for my preventive care visit? Physical exam Your health care provider will check your:  Height and weight. This may be used to calculate body mass index (BMI), which tells if you are at a healthy weight.  Heart rate and blood pressure.  Skin for abnormal spots. Counseling Your health care provider may ask you questions about your:  Alcohol, tobacco, and drug use.  Emotional well-being.  Home and relationship well-being.  Sexual activity.  Eating habits.  Work and work environment.  Method of birth control.  Menstrual cycle.  Pregnancy history. What immunizations do I need?  Influenza (flu) vaccine  This is recommended every year. Tetanus, diphtheria, and pertussis (Tdap) vaccine  You may need a Td booster every 10 years. Varicella (chickenpox) vaccine  You may need this if you have not been vaccinated. Human papillomavirus (HPV) vaccine  If recommended by your health care provider, you may need three doses over 6 months. Measles, mumps, and rubella (MMR) vaccine  You may need at least one dose of MMR. You may also need a second dose. Meningococcal conjugate (MenACWY) vaccine  One dose is recommended if you are age 19-21 years and a first-year college student living in a residence hall, or if you have one of several medical conditions. You may also need additional booster doses. Pneumococcal conjugate (PCV13) vaccine  You may need  this if you have certain conditions and were not previously vaccinated. Pneumococcal polysaccharide (PPSV23) vaccine  You may need one or two doses if you smoke cigarettes or if you have certain conditions. Hepatitis A vaccine  You may need this if you have certain conditions or if you travel or work in places where you may be exposed to hepatitis A. Hepatitis B vaccine  You may need this if you have certain conditions or if you travel or work in places where you may be exposed to hepatitis B. Haemophilus influenzae type b (Hib) vaccine  You may need this if you have certain conditions. You may receive vaccines as individual doses or as more than one vaccine together in one shot (combination vaccines). Talk with your health care provider about the risks and benefits of combination vaccines. What tests do I need?  Blood tests  Lipid and cholesterol levels. These may be checked every 5 years starting at age 20.  Hepatitis C test.  Hepatitis B test. Screening  Diabetes screening. This is done by checking your blood sugar (glucose) after you have not eaten for a while (fasting).  Sexually transmitted disease (STD) testing.  BRCA-related cancer screening. This may be done if you have a family history of breast, ovarian, tubal, or peritoneal cancers.  Pelvic exam and Pap test. This may be done every 3 years starting at age 21. Starting at age 30, this may be done every 5 years if you have a Pap test in combination with an HPV test. Talk with your health care provider about your test results, treatment options, and if necessary, the need for more tests.   Follow these instructions at home: Eating and drinking   Eat a diet that includes fresh fruits and vegetables, whole grains, lean protein, and low-fat dairy.  Take vitamin and mineral supplements as recommended by your health care provider.  Do not drink alcohol if: ? Your health care provider tells you not to drink. ? You are  pregnant, may be pregnant, or are planning to become pregnant.  If you drink alcohol: ? Limit how much you have to 0-1 drink a day. ? Be aware of how much alcohol is in your drink. In the [place] one drink equals one 12 oz bottle of beer (355 mL), one 5 oz glass of wine (148 mL), or one 1 oz glass of hard liquor (44 mL). Lifestyle  Take daily care of your teeth and gums.  Stay active. Exercise for at least 30 minutes on 5 or more days each week.  Do not use any products that contain nicotine or tobacco, such as cigarettes, e-cigarettes, and chewing tobacco. If you need help quitting, ask your health care provider.  If you are sexually active, practice safe sex. Use a condom or other form of birth control (contraception) in order to prevent pregnancy and STIs (sexually transmitted infections). If you plan to become pregnant, see your health care provider for a preconception visit. What's next?  Visit your health care provider once a year for a well check visit.  Ask your health care provider how often you should have your eyes and teeth checked.  Stay up to date on all vaccines. This information is not intended to replace advice given to you by your health care provider. Make sure you discuss any questions you have with your health care provider. Document Revised: 06/09/2018 Document Reviewed: 06/09/2018 Elsevier Patient Education  2020 [employer].

## 2020-01-16 NOTE — Telephone Encounter (Signed)
Called patient to advise of approval from insurance for the surgery discussed with Dr. Marla Roe. The patient advised that she is going to wait until next year due to the uncertainty of having another baby, and not wanting to have to have the surgery more than once if the area grows back. I advised the patient that I would make a note of our conversation and would wait to hear back from her once she decides that she is ready to proceed. Patient expressed understanding and agreed to the plan.

## 2020-01-17 LAB — CYTOLOGY - PAP
Chlamydia: NEGATIVE
Comment: NEGATIVE
Comment: NEGATIVE
Comment: NORMAL
Diagnosis: NEGATIVE
Neisseria Gonorrhea: NEGATIVE
Trichomonas: NEGATIVE

## 2020-01-17 LAB — RPR: RPR Ser Ql: NONREACTIVE

## 2020-01-17 LAB — HSV 1 AND 2 IGM ABS, INDIRECT
HSV 1 IgM: <1:10 {titer}
HSV 2 IgM: <1:10 {titer}

## 2020-01-17 LAB — HIV ANTIBODY (ROUTINE TESTING W REFLEX): HIV Screen 4th Generation wRfx: NONREACTIVE

## 2020-01-17 LAB — HEPATITIS B SURFACE ANTIGEN: Hepatitis B Surface Ag: NEGATIVE

## 2020-01-22 ENCOUNTER — Ambulatory Visit (INDEPENDENT_AMBULATORY_CARE_PROVIDER_SITE_OTHER): Payer: Self-pay | Admitting: Family Medicine

## 2020-01-29 ENCOUNTER — Encounter: Payer: 59 | Admitting: Certified Nurse Midwife

## 2020-02-05 ENCOUNTER — Ambulatory Visit (INDEPENDENT_AMBULATORY_CARE_PROVIDER_SITE_OTHER): Payer: Self-pay | Admitting: Family Medicine

## 2020-02-05 ENCOUNTER — Encounter: Payer: Self-pay | Admitting: Licensed Clinical Social Worker

## 2020-02-05 ENCOUNTER — Ambulatory Visit: Payer: 59 | Admitting: Licensed Clinical Social Worker

## 2020-02-05 DIAGNOSIS — F411 Generalized anxiety disorder: Secondary | ICD-10-CM | POA: Insufficient documentation
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
## 2020-02-06 ENCOUNTER — Encounter: Payer: 59 | Admitting: Certified Nurse Midwife

## 2020-02-19 ENCOUNTER — Ambulatory Visit: Payer: 59 | Admitting: Licensed Clinical Social Worker

## 2020-02-19 DIAGNOSIS — F411 Generalized anxiety disorder: Secondary | ICD-10-CM

## 2020-02-19 NOTE — Progress Notes (Signed)
Counselor/Therapist Progress Note  Patient ID: JAHLAYA POLA, MRN: [id number],    Date: 02/19/2020  Time Spent: 45 minutes  Treatment Type: Individual Therapy  Reported Symptoms: Obsessive thinking, Anhedonia, Sleep disturbance and anxiety, anxious thoughts, low energy/fatigue   Mental Status Exam:   Appearance:   Casual     Behavior:  Appropriate and Sharing  Motor:  Normal  Speech/Language:   Normal Rate  Affect:  Appropriate  Mood:  normal  Thought process:  normal  Thought content:    WNL  Sensory/Perceptual disturbances:    WNL  Orientation:  oriented to person, place, time/date and situation  Attention:  Good  Concentration:  Good  Memory:  WNL  Fund of knowledge:   Good  Insight:    Good  Judgment:   Good  Impulse Control:  Good   Risk Assessment: Danger to Self:  No Self-injurious Behavior: No Danger to Others: No Duty to Warn:no Physical Aggression / Violence:No  Access to Firearms a concern: No  Gang Involvement:No   Subjective: Patient was engaged and cooperative throughout the session using time effectively to discuss thoughts and feelings. Patient voices continued motivation for treatment and understanding of anxiety and mood issues. Patient is likely to benefit from future treatment because she remains motivated to decrease symptoms and improve functioning.   Interventions: Cognitive Behavioral Therapy  Established psychological safety.  Checked in with patient and reviewed previous session, including assessment and goal of treatment. Reviewed CBTs. Explored patient's goal of treatment and worked collaboratively to develop CBT treatment plan. Discussed patient following up with PCP due to significantly low energy/fatigue; discussed increasing water intake.  Provided support through active listening, validation of feelings, and highlighted patient's strengths.  Diagnosis:   ICD-10-CM   1. Generalized anxiety disorder  F41.1     Plan: Patient's goal  is: to be able to manage thoughts better; calm mind; less irritated  Treatment Target: Increase realistic balanced thinking  - Explore patient's thoughts, beliefs, automatic thoughts, assumptions  - Identify hot thoughts (upsetting ideas, self-talk and mental images) - Process distress and allow for emotional release  - Cognitive reframing  - Questioning and challenging thoughts - Restructuring, Socratic questioning  Treatment Target: Increase coping skills - Values clarification   - Self-care - nutrition, sleep, exercise  - STOP technique - Introduce Mindfulness  - Teach mindfulness-  focus on awareness of thoughts and feelings without attachment or judgment - Practice Mindfulness/acceptance meditation for anxiety/worry/ruminating thoughts  Future Appointments  Date Time Provider [place]  02/26/2020 10:00 AM Milton Ferguson, LCSW [facility] None  09/02/2020  9:00 AM Philip Aspen, CNM [place] None    Interpreter used: NA   Milton Ferguson, LCSW

## 2020-02-26 ENCOUNTER — Ambulatory Visit: Payer: 59 | Admitting: Licensed Clinical Social Worker

## 2020-02-26 DIAGNOSIS — F411 Generalized anxiety disorder: Secondary | ICD-10-CM

## 2020-02-26 NOTE — Progress Notes (Signed)
Counselor/Therapist Progress Note  Patient ID: Sonya Bauer, MRN: [id number],    Date: 02/26/2020  Time Spent: 50 minutes   Treatment Type: Individual Therapy  Reported Symptoms: Fatigue and overall stable mood; anxiety, anxious thought patterns, avoidance due to overwhelm  Mental Status Exam:  Appearance:   Casual     Behavior:  Appropriate and Sharing  Motor:  Normal  Speech/Language:   Normal Rate  Affect:  Appropriate  Mood:  normal  Thought process:  normal  Thought content:    WNL  Sensory/Perceptual disturbances:    WNL  Orientation:  oriented to person, place, time/date, situation and day of week  Attention:  Good  Concentration:  Good  Memory:  WNL  Fund of knowledge:   Good  Insight:    Good  Judgment:   Good  Impulse Control:  Good   Risk Assessment: Danger to Self:  No Self-injurious Behavior: No Danger to Others: No Duty to Warn:no Physical Aggression / Violence:No  Access to Firearms a concern: No  Gang Involvement:No   Subjective: Patient was engaged and cooperative throughout the session using time effectively to discuss thoughts and feelings. Patient voices agreement with treatment plan and understanding of anxiety. Patient is likely to benefit from future treatment because she remains motivated to decrease symptoms and reports benefit of regular sessions in addressing these symptoms.   Interventions: Cognitive Behavioral Therapy  Established psychological safety. Checked in with patient and reviewed treatment plan.  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 Discussed realistic goals around house chores, use of a timer, acting opposite to thoughts, and use of deep breaths. Discussed patient seeking med eval  for anxiety. Provided support through active listening, validation of feelings, and highlighted patient's strengths.   Diagnosis:   ICD-10-CM   1. Generalized anxiety disorder  F41.1     Plan: Patient's goal is:to be able to manage thoughts better; calm mind; less irritated Treatment Target: Increase realistic balanced thinking   Explore patient's thoughts, beliefs, automatic thoughts, assumptions   Identify hot thoughts(upsetting ideas, self-talk and mental images)  Process distress and allow for emotional release   Cognitive reframing   Questioning and challenging thoughts  Restructuring, Socratic questioning  Treatment Target: Increase coping skills  Values clarification    Self-care - nutrition, sleep, exercise   STOP technique  Introduce Mindfulness   Teach mindfulness- focus on awareness of thoughts and feelings without attachment or judgment  Practice Mindfulness/acceptance meditation for anxiety/worry/ruminating thoughts  Future Appointments  Date Time Provider [place]  03/04/2020  1:00 PM Milton Ferguson, LCSW [facility] None  09/02/2020  9:00 AM Philip Aspen, CNM [place] None    Interpreter used: NA   Milton Ferguson, LCSW

## 2020-03-04 ENCOUNTER — Ambulatory Visit: Payer: 59 | Admitting: Licensed Clinical Social Worker

## 2020-03-04 DIAGNOSIS — F411 Generalized anxiety disorder: Secondary | ICD-10-CM

## 2020-03-04 NOTE — Progress Notes (Signed)
Counselor/Therapist Progress Note  Patient ID: Sonya Bauer, MRN: [id number],    Date: 03/04/2020  Time Spent: 45 minutes   Treatment Type: Individual Therapy  Reported Symptoms: Obsessive thinking, Sleep disturbance and low energy, anxiety, anxious thoughts  Mental Status Exam:  Appearance:   Casual     Behavior:  Appropriate and Sharing  Motor:  Normal  Speech/Language:   Normal Rate  Affect:  Appropriate and Congruent  Mood:  normal  Thought process:  normal  Thought content:    WNL  Sensory/Perceptual disturbances:    WNL  Orientation:  oriented to person, place, time/date and situation  Attention:  Good  Concentration:  Good  Memory:  WNL  Fund of knowledge:   Good  Insight:    Good  Judgment:   Good  Impulse Control:  Good   Risk Assessment: Danger to Self:  No Self-injurious Behavior: No Danger to Others: No Duty to Warn:no Physical Aggression / Violence:No  Access to Firearms a concern: No  Gang Involvement:No   Subjective: Patient was engaged and cooperative throughout the session using time effectively to discuss thoughts and feelings. Patient voices continued motivation for treatment and understanding of anxiety issues. Patient is likely to benefit from future treatment because she remains motivated to decrease anxiety, reports utilizing strategies discussed, and reports benefit of regular sessions in addressing anxiety symptoms.    Interventions: Cognitive Behavioral Therapy  Established psychological safety. Checked in with patient and engaged her in processing current psychosocial stressors, continued anxiety symptoms due to daily living. Explored patient's perceptions of struggles with overwhelm/mind constantly thinking, talked about barriers and saying no, use of STOP, and being in the moment. Discussed teaching mindfulness at next session. Reviewed previous session and use of timer. Provided support through active listening, validation of feelings, and  highlighted patient's strengths.   Diagnosis:   ICD-10-CM   1. Generalized anxiety disorder  F41.1     Plan: Focus on Mindfulness. Follow up on counting letters in sentences since age 63yo.   Patient's goal is:to be able to manage thoughts better; calm mind; less irritated Treatment Target: Increase realistic balanced thinking   Explore patient's thoughts, beliefs, automatic thoughts, assumptions   Identify hot thoughts(upsetting ideas, self-talk and mental images)  Process distress and allow for emotional release   Cognitive reframing   Questioning and challenging thoughts  Restructuring, Socratic questioning  Treatment Target: Increase coping skills  Values clarification   Self-care -nutrition, sleep, exercise   STOP technique  Introduce Mindfulness   Teach mindfulness- focus on awareness of thoughts and feelings without attachment or judgment  Practice Mindfulness/acceptance meditation for anxiety/worry/ruminating thoughts   Future Appointments  Date Time Provider [place]  09/02/2020  9:00 AM Philip Aspen, CNM [place] None    Interpreter used: NA   Milton Ferguson, LCSW

## 2020-03-12 ENCOUNTER — Ambulatory Visit: Payer: 59 | Admitting: Licensed Clinical Social Worker

## 2020-03-25 ENCOUNTER — Ambulatory Visit: Payer: 59 | Admitting: Licensed Clinical Social Worker

## 2020-03-26 ENCOUNTER — Ambulatory Visit: Payer: 59 | Admitting: Licensed Clinical Social Worker

## 2020-03-26 DIAGNOSIS — F411 Generalized anxiety disorder: Secondary | ICD-10-CM

## 2020-03-26 NOTE — Progress Notes (Signed)
Counselor/Therapist Progress Note  Patient ID: Sonya Bauer, MRN: [id number],    Date: 03/26/2020  Time Spent: 45 minutes   Treatment Type: Individual Therapy  Reported Symptoms: Obsessive thinking, Sleep disturbance, Fatigue and anxiety, anxious thoughts   Mental Status Exam:  Appearance:   Casual     Behavior:  Appropriate and Sharing  Motor:  Normal  Speech/Language:   Normal Rate  Affect:  Appropriate  Mood:  normal  Thought process:  normal  Thought content:    WNL  Sensory/Perceptual disturbances:    WNL  Orientation:  oriented to person, place, time/date and situation  Attention:  Good  Concentration:  Good  Memory:  WNL  Fund of knowledge:   Good  Insight:    Good  Judgment:   Good  Impulse Control:  Good   Risk Assessment: Danger to Self:  No Self-injurious Behavior: No Danger to Others: No Duty to Warn:no Physical Aggression / Violence:No  Access to Firearms a concern: No  Gang Involvement:No   Subjective: Patient was engaged and cooperative throughout the session using time effectively to discuss thoughts, feelings and coping skills. Patient voices continued motivation for treatment and understanding of anxiety issues. Patient is likely to benefit from future treatment because she remains motivated to decrease anxiety and imporve funcitoning. Patient voices she began taking Zoloft 1 week ago- prescribed by PCP.   Interventions: Cognitive Behavioral Therapy Established psychological safety. Checked in with patient. Reviewed previous session. Continued to explore patient's experience of counting letters in sentences, discussed intrusive thoughts and using STOP technique when this occurs. Provided psychoeducation on sleep hygiene and discussed patient staying off her phone for 1 hour before bedtime, reading, and getting out of bed if she is unable to fall asleep. Provided Psychoeducation on mindfulness, engaged patient in one minute counting breaths mindfulness  exercise, processed exercise, and contracted with patient to complete daily.   Diagnosis:   ICD-10-CM   1. Generalized anxiety disorder  F41.1     Plan: Check in on Mindfulness one minute counting breaths exercise.   Patient's goal is:to be able to manage thoughts better; calm mind; less irritated Treatment Target: Increase realistic balanced thinking   Explore patient's thoughts, beliefs, automatic thoughts, assumptions   Identify hot thoughts(upsetting ideas, self-talk and mental images)  Process distress and allow for emotional release   Cognitive reframing   Questioning and challenging thoughts  Restructuring, Socratic questioning  Treatment Target: Increase coping skills  Values clarification   Self-care -nutrition, sleep, exercise   STOP technique  Introduce Mindfulness   Teach mindfulness- focus on awareness of thoughts and feelings without attachment or judgment  Practice Mindfulness/acceptance meditation for anxiety/worry/ruminating thoughts  Future Appointments  Date Time Provider [place]  04/10/2020  1:30 PM Milton Ferguson, LCSW [facility] None  09/02/2020  9:00 AM Philip Aspen, CNM [facility] None    Interpreter used: NA   Milton Ferguson, LCSW

## 2020-04-10 ENCOUNTER — Ambulatory Visit: Payer: 59 | Admitting: Licensed Clinical Social Worker

## 2020-04-10 DIAGNOSIS — F411 Generalized anxiety disorder: Secondary | ICD-10-CM

## 2020-04-10 NOTE — Progress Notes (Signed)
Counselor/Therapist Progress Note  Patient ID: SECILY WALTHOUR, MRN: [id number],    Date: 04/10/2020  Time Spent: 45 minutes   Treatment Type: Individual Therapy  Reported Symptoms: Sleep disturbance, Appetite disturbance, Fatigue and mild decrease in anxiety, anxious thoughts/worries, obsessive thoughts and urges  Mental Status Exam:  Appearance:   Casual     Behavior:  Appropriate and Sharing  Motor:  Normal  Speech/Language:   Normal Rate  Affect:  Appropriate and Congruent  Mood:  normal  Thought process:  normal  Thought content:    WNL  Sensory/Perceptual disturbances:    WNL  Orientation:  oriented to person, place, time/date, situation and day of week  Attention:  Good  Concentration:  Good  Memory:  WNL  Fund of knowledge:   Good  Insight:    Good  Judgment:   Good  Impulse Control:  Good   Risk Assessment: Danger to Self:  No Self-injurious Behavior: No Danger to Others: No Duty to Warn:no Physical Aggression / Violence:No  Access to Firearms a concern: No  Gang Involvement:No   Subjective: Patient was engaged and cooperative throughout the session using time effectively to discuss thoughts, feelings and coping stratigies. Patient voices continued motivation for treatment and understanding of anxiety issues. Patient is likely to benefit from future treatment because she remains motivated to decrease symptoms and improve functioning and reports benefit of regular sessions in addressing symptoms.   Interventions: Cognitive Behavioral Therapy  Established psychological safety. Provided supportive space encouraging emotional release and processing of current psychosocial stressors continued mild anxiety symptoms with some improvements; health issues. Reviewed previous sessions and coping skills, including noticing thoughts, reframing thoughts, living within your values, and mindfulness breathing exercise. Encouraged patient to practice self-care. Provided support  through active listening, validation of feelings, and highlighted patient's strengths.   Diagnosis:   ICD-10-CM   1. Generalized anxiety disorder  F41.1    Plan: teach patient 5 senses mindfulness exercise   Patient's goal is:to be able to manage thoughts better; calm mind; less irritated Treatment Target: Increase realistic balanced thinking   Explore patient's thoughts, beliefs, automatic thoughts, assumptions   Identify hot thoughts(upsetting ideas, self-talk and mental images)  Process distress and allow for emotional release   Cognitive reframing   Questioning and challenging thoughts  Restructuring, Socratic questioning  Treatment Target: Increase coping skills  Values clarification   Self-care -nutrition, sleep, exercise   STOP technique  Introduce Mindfulness   Teach mindfulness- focus on awareness of thoughts and feelings without attachment or judgment  Practice Mindfulness/acceptance meditation for anxiety/worry/ruminating thoughts  Future Appointments  Date Time Provider [place]  04/23/2020  1:30 PM Milton Ferguson, LCSW [facility] None  09/02/2020  9:00 AM Philip Aspen, CNM [place] None   Interpreter used: NA  Milton Ferguson, LCSW

## 2020-04-23 ENCOUNTER — Ambulatory Visit: Payer: 59 | Admitting: Licensed Clinical Social Worker

## 2020-05-22 ENCOUNTER — Encounter: Payer: Self-pay | Admitting: Certified Nurse Midwife

## 2020-05-22 ENCOUNTER — Other Ambulatory Visit (HOSPITAL_COMMUNITY)
Admission: RE | Admit: 2020-05-22 | Discharge: 2020-05-22 | Disposition: A | Discharge status: Home / Self Care | Payer: 59 | Source: Ambulatory Visit | Attending: Certified Nurse Midwife | Admitting: Certified Nurse Midwife

## 2020-05-22 ENCOUNTER — Ambulatory Visit (INDEPENDENT_AMBULATORY_CARE_PROVIDER_SITE_OTHER): Payer: 59 | Admitting: Certified Nurse Midwife

## 2020-05-22 VITALS — BP 128/92 | Ht 65.0 in | Wt 172.0 lb

## 2020-05-22 DIAGNOSIS — R3989 Other symptoms and signs involving the genitourinary system: Secondary | ICD-10-CM | POA: Diagnosis not present

## 2020-05-22 DIAGNOSIS — Z113 Encounter for screening for infections with a predominantly sexual mode of transmission: Secondary | ICD-10-CM | POA: Insufficient documentation

## 2020-05-22 DIAGNOSIS — R102 Pelvic and perineal pain unspecified side: Secondary | ICD-10-CM | POA: Insufficient documentation

## 2020-05-22 DIAGNOSIS — Z3189 Encounter for other procreative management: Secondary | ICD-10-CM | POA: Insufficient documentation

## 2020-05-22 NOTE — Progress Notes (Signed)
GYN ENCOUNTER NOTE  Subjective:       Sonya Bauer is a 29 y.o. G52P1001 female is here for gynecologic evaluation of the following issues:  1. Pt requesting STD follow up ( had one night stand several months ago) , she is considering getting pregnant with her husband and wants to make sure she has no infections. She also complains of pelvic pressure and frequent urination, bladder pressure.    Gynecologic History Patient's last menstrual period was 04/26/2020 (exact date). Contraception: none Last Pap: 01/16/2020. Results were: normal Last mammogram: n/a  Obstetric History OB History  Gravida Para Term Preterm AB Living  2 1 1     1   SAB TAB Ectopic Multiple Live Births        0 1    # Outcome Date GA Lbr Len/2nd Weight Sex Delivery Anes PTL Lv  2 Gravida           1 Term 07/02/15 [redacted]w[redacted]d 05:50 / 00:14 6 lb 6.7 oz (2.91 kg) F Vag-Spont EPI  LIV    Past Medical History:  Diagnosis Date  . Complication of anesthesia   . Depression   . Family history of adverse reaction to anesthesia    MOM-HAD SEIZURE DURING SURGERY   . GERD (gastroesophageal reflux disease)    EVERY OTHER DAY  . Gestational diabetes   . Gluten intolerance 07/31/2019  . Headache   . Heart murmur    ASYMPTOMATIC  . Heartburn   . Hiatal hernia   . History of kidney stones   . Hypertension   . Kidney stones   . Motion sickness    long trips  . Pregnancy induced hypertension   . 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 CYSTOSCOPY WITH STENT PLACEMENT Left 10/06/2016   Procedure: CYSTOSCOPY WITH STENT PLACEMENT;  Surgeon: Alexis Frock, MD;  Location: [facility];  Service: Urology;  Laterality: Left;  . TONSILLECTOMY AND ADENOIDECTOMY N/A 05/20/2018   Procedure: TONSILLECTOMY;  Surgeon: Beverly Gust, MD;  Location: [place];  Service: ENT;  Laterality: N/A;  . URETEROSCOPY WITH HOLMIUM LASER LITHOTRIPSY Left 10/06/2016   Procedure: URETEROSCOPY WITH  HOLMIUM LASER LITHOTRIPSY;  Surgeon: Alexis Frock, MD;  Location: [facility];  Service: Urology;  Laterality: Left;  . WISDOM TOOTH EXTRACTION     AGE 81    Current Outpatient Medications on File Prior to Visit  Medication Sig Dispense Refill  . amLODipine (NORVASC) 10 MG tablet Take 1 tablet (10 mg total) by mouth daily. 90 tablet 3  . B Complex-C (SUPER B COMPLEX PO) Take by mouth.    . Cholecalciferol (VITAMIN D3 PO) Take by mouth.    Marland Kitchen ibuprofen (ADVIL) 600 MG tablet Take 1 tablet (600 mg total) by mouth every 6 (six) hours. 30 tablet 0  . levocetirizine (XYZAL) 5 MG tablet Take 5 mg by mouth every evening.    . loratadine (CLARITIN) 10 MG tablet Take 1 tablet (10 mg total) by mouth daily. 30 tablet 4  . losartan-hydrochlorothiazide (HYZAAR) 50-12.5 MG tablet Take 1 tablet by mouth daily.    . valACYclovir (VALTREX) 500 MG tablet TAKE 1 TABLET(500 MG) BY MOUTH TWICE DAILY 30 tablet 1  . Butalbital-APAP-Caffeine 50-325-40 MG capsule Take 1-2 capsules by mouth every 6 (six) hours as needed for headache. (Patient not taking: Reported on 05/22/2020) 30 capsule 3  . docusate sodium (COLACE) 100 MG capsule Take 1 capsule (100 mg total) by mouth 2 (  two) times daily as needed. 30 capsule 2  . hydrochlorothiazide (HYDRODIURIL) 25 MG tablet Take 1 tablet (25 mg total) by mouth daily. (Patient not taking: Reported on 05/22/2020) 30 tablet 3  . levonorgestrel (MIRENA) 20 MCG/24HR IUD 1 each by Intrauterine route once. (Patient not taking: Reported on 05/22/2020)    . lisinopril (ZESTRIL) 40 MG tablet Take 40 mg by mouth every morning. (Patient not taking: Reported on 05/22/2020)    . omeprazole (PRILOSEC) 20 MG capsule TK 1 C PO ONCE A DAY 30 MIN B MORNING MEAL    . phentermine (ADIPEX-P) 37.5 MG tablet  (Patient not taking: Reported on 05/22/2020)    . Prenatal Vit-Fe Fumarate-FA (PRENATAL VITAMIN PO) Take 1 tablet by mouth daily.     No current facility-administered medications on file prior to visit.     Allergies  Allergen Reactions  . Azithromycin Nausea And Vomiting    Social History   Socioeconomic History  . Marital status: Married    Spouse name: Not on file  . Number of children: 2  . Years of education: Not on file  . Highest education level: Bachelor's degree (e.g., BA, AB, BS)  Occupational History  . Occupation: unemployed   Tobacco Use  . Smoking status: Never Smoker  . Smokeless tobacco: Never Used  Vaping Use  . Vaping Use: Never used  Substance and Sexual Activity  . Alcohol use: Yes    Comment: may have 2-3 drinks 2x/month  . Drug use: No  . Sexual activity: Yes  Other Topics Concern  . Not on file  Social History Narrative   Patient is married and has two children ages 76yo and 56mo. 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 She reports having a supportive marriage, supportive family relationships, and has a few close friends.    Social Determinants of Health   Financial Resource Strain:   . Difficulty of Paying Living Expenses:   Food Insecurity:   . Worried About Charity fundraiser in the Last Year:   . Arboriculturist in the Last Year:   Transportation Needs:   . Film/video editor (Medical):   Marland Kitchen Lack of Transportation (Non-Medical):   Physical Activity:   . Days of Exercise per Week:   . Minutes of Exercise per Session:   Stress:   . Feeling of Stress :   Social Connections: Socially Integrated  . Frequency of Communication with Friends and Family: More than three times a week  . Frequency of Social Gatherings with Friends and Family: Three times a week  . Attends Religious Services: More than 4 times per year  . Active Member of Clubs or Organizations: Yes  . Attends Archivist Meetings: More than 4 times per year  . Marital Status: Married  Human resources officer Violence:   . Fear of Current or Ex-Partner:   . Emotionally Abused:   Marland Kitchen Physically Abused:   . Sexually Abused:     Family History  Problem Relation Age of Onset   . Hypertension Father   . Asthma Mother   . Diabetes Maternal Aunt   . Diabetes Maternal Grandmother   . Kidney disease Neg Hx   . Bladder Cancer Neg Hx     The following portions of the patient's history were reviewed and updated as appropriate: allergies, current medications, past family history, past medical history, past social history, past surgical history and problem list.  Review of Systems Review of Systems - Negative except  as mentioned in HPI Review of Systems - General ROS: negative for - chills, fatigue, fever, hot flashes, malaise or night sweats Hematological and Lymphatic ROS: negative for - bleeding problems or swollen lymph nodes Gastrointestinal ROS: negative for - abdominal pain, blood in stools, change in bowel habits and nausea/vomiting Musculoskeletal ROS: negative for - joint pain, muscle pain or muscular weakness Genito-Urinary ROS: negative for - change in menstrual cycle, dysmenorrhea, dyspareunia, dysuria, genital discharge, genital ulcers, hematuria, incontinence, irregular/heavy menses, nocturia. Positive for pelvic pressure & pain, bladder pressure.   Objective:   Ht 5\' 5"  (1.651 m)   Wt 172 lb (78 kg)   LMP 04/26/2020 (Exact Date)   BMI 28.62 kg/m  CONSTITUTIONAL: Well-developed, well-nourished female in no acute distress.  HENT:  Normocephalic, atraumatic.  NECK: Normal range of motion, supple, no masses.  Normal thyroid.  SKIN: Skin is warm and dry. No rash noted. Not diaphoretic. No erythema. No pallor. [place]: Alert and oriented to person, place, and time. PSYCHIATRIC: Normal mood and affect. Normal behavior. Normal judgment and thought content. CARDIOVASCULAR:Not Examined RESPIRATORY: Not Examined BREASTS: Not Examined ABDOMEN: Soft, non distended; Non tender.  No Organomegaly. PELVIC:  External Genitalia: Normal  BUS: Normal  Vagina: Normal  Cervix: Normal  Uterus: Normal size, shape,consistency, mobile, tender with bimanual   Adnexa:  Normal  RV: Normal   Bladder: Nontender MUSCULOSKELETAL: Normal range of motion. No tenderness.  No cyanosis, clubbing, or edema.     Assessment:   1. Encounter for fertility planning STD testing Bladder pressure Pelvic pressure  Pelvic pain     Plan:   Labs ordered for STD testing. Urine culture collected. U/s ordered for pelvic pain/pressure. Discussed preconceptions counseling. She has history of hypertension on medications .Discussed hypertensive medications safe in pregnancy. Recommended discussing with PCP . Encouraged use of PNV, health diet , avoidance of alcohol, smoking , drugs . She verbalizes understanding and agrees to plan of care. Will follow up with results.   Philip Aspen, CNM

## 2020-05-23 LAB — HEPATITIS B SURFACE ANTIGEN: Hepatitis B Surface Ag: NEGATIVE

## 2020-05-23 LAB — CERVICOVAGINAL ANCILLARY ONLY
Bacterial Vaginitis (gardnerella): NEGATIVE
Candida Glabrata: NEGATIVE
Candida Vaginitis: NEGATIVE
Chlamydia: NEGATIVE
Comment: NEGATIVE
Comment: NEGATIVE
Comment: NEGATIVE
Comment: NEGATIVE
Comment: NEGATIVE
Comment: NORMAL
Neisseria Gonorrhea: NEGATIVE
Trichomonas: NEGATIVE

## 2020-05-23 LAB — RPR: RPR Ser Ql: NONREACTIVE

## 2020-05-23 LAB — HEPATITIS C ANTIBODY: Hep C Virus Ab: <0.1 s/co ratio (ref 0.0–0.9)

## 2020-05-23 LAB — HIV ANTIBODY (ROUTINE TESTING W REFLEX): HIV Screen 4th Generation wRfx: NONREACTIVE

## 2020-05-24 LAB — URINE CULTURE

## 2020-05-28 ENCOUNTER — Ambulatory Visit (INDEPENDENT_AMBULATORY_CARE_PROVIDER_SITE_OTHER): Payer: 59

## 2020-05-28 ENCOUNTER — Other Ambulatory Visit: Payer: Self-pay

## 2020-05-28 DIAGNOSIS — R102 Pelvic and perineal pain: Secondary | ICD-10-CM

## 2020-06-12 ENCOUNTER — Ambulatory Visit (INDEPENDENT_AMBULATORY_CARE_PROVIDER_SITE_OTHER): Payer: 59 | Admitting: Certified Nurse Midwife

## 2020-06-12 ENCOUNTER — Other Ambulatory Visit: Payer: Self-pay

## 2020-06-12 DIAGNOSIS — R300 Dysuria: Secondary | ICD-10-CM

## 2020-06-12 LAB — POCT URINALYSIS DIPSTICK
Bilirubin, UA: NEGATIVE
Blood, UA: NEGATIVE
Glucose, UA: NEGATIVE
Ketones, UA: NEGATIVE
Leukocytes, UA: NEGATIVE
Nitrite, UA: NEGATIVE
Protein, UA: NEGATIVE
Spec Grav, UA: 1.025 (ref 1.010–1.025)
Urobilinogen, UA: 0.2 E.U./dL
pH, UA: 6 (ref 5.0–8.0)

## 2020-06-12 NOTE — Patient Instructions (Signed)
Urinary Tract Infection, Adult A urinary tract infection (UTI) is an infection of any part of the urinary tract. The urinary tract includes:  The kidneys.  The ureters.  The bladder.  The urethra. These organs make, store, and get rid of pee (urine) in the body. What are the causes? This is caused by germs (bacteria) in your genital area. These germs grow and cause swelling (inflammation) of your urinary tract. What increases the risk? You are more likely to develop this condition if:  You have a small, thin tube (catheter) to drain pee.  You cannot control when you pee or poop (incontinence).  You are female, and: ? You use these methods to prevent pregnancy:  A medicine that kills sperm (spermicide).  A device that blocks sperm (diaphragm). ? You have low levels of a female hormone (estrogen). ? You are pregnant.  You have genes that add to your risk.  You are sexually active.  You take antibiotic medicines.  You have trouble peeing because of: ? A prostate that is bigger than normal, if you are female. ? A blockage in the part of your body that drains pee from the bladder (urethra). ? A kidney stone. ? A nerve condition that affects your bladder (neurogenic bladder). ? Not getting enough to drink. ? Not peeing often enough.  You have other conditions, such as: ? Diabetes. ? A weak disease-fighting system (immune system). ? Sickle cell disease. ? Gout. ? Injury of the spine. What are the signs or symptoms? Symptoms of this condition include:  Needing to pee right away (urgently).  Peeing often.  Peeing small amounts often.  Pain or burning when peeing.  Blood in the pee.  Pee that smells bad or not like normal.  Trouble peeing.  Pee that is cloudy.  Fluid coming from the vagina, if you are female.  Pain in the belly or lower back. Other symptoms include:  Throwing up (vomiting).  No urge to eat.  Feeling mixed up (confused).  Being tired  and grouchy (irritable).  A fever.  Watery poop (diarrhea). How is this treated? This condition may be treated with:  Antibiotic medicine.  Other medicines.  Drinking enough water. Follow these instructions at home:  Medicines  Take over-the-counter and prescription medicines only as told by your doctor.  If you were prescribed an antibiotic medicine, take it as told by your doctor. Do not stop taking it even if you start to feel better. General instructions  Make sure you: ? Pee until your bladder is empty. ? Do not hold pee for a long time. ? Empty your bladder after sex. ? Wipe from front to back after pooping if you are a female. Use each tissue one time when you wipe.  Drink enough fluid to keep your pee pale yellow.  Keep all follow-up visits as told by your doctor. This is important. Contact a doctor if:  You do not get better after 1-2 days.  Your symptoms go away and then come back. Get help right away if:  You have very bad back pain.  You have very bad pain in your lower belly.  You have a fever.  You are sick to your stomach (nauseous).  You are throwing up. Summary  A urinary tract infection (UTI) is an infection of any part of the urinary tract.  This condition is caused by germs in your genital area.  There are many risk factors for a UTI. These include having a small, thin   tube to drain pee and not being able to control when you pee or poop.  Treatment includes antibiotic medicines for germs.  Drink enough fluid to keep your pee pale yellow. This information is not intended to replace advice given to you by your health care provider. Make sure you discuss any questions you have with your health care provider. Document Revised: 09/15/2018 Document Reviewed: 04/07/2018 Elsevier Patient Education  2020 Elsevier Inc.  

## 2020-06-12 NOTE — Progress Notes (Signed)
Pt present for urine drop off. Pt states she feels a "pulling-aching" sensation and denies burning. Pt states it kept her up all night.

## 2020-06-14 LAB — URINE CULTURE

## 2020-09-02 ENCOUNTER — Encounter: Payer: 59 | Admitting: Certified Nurse Midwife

## 2020-10-08 ENCOUNTER — Encounter: Payer: Self-pay | Admitting: Certified Nurse Midwife

## 2020-10-08 ENCOUNTER — Ambulatory Visit (INDEPENDENT_AMBULATORY_CARE_PROVIDER_SITE_OTHER): Payer: 59 | Admitting: Certified Nurse Midwife

## 2020-10-08 ENCOUNTER — Other Ambulatory Visit: Payer: Self-pay

## 2020-10-08 VITALS — BP 138/105 | HR 67 | Ht 65.0 in | Wt 170.3 lb

## 2020-10-08 DIAGNOSIS — Z01419 Encounter for gynecological examination (general) (routine) without abnormal findings: Secondary | ICD-10-CM

## 2020-10-08 MED ORDER — VALACYCLOVIR HCL 500 MG PO TABS: ORAL_TABLET | ORAL | 6 refills | Status: DC

## 2020-10-08 NOTE — Progress Notes (Signed)
GYNECOLOGY ANNUAL PREVENTATIVE CARE ENCOUNTER NOTE  History:     Sonya Bauer is a 29 y.o. G52P1001 female here for a routine annual gynecologic exam.  Current complaints: none. Had COVID at the beginning of the year. Has had several issues since including BP, GI . She has appointment with GI this upcomming week, Has ENT appointment tomorrow and plans to make appt for PCP to manage BP.   Denies abnormal vaginal bleeding, discharge, pelvic pain, problems with intercourse or other gynecologic concerns.     Social Relationship: married , 2 kids Living:with family Work: Arboriculturist Exercise: none currently  Smoke/Alcohol/drug [id number]   Gynecologic History Patient's last menstrual period was 10/03/2020. Contraception: none, family planning Last Pap: 01/16/2020. Results were: normal  Last mammogram: n/a   The pregnancy intention screening data noted above was reviewed. Potential methods of contraception were discussed. The patient elected to proceed with No Method - Other Reason.   Obstetric History OB History  Gravida Para Term Preterm AB Living  2 1 1     1   SAB IAB Ectopic Multiple Live Births        0 1    # Outcome Date GA Lbr Len/2nd Weight Sex Delivery Anes PTL Lv  2 Gravida           1 Term 07/02/15 [redacted]w[redacted]d 05:50 / 00:14 6 lb 6.7 oz (2.91 kg) F Vag-Spont EPI  LIV    Past Medical History:  Diagnosis Date  . Complication of anesthesia   . Depression   . Family history of adverse reaction to anesthesia    MOM-HAD SEIZURE DURING SURGERY   . GERD (gastroesophageal reflux disease)    EVERY OTHER DAY  . Gestational diabetes   . Gluten intolerance 07/31/2019  . Headache   . Heart murmur    ASYMPTOMATIC  . Heartburn   . Hiatal hernia   . History of kidney stones   . Hypertension   . Kidney stones   . Motion sickness    long trips  . Pregnancy induced hypertension   . 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  CYSTOSCOPY WITH STENT PLACEMENT Left 10/06/2016   Procedure: CYSTOSCOPY WITH STENT PLACEMENT;  Surgeon: Alexis Frock, MD;  Location: [facility];  Service: Urology;  Laterality: Left;  . TONSILLECTOMY AND ADENOIDECTOMY N/A 05/20/2018   Procedure: TONSILLECTOMY;  Surgeon: Beverly Gust, MD;  Location: [place];  Service: ENT;  Laterality: N/A;  . URETEROSCOPY WITH HOLMIUM LASER LITHOTRIPSY Left 10/06/2016   Procedure: URETEROSCOPY WITH HOLMIUM LASER LITHOTRIPSY;  Surgeon: Alexis Frock, MD;  Location: [facility];  Service: Urology;  Laterality: Left;  . WISDOM TOOTH EXTRACTION     AGE 72    Current Outpatient Medications on File Prior to Visit  Medication Sig Dispense Refill  . Cholecalciferol (VITAMIN D3 PO) Take by mouth.    Marland Kitchen ibuprofen (ADVIL) 600 MG tablet Take 1 tablet (600 mg total) by mouth every 6 (six) hours. 30 tablet 0  . valACYclovir (VALTREX) 500 MG tablet TAKE 1 TABLET(500 MG) BY MOUTH TWICE DAILY 30 tablet 1  . B Complex-C (SUPER B COMPLEX PO) Take by mouth.     No current facility-administered medications on file prior to visit.    Allergies  Allergen Reactions  . Azithromycin Nausea And Vomiting    Social History:  reports that she has never smoked. She has never used smokeless tobacco. She reports current alcohol use. She  reports that she does not use drugs.  Family History  Problem Relation Age of Onset  . Hypertension Father   . Asthma Mother   . Diabetes Maternal Aunt   . Diabetes Maternal Grandmother   . Kidney disease Neg Hx   . Bladder Cancer Neg Hx     The following portions of the patient's history were reviewed and updated as appropriate: allergies, current medications, past family history, past medical history, past social history, past surgical history and problem list.  Review of Systems Pertinent items noted in HPI and remainder of comprehensive ROS otherwise negative.  Physical Exam:  BP (!) 138/105   Pulse 67   Ht 5\' 5"  (1.651 m)    Wt 170 lb 4.8 oz (77.2 kg)   LMP 10/03/2020   BMI 28.34 kg/m  CONSTITUTIONAL: Well-developed, well-nourished female in no acute distress.  HENT:  Normocephalic, atraumatic, External right and left ear normal. Oropharynx is clear and moist EYES: Conjunctivae and EOM are normal. Pupils are equal, round, and reactive to light. No scleral icterus.  NECK: Normal range of motion, supple, no masses.  Normal thyroid.  SKIN: Skin is warm and dry. No rash noted. Not diaphoretic. No erythema. No pallor. MUSCULOSKELETAL: Normal range of motion. No tenderness.  No cyanosis, clubbing, or edema.  2+ distal pulses. NEUROLOGIC: Alert and oriented to person, place, and time. Normal reflexes, muscle tone coordination.  PSYCHIATRIC: Normal mood and affect. Normal behavior. Normal judgment and thought content. CARDIOVASCULAR: Normal heart rate noted, regular rhythm RESPIRATORY: Clear to auscultation bilaterally. Effort and breath sounds normal, no problems with respiration noted. BREASTS: Symmetric in size. No masses, tenderness, skin changes, nipple drainage, or lymphadenopathy bilaterally.  ABDOMEN: Soft, no distention noted.  No tenderness, rebound or guarding.  PELVIC: Normal appearing external genitalia and urethral meatus; normal appearing vaginal mucosa and cervix.  No abnormal discharge noted.  Pap smear not indicated.  Normal uterine size, no other palpable masses, no uterine or adnexal tenderness.  .   Assessment and Plan:    1. Women's annual routine gynecological examination  Pap: not due Mammogram : n/a Labs: none Refills; valtrex Referral: none Routine preventative health maintenance measures emphasized. Please refer to After Visit Summary for other counseling recommendations.      10/05/2020, CNM [facility],  [facility]

## 2020-11-08 ENCOUNTER — Other Ambulatory Visit: Payer: Self-pay | Admitting: Gastroenterology

## 2020-11-08 DIAGNOSIS — R109 Unspecified abdominal pain: Secondary | ICD-10-CM

## 2020-11-26 ENCOUNTER — Other Ambulatory Visit: Payer: 59

## 2020-12-24 ENCOUNTER — Other Ambulatory Visit: Payer: 59

## 2021-03-19 ENCOUNTER — Ambulatory Visit (INDEPENDENT_AMBULATORY_CARE_PROVIDER_SITE_OTHER): Payer: BC Managed Care – PPO | Admitting: Certified Nurse Midwife

## 2021-03-19 ENCOUNTER — Encounter: Payer: Self-pay | Admitting: Certified Nurse Midwife

## 2021-03-19 ENCOUNTER — Other Ambulatory Visit: Payer: Self-pay

## 2021-03-19 ENCOUNTER — Other Ambulatory Visit: Payer: Self-pay | Admitting: Certified Nurse Midwife

## 2021-03-19 VITALS — BP 154/109 | HR 83 | Ht 65.0 in | Wt 176.7 lb

## 2021-03-19 DIAGNOSIS — N926 Irregular menstruation, unspecified: Secondary | ICD-10-CM

## 2021-03-19 DIAGNOSIS — N946 Dysmenorrhea, unspecified: Secondary | ICD-10-CM

## 2021-03-19 NOTE — Patient Instructions (Signed)
Abnormal Uterine Bleeding Abnormal uterine bleeding means bleeding more than usual from your womb (uterus). It can include:  Bleeding between menstrual periods.  Bleeding after sex.  Bleeding that is heavier than normal.  Menstrual periods that last longer than usual.  Bleeding after you have stopped having your menstrual period (menopause). There are many problems that may cause this. You should see a doctor for any kind of bleeding that is not normal. Treatment depends on the cause of the bleeding. Follow these instructions at home: Medicines  Take over-the-counter and prescription medicines only as told by your doctor.  Tell your doctor about other medicines that you take. ? If told by your doctor, stop taking aspirin or medicines that have aspirin in them. These medicines can make you bleed more.  You may be given iron pills to replace iron that your body loses because of this condition. Take them as told by your doctor. Managing constipation If you are taking iron pills, you may have trouble pooping (constipation). To prevent or treat trouble pooping, you may need to:  Drink enough fluid to keep your pee (urine) pale yellow.  Take over-the-counter or prescription medicines.  Eat foods that are high in fiber. These include beans, whole grains, and fresh fruits and vegetables.  Limit foods that are high in fat and sugar. These include fried or sweet foods. General instructions  Watch your condition for any changes.  Do not use tampons, douche, or have sex, if your doctor tells you not to.  Change your pads often.  Get regular exams. This includes pelvic exams and cervical cancer screenings. ? It is up to you to get the results of any tests that are done. Ask your doctor, or the department that is doing the tests, when your results will be ready.  Keep all follow-up visits as told by your doctor. This is important. Contact a doctor if:  The bleeding lasts more than 1  week.  You feel dizzy at times.  You feel like you may vomit (nausea).  You vomit.  You feel light-headed or weak.  Your symptoms get worse. Get help right away if:  You pass out.  You have to change pads every hour.  You have pain in your belly.  You have a fever or chills.  You get sweaty.  You get weak.  You pass large blood clots from your vagina. Summary  Abnormal uterine bleeding means bleeding more than usual from your womb (uterus).  Any kind of bleeding that is not normal should be checked by a doctor.  Treatment depends on the cause of the bleeding.  Get help right away if you pass out, you have to change pads every hour, or you pass large blood clots from your vagina. This information is not intended to replace advice given to you by your health care provider. Make sure you discuss any questions you have with your health care provider. Document Revised: 08/01/2019 Document Reviewed: 08/01/2019 Elsevier Patient Education  2021 Elsevier Inc.  

## 2021-03-19 NOTE — Progress Notes (Signed)
Patient is having heavy painful periods since she had covid. She has noticed the last couple months that her [place] has been off balance.

## 2021-03-19 NOTE — Progress Notes (Signed)
GYN ENCOUNTER NOTE  Subjective:       Sonya Bauer is a 30 y.o. G85P1001 female is here for gynecologic evaluation of the following issues:  1. irregylar heavy menstrual cycles since she had COVID. She states they are late some times, the come 2 times in a month some times, they are heavy for 5 days with passing of stringy clots, and they are painful 5-8/10 . Cause pain in her back , pelvic pressure, bloating, pain down her legs. States she has taken ibuprofen on occasion for pain .    Gynecologic History Patient's last menstrual period was 03/14/2021. Contraception: none Last Pap: 01/16/2020. Results were: normal Last mammogram: n/a  Obstetric History OB History  Gravida Para Term Preterm AB Living  2 1 1     1   SAB IAB Ectopic Multiple Live Births        0 1    # Outcome Date GA Lbr Len/2nd Weight Sex Delivery Anes PTL Lv  2 Gravida           1 Term 07/02/15 [redacted]w[redacted]d 05:50 / 00:14 6 lb 6.7 oz (2.91 kg) F Vag-Spont EPI  LIV    Past Medical History:  Diagnosis Date  . Complication of anesthesia   . Depression   . Family history of adverse reaction to anesthesia    MOM-HAD SEIZURE DURING SURGERY   . GERD (gastroesophageal reflux disease)    EVERY OTHER DAY  . Gestational diabetes   . Gluten intolerance 07/31/2019  . Headache   . Heart murmur    ASYMPTOMATIC  . Heartburn   . Hiatal hernia   . History of kidney stones   . Hypertension   . Kidney stones   . Motion sickness    long trips  . Pregnancy induced hypertension   . 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 CYSTOSCOPY WITH STENT PLACEMENT Left 10/06/2016   Procedure: CYSTOSCOPY WITH STENT PLACEMENT;  Surgeon: Alexis Frock, MD;  Location: [facility];  Service: Urology;  Laterality: Left;  . TONSILLECTOMY AND ADENOIDECTOMY N/A 05/20/2018   Procedure: TONSILLECTOMY;  Surgeon: Beverly Gust, MD;  Location: [facility];  Service: ENT;  Laterality: N/A;  . URETEROSCOPY WITH  HOLMIUM LASER LITHOTRIPSY Left 10/06/2016   Procedure: URETEROSCOPY WITH HOLMIUM LASER LITHOTRIPSY;  Surgeon: Alexis Frock, MD;  Location: [facility];  Service: Urology;  Laterality: Left;  . WISDOM TOOTH EXTRACTION     AGE 26    Current Outpatient Medications on File Prior to Visit  Medication Sig Dispense Refill  . B Complex-C (SUPER B COMPLEX PO) Take by mouth.    . cetirizine (ZYRTEC) 10 MG tablet Take 10 mg by mouth daily as needed.    . Cholecalciferol (VITAMIN D3 PO) Take by mouth.    Marland Kitchen ibuprofen (ADVIL) 600 MG tablet Take 1 tablet (600 mg total) by mouth every 6 (six) hours. 30 tablet 0  . ketoconazole (NIZORAL) 2 % shampoo Apply topically.    . pantoprazole (PROTONIX) 40 MG tablet Take 40 mg by mouth 2 (two) times daily.    . Probiotic Product (ALIGN) 4 MG CAPS 1 capsule    . valACYclovir (VALTREX) 500 MG tablet TAKE 1 TABLET(500 MG) BY MOUTH TWICE DAILY 30 tablet 6  . Cholecalciferol (VITAMIN D) 50 MCG (2000 UT) tablet 1 tablet (Patient not taking: Reported on 03/19/2021)    . famotidine (PEPCID) 20 MG tablet 1 tablet at bedtime as needed (Patient not taking:  Reported on 03/19/2021)    . fluticasone (FLONASE) 50 MCG/ACT nasal spray Place 1 spray into both nostrils every morning. (Patient not taking: Reported on 03/19/2021)    . lisinopril (ZESTRIL) 40 MG tablet 1 tablet (Patient not taking: Reported on 03/19/2021)    . losartan-hydrochlorothiazide (HYZAAR) 100-12.5 MG tablet Take 1 tablet by mouth daily.    . sucralfate (CARAFATE) 1 g tablet 1 tablet on an empty stomach (Patient not taking: Reported on 03/19/2021)     No current facility-administered medications on file prior to visit.    Allergies  Allergen Reactions  . Azithromycin Nausea And Vomiting    Social History   Socioeconomic History  . Marital status: Married    Spouse name: Not on file  . Number of children: 2  . Years of education: Not on file  . Highest education level: Bachelor's degree (e.g., BA, AB, BS)   Occupational History  . Occupation: unemployed   Tobacco Use  . Smoking status: Never Smoker  . Smokeless tobacco: Never Used  Vaping Use  . Vaping Use: Never used  Substance and Sexual Activity  . Alcohol use: Not Currently    Comment: may have 2-3 drinks 2x/month  . Drug use: No  . Sexual activity: Yes    Birth control/protection: None  Other Topics Concern  . Not on file  Social History Narrative   Patient is married and has two children ages 22yo and 9mo. 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 She reports having a supportive marriage, supportive family relationships, and has a few close friends.    Social Determinants of Health   Financial Resource Strain: Not on file  Food Insecurity: Not on file  Transportation Needs: Not on file  Physical Activity: Not on file  Stress: Not on file  Social Connections: Not on file  Intimate Partner Violence: Not on file    Family History  Problem Relation Age of Onset  . Hypertension Father   . Asthma Mother   . Diabetes Maternal Aunt   . Diabetes Maternal Grandmother   . Kidney disease Neg Hx   . Bladder Cancer Neg Hx   . Breast cancer Neg Hx     The following portions of the patient's history were reviewed and updated as appropriate: allergies, current medications, past family history, past medical history, past social history, past surgical history and problem list.  Review of Systems Review of Systems - Negative except as mentioned in HPI Review of Systems - General ROS: negative for - chills, fatigue, fever, hot flashes, malaise or night sweats Hematological and Lymphatic ROS: negative for - bleeding problems or swollen lymph nodes Gastrointestinal ROS: negative for - abdominal pain, blood in stools, change in bowel habits and nausea/vomiting Musculoskeletal ROS: negative for - joint pain, muscle pain or muscular weakness Genito-Urinary ROS: negative for - change in menstrual cycle, dysmenorrhea, dyspareunia, dysuria,  genital discharge, genital ulcers, hematuria, incontinence, irregular/heavy menses, nocturia. Positive for heavy , irregular , painful periods.   Objective:   BP (!) 154/109   Pulse 83   Ht 5\' 5"  (1.651 m)   Wt 176 lb 11.2 oz (80.2 kg)   LMP 03/14/2021   BMI 29.40 kg/m  CONSTITUTIONAL: Well-developed, well-nourished female in no acute distress.  HENT:  Normocephalic, atraumatic.  NECK: Normal range of motion, supple, no masses.  Normal thyroid.  SKIN: Skin is warm and dry. No rash noted. Not diaphoretic. No erythema. No pallor. [place]: Alert and oriented to person, place, and  time. PSYCHIATRIC: Normal mood and affect. Normal behavior. Normal judgment and thought content. CARDIOVASCULAR:Not Examined RESPIRATORY: Not Examined BREASTS: Not Examined ABDOMEN: Soft, non distended; Non tender.  No Organomegaly. PELVIC:  External Genitalia: Normal  BUS: Normal  Vagina: Normal  Cervix: Normal  Uterus:  Size- enlarged and tender, shape,consistency, mobile  Adnexa: Normal  RV: Normal   Bladder: Nontender MUSCULOSKELETAL: Normal range of motion. No tenderness.  No cyanosis, clubbing, or edema.     Assessment:   1. Irregular menses - FSH/LH - Prolactin - Testosterone - US PELVIC COMPLETE WITH TRANSVAGINAL; Future  2. Dysmenorrhea - US PELVIC COMPLETE WITH TRANSVAGINAL; Future     Plan:   Discussed management with BC, pt states she has taken [facility] in the past and has always disliked how it made her feel. She would like to avoid hormonal treatment. Discussed evaluation with blood work and u/s to r/o potential causes. She is in agreement. She state she can deal with it being heavier know that there is nothing wrong. Discussed use of advil and tylenol for pain . PT instructed to take 800 mg of Advil and 1000 of tylenol for the cramping. Also discussed use of toroidal should the Advil not give her relief. She verbalizes and agrees to plan. Order placed for labs: FSH/LH,  prolactin/testosterone and pelvic u/s with follow up with results.   Philip Aspen, CNM

## 2021-03-20 LAB — TESTOSTERONE: Testosterone: 17 ng/dL (ref 13–71)

## 2021-03-20 LAB — FSH/LH
FSH: 9.4 m[IU]/mL
LH: 4.6 m[IU]/mL

## 2021-03-20 LAB — PROLACTIN: Prolactin: 17.1 ng/mL (ref 4.8–23.3)

## 2021-04-18 ENCOUNTER — Ambulatory Visit: Payer: BC Managed Care – PPO

## 2021-05-08 ENCOUNTER — Other Ambulatory Visit: Payer: Self-pay

## 2021-05-09 NOTE — Telephone Encounter (Signed)
Spoke with patient in regards to [place]. Gave patient alternative option for [place] that may be more affordable, told her to reach out to them and if they were someone she was interested in having her [place] with to let us know.

## 2021-07-16 ENCOUNTER — Other Ambulatory Visit: Payer: Self-pay

## 2021-07-16 ENCOUNTER — Other Ambulatory Visit: Payer: Self-pay | Admitting: Certified Nurse Midwife

## 2021-07-16 ENCOUNTER — Ambulatory Visit (INDEPENDENT_AMBULATORY_CARE_PROVIDER_SITE_OTHER): Payer: BC Managed Care – PPO

## 2021-07-16 DIAGNOSIS — N946 Dysmenorrhea, unspecified: Secondary | ICD-10-CM

## 2021-07-16 DIAGNOSIS — N926 Irregular menstruation, unspecified: Secondary | ICD-10-CM

## 2021-09-09 ENCOUNTER — Ambulatory Visit: Payer: BC Managed Care – PPO | Admitting: Dermatology

## 2021-10-15 ENCOUNTER — Encounter: Payer: Self-pay | Admitting: Certified Nurse Midwife

## 2021-10-15 ENCOUNTER — Other Ambulatory Visit: Payer: Self-pay

## 2021-10-15 ENCOUNTER — Other Ambulatory Visit (HOSPITAL_COMMUNITY)
Admission: RE | Admit: 2021-10-15 | Discharge: 2021-10-15 | Disposition: A | Discharge status: Home / Self Care | Payer: Managed Care, Other (non HMO) | Source: Ambulatory Visit | Attending: Certified Nurse Midwife | Admitting: Certified Nurse Midwife

## 2021-10-15 ENCOUNTER — Ambulatory Visit (INDEPENDENT_AMBULATORY_CARE_PROVIDER_SITE_OTHER): Payer: Managed Care, Other (non HMO) | Admitting: Certified Nurse Midwife

## 2021-10-15 VITALS — BP 142/95 | HR 88 | Ht 65.0 in | Wt 186.2 lb

## 2021-10-15 DIAGNOSIS — Z01419 Encounter for gynecological examination (general) (routine) without abnormal findings: Secondary | ICD-10-CM | POA: Diagnosis not present

## 2021-10-15 NOTE — Progress Notes (Signed)
GYNECOLOGY ANNUAL PREVENTATIVE CARE ENCOUNTER NOTE  History:     Sonya Bauer is a 31 y.o. G66P1001 female here for a routine annual gynecologic exam.  Current complaints: vaginal discharge, mild odor.   Denies abnormal vaginal bleeding, discharge, pelvic pain, problems with intercourse or other gynecologic concerns.     Social Relationship: married Living:spouse and 2 chuldren  Work: Copywriter, advertising  Exercise:  none Smoke/Alcohol/drug use: rare alcohol use  Gynecologic History Patient's last menstrual period was 09/28/2021 (exact date). Contraception:  natural family planning Last Pap: 01/16/2020. Results were: normal  Last mammogram: n/a.    Upstream - 10/15/21 7322       Pregnancy Intention Screening   Does the patient want to become pregnant in the next year? No    Does the patient's partner want to become pregnant in the next year? No    Would the patient like to discuss contraceptive options today? No            The pregnancy intention screening data noted above was reviewed. Potential methods of contraception were discussed. The patient elected to proceed with N.F.P.   Obstetric History OB History  Gravida Para Term Preterm AB Living  2 1 1     1   SAB IAB Ectopic Multiple Live Births        0 1    # Outcome Date GA Lbr Len/2nd Weight Sex Delivery Anes PTL Lv  2 Gravida           1 Term 07/02/15 [redacted]w[redacted]d 05:50 / 00:14 6 lb 6.7 oz (2.91 kg) F Vag-Spont EPI  LIV    Past Medical History:  Diagnosis Date   Complication of anesthesia    Depression    Family history of adverse reaction to anesthesia    MOM-HAD SEIZURE DURING SURGERY    GERD (gastroesophageal reflux disease)    EVERY OTHER DAY   Gestational diabetes    Gluten intolerance 07/31/2019   Headache    Heart murmur    ASYMPTOMATIC   Heartburn    Hiatal hernia    History of kidney stones    Hypertension    Kidney stones    Motion sickness    long trips   Pregnancy induced hypertension     Thyroid cyst 11/16/14    Past Surgical History:  Procedure Laterality Date   CYSTOSCOPY WITH STENT PLACEMENT Left 10/06/2016   Procedure: CYSTOSCOPY WITH STENT PLACEMENT;  Surgeon: Alexis Frock, MD;  Location: [facility];  Service: Urology;  Laterality: Left;   TONSILLECTOMY AND ADENOIDECTOMY N/A 05/20/2018   Procedure: TONSILLECTOMY;  Surgeon: Beverly Gust, MD;  Location: [facility];  Service: ENT;  Laterality: N/A;   URETEROSCOPY WITH HOLMIUM LASER LITHOTRIPSY Left 10/06/2016   Procedure: URETEROSCOPY WITH HOLMIUM LASER LITHOTRIPSY;  Surgeon: Alexis Frock, MD;  Location: [facility];  Service: Urology;  Laterality: Left;   WISDOM TOOTH EXTRACTION     AGE 2    Current Outpatient Medications on File Prior to Visit  Medication Sig Dispense Refill   B Complex-C (SUPER B COMPLEX PO) Take by mouth.     cetirizine (ZYRTEC) 10 MG tablet Take 10 mg by mouth daily as needed.     Cholecalciferol (VITAMIN D3 PO) Take by mouth.     ibuprofen (ADVIL) 600 MG tablet Take 1 tablet (600 mg total) by mouth every 6 (six) hours. 30 tablet 0   ketoconazole (NIZORAL) 2 % shampoo Apply topically.     losartan-hydrochlorothiazide (HYZAAR) 100-12.5  MG tablet Take 1 tablet by mouth daily.     pantoprazole (PROTONIX) 40 MG tablet Take 40 mg by mouth 2 (two) times daily.     Probiotic Product (ALIGN) 4 MG CAPS 1 capsule     valACYclovir (VALTREX) 500 MG tablet TAKE 1 TABLET(500 MG) BY MOUTH TWICE DAILY 30 tablet 6   Cholecalciferol (VITAMIN D) 50 MCG (2000 UT) tablet 1 tablet (Patient not taking: Reported on 03/19/2021)     famotidine (PEPCID) 20 MG tablet 1 tablet at bedtime as needed (Patient not taking: Reported on 03/19/2021)     fluticasone (FLONASE) 50 MCG/ACT nasal spray Place 1 spray into both nostrils every morning. (Patient not taking: Reported on 10/15/2021)     lisinopril (ZESTRIL) 40 MG tablet 1 tablet (Patient not taking: Reported on 03/19/2021)     sucralfate (CARAFATE) 1 g tablet 1 tablet on  an empty stomach (Patient not taking: Reported on 03/19/2021)     No current facility-administered medications on file prior to visit.    Allergies  Allergen Reactions   Azithromycin Nausea And Vomiting    Social History:  reports that she has never smoked. She has never used smokeless tobacco. She reports that she does not currently use alcohol. She reports that she does not use drugs.  Family History  Problem Relation Age of Onset   Hypertension Father    Asthma Mother    Diabetes Maternal Aunt    Diabetes Maternal Grandmother    Kidney disease Neg Hx    Bladder Cancer Neg Hx    Breast cancer Neg Hx     The following portions of the patient's history were reviewed and updated as appropriate: allergies, current medications, past family history, past medical history, past social history, past surgical history and problem list.  Review of Systems Pertinent items noted in HPI and remainder of comprehensive ROS otherwise negative.  Physical Exam:  BP (!) 142/95    Pulse 88    Ht 5\' 5"  (1.651 m)    Wt 186 lb 3.2 oz (84.5 kg)    LMP 09/28/2021 (Exact Date)    BMI 30.99 kg/m  CONSTITUTIONAL: Well-developed, well-nourished female in no acute distress.  HENT:  Normocephalic, atraumatic, External right and left ear normal. Oropharynx is clear and moist EYES: Conjunctivae and EOM are normal. Pupils are equal, round, and reactive to light. No scleral icterus.  NECK: Normal range of motion, supple, no masses.  Normal thyroid.  SKIN: Skin is warm and dry. No rash noted. Not diaphoretic. No erythema. No pallor. MUSCULOSKELETAL: Normal range of motion. No tenderness.  No cyanosis, clubbing, or edema.  2+ distal pulses. NEUROLOGIC: Alert and oriented to person, place, and time. Normal reflexes, muscle tone coordination.  PSYCHIATRIC: Normal mood and affect. Normal behavior. Normal judgment and thought content. CARDIOVASCULAR: Normal heart rate noted, regular rhythm RESPIRATORY: Clear to  auscultation bilaterally. Effort and breath sounds normal, no problems with respiration noted. BREASTS: Symmetric in size. No masses, tenderness, skin changes, nipple drainage, or lymphadenopathy bilaterally.  ABDOMEN: Soft, no distention noted.  No tenderness, rebound or guarding.  PELVIC: Normal appearing external genitalia and urethral meatus; normal appearing vaginal mucosa and cervix.  White stringy discharge with mid odor noted.  Pap smear not due.  Normal uterine size, no other palpable masses, no uterine or adnexal tenderness.  .   Assessment and Plan:    1. Well woman exam with routine gynecological exam  Pap: not due Mammogram : n/a  Labs: vaginal swab  Refills: none Referral: none Routine preventative health maintenance measures emphasized. Please refer to After Visit Summary for other counseling recommendations.      Philip Aspen, CNM [facility]

## 2021-10-16 LAB — CERVICOVAGINAL ANCILLARY ONLY
Bacterial Vaginitis (gardnerella): NEGATIVE
Candida Glabrata: NEGATIVE
Candida Vaginitis: NEGATIVE
Comment: NEGATIVE
Comment: NEGATIVE
Comment: NEGATIVE

## 2021-10-28 ENCOUNTER — Encounter: Payer: Self-pay | Admitting: Certified Nurse Midwife

## 2021-10-29 ENCOUNTER — Ambulatory Visit (INDEPENDENT_AMBULATORY_CARE_PROVIDER_SITE_OTHER): Payer: Managed Care, Other (non HMO) | Admitting: Dermatology

## 2021-10-29 ENCOUNTER — Other Ambulatory Visit: Payer: Self-pay

## 2021-10-29 DIAGNOSIS — L920 Granuloma annulare: Secondary | ICD-10-CM

## 2021-10-29 DIAGNOSIS — L603 Nail dystrophy: Secondary | ICD-10-CM

## 2021-10-29 MED ORDER — TAVABOROLE 5 % EX SOLN: CUTANEOUS | 1 refills | Status: DC

## 2021-10-29 MED ORDER — CLOBETASOL PROPIONATE 0.05 % EX OINT: TOPICAL_OINTMENT | CUTANEOUS | 1 refills | Status: DC

## 2021-10-29 NOTE — Patient Instructions (Addendum)
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 Its cause is not known, and most episodes of granuloma annulare clear up after a few years, with or without treatment.    If You Need Anything After Your Visit  If you have any questions or concerns for your doctor, please call our main line at [phone] and press option 4 to reach your doctor's medical assistant. If no one answers, please leave a voicemail as directed and we will return your call as soon as possible. Messages left after 4 pm will be answered the following business day.   You may also send [place] a message via [place]. We typically respond to MyChart messages within 1-2 business days.  For prescription refills, please ask your pharmacy to contact our office. Our fax number is [phone].  If you have an urgent issue when the clinic is closed that cannot wait until the next business day, you can page your doctor at the number below.    Please note that while we do our best to be available for urgent issues outside of office hours, we are not available 24/7.   If you have an urgent issue and are unable to reach [place], you may choose to seek medical care at your doctor's office, retail clinic, urgent care center, or emergency room.  If you have a medical emergency, please immediately call 911 or go to the emergency department.  Pager Numbers  - Dr. Nehemiah Massed: [phone]  - Dr. Laurence Ferrari: [phone]  - Dr. Nicole Kindred: [phone]  In the event of inclement weather, please call our main line at [phone] for an update on the status of any delays or closures.  Dermatology Medication Tips: Please keep the boxes that topical medications come in in order to help keep track of the instructions about where and how to use these. Pharmacies typically print the medication instructions only on the boxes and  not directly on the medication tubes.   If your medication is too expensive, please contact our office at [phone] option 4 or send [place] a message through [place].   We are unable to tell what your co-pay for medications will be in advance as this is different depending on your insurance coverage. However, we may be able to find a substitute medication at lower cost or fill out paperwork to get insurance to cover a needed medication.   If a prior authorization is required to get your medication covered by your insurance company, please allow [place] 1-2 business days to complete this process.  Drug prices often vary depending on where the prescription is filled and some pharmacies may offer cheaper prices.  The website www.goodrx.com contains coupons for medications through different pharmacies. The prices here do not account for what the cost may be with help from insurance (it may be cheaper with your insurance), but the website can give you the price if you did not use any insurance.  - You can print the associated coupon and take it with your prescription to the pharmacy.  - You may also stop by our office during regular business hours and pick up a GoodRx coupon card.  - If you need your prescription sent electronically to a different pharmacy, notify our office through [facility] or by phone at [phone] option 4.     Si Usted Necesita Algo Despus de Su Visita  Tambin puede enviarnos  un mensaje a travs de MyChart. Por lo general respondemos a los mensajes de MyChart en el transcurso de 1 a 2 das hbiles.  Para renovar recetas, por favor pida a su farmacia que se ponga en contacto con nuestra oficina. Harland Dingwall de fax es [place] [phone].  Si tiene un asunto urgente cuando la clnica est cerrada y que no puede esperar hasta el siguiente da hbil, puede llamar/localizar a su doctor(a) al nmero que aparece a continuacin.   Por favor, tenga en cuenta que aunque  hacemos todo lo posible para estar disponibles para asuntos urgentes fuera del horario de [place], no estamos disponibles las 24 horas del da, los 7 das de la [place].   Si tiene un problema urgente y no puede comunicarse con nosotros, puede optar por buscar atencin mdica  en el consultorio de su doctor(a), en una clnica privada, en un centro de atencin urgente o en una sala de emergencias.  Si tiene Engineering geologist, por favor llame inmediatamente al 911 o vaya a la sala de emergencias.  Nmeros de bper  - Dr. Nehemiah Massed: [phone]  - Dra. Moye: [phone]  - Dra. Nicole Kindred: [phone]  En caso de inclemencias del [place], por favor llame a Johnsie Kindred principal al [phone] para una actualizacin sobre el [place] de cualquier retraso o cierre.  Consejos para la medicacin en dermatologa: Por favor, guarde las cajas en las que vienen los medicamentos de uso tpico para ayudarle a seguir las instrucciones sobre dnde y cmo usarlos. Las farmacias generalmente imprimen las instrucciones del medicamento slo en las cajas y no directamente en los tubos del [place].   Si su medicamento es muy caro, por favor, pngase en contacto con Zigmund Daniel llamando al [phone] y presione la opcin 4 o envenos un mensaje a travs de Pharmacist, community.   No podemos decirle cul ser su copago por los medicamentos por adelantado ya que esto es diferente dependiendo de la cobertura de su seguro. Sin embargo, es posible que podamos encontrar un medicamento sustituto a Electrical engineer un formulario para que el seguro cubra el medicamento que se considera necesario.   Si se requiere una autorizacin previa para que su compaa de seguros [place] su medicamento, por favor permtanos de 1 a 2 das hbiles para completar este proceso.  Los precios de los medicamentos varan con frecuencia dependiendo del Environmental consultant de dnde se surte la receta y alguna farmacias pueden ofrecer precios ms  baratos.  El sitio web www.goodrx.com tiene cupones para medicamentos de Airline pilot. Los precios aqu no tienen en cuenta lo que podra costar con la ayuda del seguro (puede ser ms barato con su seguro), pero el sitio web puede darle el precio si no utiliz Research scientist (physical sciences).  - Puede imprimir el cupn correspondiente y llevarlo con su receta a la farmacia.  - Tambin puede pasar por nuestra oficina durante el horario de atencin regular y Charity fundraiser una tarjeta de cupones de GoodRx.  - Si necesita que su receta se enve electrnicamente a una farmacia diferente, informe a nuestra oficina a travs de MyChart de [place] o por telfono llamando al [phone] y presione la opcin 4.

## 2021-10-29 NOTE — Progress Notes (Signed)
° °  New Patient Visit  Subjective  Sonya Bauer is a 31 y.o. female who presents for the following: Skin Problem (Area on left index fingertip red and painful since 2021. She has had 4 rounds of steroid injections from her previous dermatologist who said she had inflammation under the skin. Area had a callous after she was trying to get a cap off of a tire, she feels this is when problem started. The skin sheds and area is painful. Never goes away, but changes shapes. She has a similar spot on her left elbow. ) and Nail Problem (Left 5th toenail is dark for many years. No known history of trauma to this area. ).   The following portions of the chart were reviewed this encounter and updated as appropriate:       Review of Systems:  No other skin or systemic complaints except as noted in HPI or Assessment and Plan.  Objective  Well appearing patient in no apparent distress; mood and affect are within normal limits.  A focused examination was performed including hands, fingers, arm, toenail. Relevant physical exam findings are noted in the Assessment and Plan.  Left Palmar 2nd Finger Tip, L elbow Annular firm erythematous papules on the left index fingertip palmar; few pink flesh firm papules on the left elbow  Left 5th Toe Nail Distal Distal red/brown macule with pincer deformity and thickening. 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 Its cause is not known, and most episodes of granuloma annulare clear up after a few years, with or without treatment.   Start clobetasol ointment Apply to AA finger and elbow qhs until improved dsp 15g 1Rf. Avoid face, groin, axilla.   Also discussed IL steroid injection, but pt  defers  Topical steroids (such as triamcinolone, fluocinolone, fluocinonide, mometasone, clobetasol, halobetasol, betamethasone, hydrocortisone) can cause thinning and lightening of the skin if they are used for too long in the same area. Your physician has selected the right strength medicine for your problem and area affected on the body. Please use your medication only as directed by your physician to prevent side effects.     clobetasol ointment (TEMOVATE) 0.05 % - Left Palmar 2nd Finger Tip, L elbow Apply to affected area finger and left elbow every night followed by bandage. Use until improved. Avoid face, groin, underarms.  Nail dystrophy Left 5th Toe Nail Distal  Traumatic purpura vs Tinea unguium  Benign-appearing, recommend observation, RTC if changes Avoid tight-fitting shoes.  Start Kerydin Solution Apply to affected toenail qhs dsp 96mL 1Rf. Rx sent to [facility].    Tavaborole (KERYDIN) 5 % SOLN - Left 5th Toe Nail Distal Apply to affected toenail every night.   Return in about 2 months (around 12/27/2021) for TBSE, f/u toenail and GA.  IJamesetta Orleans, CMA, am acting as scribe for Brendolyn Patty, MD . Documentation: I have reviewed the above documentation for accuracy and completeness, and I agree with the above.  Brendolyn Patty MD

## 2021-10-31 ENCOUNTER — Other Ambulatory Visit: Payer: Self-pay

## 2021-10-31 ENCOUNTER — Ambulatory Visit (INDEPENDENT_AMBULATORY_CARE_PROVIDER_SITE_OTHER): Payer: Managed Care, Other (non HMO) | Admitting: Certified Nurse Midwife

## 2021-10-31 VITALS — Temp 98.2°F

## 2021-10-31 DIAGNOSIS — R350 Frequency of micturition: Secondary | ICD-10-CM

## 2021-10-31 DIAGNOSIS — R300 Dysuria: Secondary | ICD-10-CM | POA: Diagnosis not present

## 2021-10-31 LAB — POCT URINALYSIS DIPSTICK
Bilirubin, UA: NEGATIVE
Glucose, UA: NEGATIVE
Ketones, UA: NEGATIVE
Nitrite, UA: NEGATIVE
Protein, UA: POSITIVE — AB
Spec Grav, UA: 1.025 (ref 1.010–1.025)
Urobilinogen, UA: 0.2 E.U./dL
pH, UA: 6 (ref 5.0–8.0)

## 2021-10-31 NOTE — Progress Notes (Signed)
Patient was here for a urine drop off. She has been having some urinary frequency, dysuria and fullness of her bladder x 5 days. She has increased her water intake to help improve symptoms with no relief. Urine dip and urine culture was obtained. I recommended the follow per protocol to patient.   Increase water intake and add cranberry juice. Take extra strength Tylenol per label instructions or Ibuprofen 800 mg every 8 hours if you are not pregnant. AZO otc per label instructions. Start AZO after urine dip performed as it can contaminate specimen.

## 2021-11-03 ENCOUNTER — Telehealth: Payer: Self-pay | Admitting: Certified Nurse Midwife

## 2021-11-03 ENCOUNTER — Other Ambulatory Visit: Payer: Self-pay | Admitting: Certified Nurse Midwife

## 2021-11-03 ENCOUNTER — Encounter: Payer: Self-pay | Admitting: Certified Nurse Midwife

## 2021-11-03 DIAGNOSIS — N939 Abnormal uterine and vaginal bleeding, unspecified: Secondary | ICD-10-CM

## 2021-11-03 LAB — URINE CULTURE

## 2021-11-03 MED ORDER — NITROFURANTOIN MONOHYD MACRO 100 MG PO CAPS: 100.0000 mg | ORAL_CAPSULE | Freq: Two times a day (BID) | ORAL | 0 refills | Status: AC

## 2021-11-03 NOTE — Telephone Encounter (Signed)
Pt called asking to speak with Deneise Lever, states that she has been having blood clots/ tissue come out every time she goes to the bathroom. States the clots started yesterday and she is in pain. Pt was seen on 1/20

## 2021-11-03 NOTE — Telephone Encounter (Signed)
LM with patient to schedule pelvic US

## 2021-11-06 ENCOUNTER — Other Ambulatory Visit: Payer: Managed Care, Other (non HMO)

## 2022-01-05 ENCOUNTER — Ambulatory Visit: Payer: Managed Care, Other (non HMO) | Admitting: Dermatology

## 2022-02-18 ENCOUNTER — Encounter: Payer: Self-pay | Admitting: Dermatology

## 2022-02-18 ENCOUNTER — Ambulatory Visit: Payer: Managed Care, Other (non HMO) | Admitting: Dermatology

## 2022-02-18 DIAGNOSIS — L719 Rosacea, unspecified: Secondary | ICD-10-CM

## 2022-02-18 DIAGNOSIS — Z86018 Personal history of other benign neoplasm: Secondary | ICD-10-CM

## 2022-02-18 DIAGNOSIS — L920 Granuloma annulare: Secondary | ICD-10-CM

## 2022-02-18 DIAGNOSIS — D692 Other nonthrombocytopenic purpura: Secondary | ICD-10-CM

## 2022-02-18 DIAGNOSIS — L858 Other specified epidermal thickening: Secondary | ICD-10-CM

## 2022-02-18 DIAGNOSIS — L988 Other specified disorders of the skin and subcutaneous tissue: Secondary | ICD-10-CM

## 2022-02-18 DIAGNOSIS — L578 Other skin changes due to chronic exposure to nonionizing radiation: Secondary | ICD-10-CM

## 2022-02-18 DIAGNOSIS — L814 Other melanin hyperpigmentation: Secondary | ICD-10-CM

## 2022-02-18 DIAGNOSIS — Z1283 Encounter for screening for malignant neoplasm of skin: Secondary | ICD-10-CM

## 2022-02-18 DIAGNOSIS — D229 Melanocytic nevi, unspecified: Secondary | ICD-10-CM

## 2022-02-18 DIAGNOSIS — D225 Melanocytic nevi of trunk: Secondary | ICD-10-CM

## 2022-02-18 MED ORDER — METRONIDAZOLE 0.75 % EX CREA: TOPICAL_CREAM | CUTANEOUS | 5 refills | Status: DC

## 2022-02-18 NOTE — Patient Instructions (Addendum)
Rosacea ? ?What is rosacea? ?Rosacea (say: ro-zay-sha) is a common skin disease that usually begins as a trend of flushing or blushing easily.  As rosacea progresses, a persistent redness in the center of the face will develop and may gradually spread beyond the nose and cheeks to the forehead and chin.  In some cases, the ears, chest, and back could be affected.  Rosacea may appear as tiny blood vessels or small red bumps that occur in crops.  Frequently they can contain pus, and are called ?pustules?.  If the bumps do not contain pus, they are referred to as ?papules?.  Rarely, in prolonged, untreated cases of rosacea, the oil glands of the nose and cheeks may become permanently enlarged.  This is called rhinophyma, and is seen more frequently in men. ? ?Signs and Risks ?In its beginning stages, rosacea tends to come and go, which makes it difficult to recognize.  It can start as intermittent flushing of the face.  Eventually, blood vessels may become permanently visible.  Pustules and papules can appear, but can be mistaken for adult acne.  People of all races, ages, genders and ethnic groups are at risk of developing rosacea.  However, it is more common in women (especially around menopause) and adults with fair skin between the ages of 36 and 32. ? ?Treatment ?Dermatologists typically recommend a combination of treatments to effectively manage rosacea.  Treatment can improve symptoms and may stop the progression of the rosacea.  Treatment may involve both topical and oral medications.  The tetracycline antibiotics are often used for their anti-inflammatory effect; however, because of the possibility of developing antibiotic resistance, they should not be used long term at full dose.  For dilated blood vessels the options include electrodessication (uses electric current through a small needle), laser treatment, and cosmetics to hide the redness.   ?With all forms of treatment, improvement is a slow process, and  patients may not see any results for the first 3-4 weeks.  It is very important to avoid the sun and other triggers.  Patients must wear sunscreen daily. ? ?Skin Care Instructions: ?Cleanse the skin with a mild soap such as CeraVe cleanser, Cetaphil cleanser, or Dove soap once or twice daily as needed. ?Moisturize with Eucerin Redness Relief Daily Perfecting Lotion (has a subtle green tint), CeraVe Moisturizing Cream, or Oil of Olay Daily Moisturizer with sunscreen every morning and/or night as recommended. ?Makeup should be ?non-comedogenic? (won?t clog pores) and be labeled ?for sensitive skin?Kermit Balo choices for cosmetics are: Neutrogena, Almay, and Physician?s Formula.  Any product with a green tint tends to offset a red complexion. ?If your eyes are dry and irritated, use artificial tears 2-3 times per day and cleanse the eyelids daily with baby shampoo.  Have your eyes examined at least every 2 years.  Be sure to tell your eye doctor that you have rosacea. ?Alcoholic beverages tend to cause flushing of the skin, and may make rosacea worse. ?Always wear sunscreen, protect your skin from extreme hot and cold temperatures, and avoid spicy foods, hot drinks, and mechanical irritation such as rubbing, scrubbing, or massaging the face.  Avoid harsh skin cleansers, cleansing masks, astringents, and exfoliation. If a particular product burns or makes your face feel tight, then it is likely to flare your rosacea. ?If you are having difficulty finding a sunscreen that you can tolerate, you may try switching to a chemical-free sunscreen.  These are ones whose active ingredient is zinc oxide or titanium dioxide  only.  They should also be fragrance free, non-comedogenic, and labeled for sensitive skin. ?Rosacea triggers may vary from person to person.  There are a variety of foods that have been reported to trigger rosacea.  Some patients find that keeping a diary of what they were doing when they flared helps them avoid  triggers. ? ? ? ?Melanoma ABCDEs ? ?Melanoma is the most dangerous type of skin cancer, and is the leading cause of death from skin disease.  You are more likely to develop melanoma if you: ?Have light-colored skin, light-colored eyes, or red or blond hair ?Spend a lot of time in the sun ?Tan regularly, either outdoors or in a tanning bed ?Have had blistering sunburns, especially during childhood ?Have a close family member who has had a melanoma ?Have atypical moles or large birthmarks ? ?Early detection of melanoma is key since treatment is typically straightforward and cure rates are extremely high if we catch it early.  ? ?The first sign of melanoma is often a change in a mole or a new dark spot.  The ABCDE system is a way of remembering the signs of melanoma. ? ?A for asymmetry:  The two halves do not match. ?B for border:  The edges of the growth are irregular. ?C for color:  A mixture of colors are present instead of an even brown color. ?D for diameter:  Melanomas are usually (but not always) greater than 74m - the size of a pencil eraser. ?E for evolution:  The spot keeps changing in size, shape, and color. ? ?Please check your skin once per month between visits. You can use a small mirror in front and a large mirror behind you to keep an eye on the back side or your body.  ? ?If you see any new or changing lesions before your next follow-up, please call to schedule a visit. ? ?Please continue daily skin protection including broad spectrum sunscreen SPF 30+ to sun-exposed areas, reapplying every 2 hours as needed when you're outdoors.  ? ?Staying in the shade or wearing long sleeves, sun glasses (UVA+UVB protection) and wide brim hats (4-inch brim around the entire circumference of the hat) are also recommended for sun protection.   ? ? ?If You Need Anything After Your Visit ? ?If you have any questions or concerns for your doctor, please call our main line at [phone] press option 4 to reach your  doctor's medical assistant. If no one answers, please leave a voicemail as directed and we will return your call as soon as possible. Messages left after 4 pm will be answered the following business day.  ? ?You may also send [place] message via MyChart. We typically respond to MyChart messages within 1-2 business days. ? ?For prescription refills, please ask your pharmacy to contact our office. Our fax number is [phone] ? ?If you have an urgent issue when the clinic is closed that cannot wait until the next business day, you can page your doctor at the number below.   ? ?Please note that while we do our best to be available for urgent issues outside of office hours, we are not available 24/7.  ? ?If you have an urgent issue and are unable to reach [place] you may choose to seek medical care at your doctor's office, retail clinic, urgent care center, or emergency room. ? ?If you have a medical emergency, please immediately call 911 or go to the emergency department. ? ?Pager Numbers ? ?-  Dr. Nehemiah Massed: [phone] ? ?- Dr. Laurence Ferrari: [phone] ? ?- Dr. Nicole Kindred: [phone] ? ?In the event of inclement weather, please call our main line at [phone] for an update on the status of any delays or closures. ? ?Dermatology Medication Tips: ?Please keep the boxes that topical medications come in in order to help keep track of the instructions about where and how to use these. Pharmacies typically print the medication instructions only on the boxes and not directly on the medication tubes.  ? ?If your medication is too expensive, please contact our office at [phone] option 4 or send [place] a message through [place].  ? ?We are unable to tell what your co-pay for medications will be in advance as this is different depending on your insurance coverage. However, we may be able to find a substitute medication at lower cost or fill out paperwork to get insurance to cover a needed medication.  ? ?If a prior authorization is  required to get your medication covered by your insurance company, please allow [place] 1-2 business days to complete this process. ? ?Drug prices often vary depending on where the prescription is filled and so

## 2022-02-18 NOTE — Progress Notes (Signed)
? ?Follow-Up Visit ?  ?Subjective  ?Sonya Bauer is a 31 y.o. female who presents for the following: Follow-up. ? ?The patient presents for Total-Body Skin Exam (TBSE) for skin cancer screening and mole check.  The patient has spots, moles and lesions to be evaluated, some may be new or changing. She has a history of dysplastic nevus of the back excised 5-6 years ago at another office. She has GA of the left palmar 2nd fingertip and the left elbow. It improved with clobetasol ointment, but starting to come back a little now. Traumatic purpura vs tinea unguium of the left 5th toenail, patient didn't get Kerydin solution due to cost. She states nail is looking better. Patient also concerned about puffiness under her eyes.  ? ?The following portions of the chart were reviewed this encounter and updated as appropriate:  ?  ?  ? ?Review of Systems:  No other skin or systemic complaints except as noted in HPI or Assessment and Plan. ? ?Objective  ?Well appearing patient in no apparent distress; mood and affect are within normal limits. ? ?A full examination was performed including scalp, head, eyes, ears, nose, lips, neck, chest, axillae, abdomen, back, buttocks, bilateral upper extremities, bilateral lower extremities, hands, feet, fingers, toes, fingernails, and toenails. All findings within normal limits unless otherwise noted below. ? ?left index ingertip, left elbow, right elbow ?Firm white papule 2.0 mm of the left index fingertip; firm pink papules of the left elbow and 1 papule of the right elbow ? ?face ?Erythema on the cheeks, nose, and forehead with some small pink papules R > L ? ?L 5th toenail ?distal nail with fading light red/brown discoloration ? ?Left Abdomen ?2.5 mm medium dark brown macule ? ?bil infraocular ?Infraocular light violaceous discoloration with mild puffiness, and prominent tear trough ? ? ? ?Assessment & Plan  ?Granuloma annulare ?left index ingertip, left elbow, right  elbow ? ?Chronic and persistent condition with duration or expected duration over one year. Condition is symptomatic/ bothersome to patient. Not currently at goal.  ? ?Patient has had ILK injections in the past to elbows with improvement, but worsening of the fingertip. Patient would like to restart injections, but only to elbows.  ? ?Intralesional steroid injection side effects were reviewed including thinning of the skin and discoloration, such as redness, lightening or darkening. ? ?Continue clobetasol ointment to fingertip qd/bid. Caution skin atrophy with long-term use.  ? ?Granuloma annulare is a chronic benign skin condition characterized by pink smooth bumps or ring-like plaques most commonly appearing over the joints and the backs of the hands/feet. Its cause is not known, and most episodes of granuloma annulare clear up after a few years, with or without treatment.  ? ?Intralesional injection - left index ingertip, left elbow, right elbow ?Location: left and right elbow ? ?Informed Consent: Discussed risks (infection, pain, bleeding, bruising, thinning of the skin, loss of skin pigment, lack of resolution, and recurrence of lesion) and benefits of the procedure, as well as the alternatives. Informed consent was obtained. ?Preparation: The area was prepared a standard fashion. ? ?Procedure Details: An intralesional injection was performed with Kenalog 10 mg/cc. 0.2 cc in total were injected. ? ?Total number of injections: >7 ? ?Plan: The patient was instructed on post-op care. Recommend OTC analgesia as needed for pain. ? ?Lot: [id number] Exp 05/2023 ?[place] [id number] ? ?Related Medications ?clobetasol ointment (TEMOVATE) 0.05 % ?Apply to affected area finger and left elbow every night followed by bandage. Use until improved.  Avoid face, groin, underarms. ? ?Rosacea ?face ? ?Chronic and persistent condition with duration or expected duration over one year. Condition is bothersome/symptomatic for patient.  Currently flared.  ? ?Rosacea is a chronic progressive skin condition usually affecting the face of adults, causing redness and/or acne bumps. It is treatable but not curable. It sometimes affects the eyes (ocular rosacea) as well. It may respond to topical and/or systemic medication and can flare with stress, sun exposure, alcohol, exercise and some foods.  Daily application of broad spectrum spf 30+ sunscreen to face is recommended to reduce flares. Samples of CeraVe sunscreen. ? ?Start metronidazole 0.75% cream Apply to face qd/bid dsp 45g 5Rf. ? ?metroNIDAZOLE (METROCREAM) 0.75 % cream - face ?Apply to face once to twice daily for Rosacea. ? ?Other nonthrombocytopenic purpura ([place]) ?L 5th toenail ? ?Nail Purpura with fading. Improving.  ? ?Keep nail trimmed to avoid trauma. Observation.  ? ?Nevus ?Left Abdomen ? ?Benign-appearing.  Observation.  Call clinic for new or changing moles.  Recommend daily use of broad spectrum spf 30+ sunscreen to sun-exposed areas.  ? 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 ?With 'allergic shiners" ? ?Pt has a history of seasonal eye allergies. Recommend patient continue antihistamines daily. May try cool compresses to reduce puffiness. Discussed Clinical research associate Eye Treatment.  ?Discussed fillers to infraocular area. May schedule consult with Dr Laurence Ferrari if patient desires.  ?Recommend UV protective sunglasses ? ?Skin cancer screening performed today. ? ?Actinic Damage ?- chronic, secondary to cumulative UV radiation exposure/sun exposure over time ?- diffuse scaly erythematous macules with underlying dyspigmentation ?- Recommend daily broad spectrum sunscreen SPF 30+ to sun-exposed areas, reapply every 2 hours as needed.  ?- Recommend staying in the shade or wearing long sleeves, sun glasses (UVA+UVB protection) and wide brim hats (4-inch brim around the entire circumference of the hat). ?- Call for new or changing lesions. ? ?Lentigines ?- Scattered tan macules ?- Due to sun  exposure ?- Benign-appering, observe ?- Recommend daily broad spectrum sunscreen SPF 30+ to sun-exposed areas, reapply every 2 hours as needed. ?- Call for any changes ? ?Melanocytic Nevi ?- Tan-brown and/or pink-flesh-colored symmetric macules and papules, including flesh papules of the scalp ?- Benign appearing on exam today ?- Observation ?- Call clinic for new or changing moles ?- Recommend daily use of broad spectrum spf 30+ sunscreen to sun-exposed areas.  ? ?History of Dysplastic Nevus ?- No evidence of recurrence today ?- Recommend regular full body skin exams ?- Recommend daily broad spectrum sunscreen SPF 30+ to sun-exposed areas, reapply every 2 hours as needed.  ?- Call if any new or changing lesions are noted between office visits ? ?Keratosis Pilaris ?- Tiny follicular keratotic papules of the upper arms ?- Benign. Genetic in nature. No cure. ?- Observe. ?- If desired, patient can use an emollient (moisturizer) containing ammonium lactate, urea or salicylic acid once a day to smooth the area ? ?Return in about 1 year (around 02/19/2023) for TBSE. ? ?I, Jamesetta Orleans, CMA, am acting as scribe for Brendolyn Patty, MD . ?Documentation: I have reviewed the above documentation for accuracy and completeness, and I agree with the above. ? ?Brendolyn Patty MD  ? ? ?

## 2022-05-20 ENCOUNTER — Encounter (INDEPENDENT_AMBULATORY_CARE_PROVIDER_SITE_OTHER): Payer: Self-pay

## 2022-06-16 ENCOUNTER — Other Ambulatory Visit: Payer: Self-pay | Admitting: Certified Nurse Midwife

## 2022-07-27 ENCOUNTER — Encounter: Payer: Self-pay | Admitting: Obstetrics and Gynecology

## 2022-07-28 ENCOUNTER — Encounter: Payer: Self-pay | Admitting: Obstetrics and Gynecology

## 2022-09-30 ENCOUNTER — Telehealth: Payer: Self-pay | Admitting: Certified Nurse Midwife

## 2022-09-30 NOTE — Telephone Encounter (Signed)
Left message for patient to call office back to r/s appt for 10/20/2022 with AT

## 2022-10-16 NOTE — Telephone Encounter (Signed)
This patient has rescheduled for 1/26 with AT

## 2022-10-20 ENCOUNTER — Encounter: Payer: Self-pay | Admitting: Certified Nurse Midwife

## 2022-11-06 ENCOUNTER — Encounter: Payer: Self-pay | Admitting: Certified Nurse Midwife

## 2022-11-06 ENCOUNTER — Other Ambulatory Visit: Payer: Self-pay | Admitting: Certified Nurse Midwife

## 2022-11-06 ENCOUNTER — Ambulatory Visit (INDEPENDENT_AMBULATORY_CARE_PROVIDER_SITE_OTHER): Payer: BC Managed Care – PPO | Admitting: Certified Nurse Midwife

## 2022-11-06 ENCOUNTER — Other Ambulatory Visit (HOSPITAL_COMMUNITY)
Admission: RE | Admit: 2022-11-06 | Discharge: 2022-11-06 | Disposition: A | Discharge status: Home / Self Care | Payer: BC Managed Care – PPO | Source: Ambulatory Visit | Attending: Certified Nurse Midwife | Admitting: Certified Nurse Midwife

## 2022-11-06 VITALS — BP 181/125 | HR 67 | Resp 16 | Ht 65.0 in | Wt 186.3 lb

## 2022-11-06 DIAGNOSIS — Z124 Encounter for screening for malignant neoplasm of cervix: Secondary | ICD-10-CM

## 2022-11-06 DIAGNOSIS — Z01419 Encounter for gynecological examination (general) (routine) without abnormal findings: Secondary | ICD-10-CM

## 2022-11-06 DIAGNOSIS — I10 Essential (primary) hypertension: Secondary | ICD-10-CM

## 2022-11-06 MED ORDER — VALACYCLOVIR HCL 1 G PO TABS: 1000.0000 mg | ORAL_TABLET | Freq: Every day | ORAL | 3 refills | Status: DC

## 2022-11-06 NOTE — Patient Instructions (Signed)

## 2022-11-06 NOTE — Progress Notes (Signed)
GYNECOLOGY ANNUAL PREVENTATIVE CARE ENCOUNTER NOTE  History:     Sonya Bauer is a 32 y.o. G42P1001 female here for a routine annual gynecologic exam.  Current complaints: none, BP elevated today. Pt states she has not been taking her BP medication . Her husband is picking it up now. She admits that her dog past away this week which has really had her upset as well. .   Denies abnormal vaginal bleeding, discharge, pelvic pain, problems with intercourse or other gynecologic concerns.     Social Relationship: married  Living: with spouse and children Work: Copywriter, advertising  Exercise: none Smoke/Alcohol/drug use: rare alcohol use   Gynecologic History Patient's last menstrual period was 10/21/2022 (exact date). Contraception:  Natural family planning  Last Pap: 01/16/2020. Results were: normal Last mammogram: n/a    The pregnancy intention screening data noted above was reviewed. Potential methods of contraception were discussed. The patient elected to proceed with NFP.   Obstetric History OB History  Gravida Para Term Preterm AB Living  '2 1 1     1  '$ SAB IAB Ectopic Multiple Live Births        0 1    # Outcome Date GA Lbr Len/2nd Weight Sex Delivery Anes PTL Lv  2 Gravida           1 Term 07/02/15 6w2d05:50 / 00:14 6 lb 6.7 oz (2.91 kg) F Vag-Spont EPI  LIV    Past Medical History:  Diagnosis Date   Atypical mole 2017   back, dysplastic nevus, exc   Complication of anesthesia    Depression    Family history of adverse reaction to anesthesia    MOM-HAD SEIZURE DURING SURGERY    GERD (gastroesophageal reflux disease)    EVERY OTHER DAY   Gestational diabetes    Gluten intolerance 07/31/2019   Headache    Heart murmur    ASYMPTOMATIC   Heartburn    Hiatal hernia    History of kidney stones    Hypertension    Kidney stones    Motion sickness    long trips   Pregnancy induced hypertension    Thyroid cyst 11/16/2014    Past Surgical History:   Procedure Laterality Date   CYSTOSCOPY WITH STENT PLACEMENT Left 10/06/2016   Procedure: CYSTOSCOPY WITH STENT PLACEMENT;  Surgeon: TAlexis Frock MD;  Location: [facility];  Service: Urology;  Laterality: Left;   TONSILLECTOMY AND ADENOIDECTOMY N/A 05/20/2018   Procedure: TONSILLECTOMY;  Surgeon: MBeverly Gust MD;  Location: [facility]  Service: ENT;  Laterality: N/A;   URETEROSCOPY WITH HOLMIUM LASER LITHOTRIPSY Left 10/06/2016   Procedure: URETEROSCOPY WITH HOLMIUM LASER LITHOTRIPSY;  Surgeon: TAlexis Frock MD;  Location: [facility];  Service: Urology;  Laterality: Left;   WISDOM TOOTH EXTRACTION     AGE 89    Current Outpatient Medications on File Prior to Visit  Medication Sig Dispense Refill   cetirizine (ZYRTEC) 10 MG tablet Take 10 mg by mouth daily as needed.     Cholecalciferol (VITAMIN D3 PO) Take by mouth.     losartan-hydrochlorothiazide (HYZAAR) 100-12.5 MG tablet Take 1 tablet by mouth daily.     buPROPion (WELLBUTRIN XL) 150 MG 24 hr tablet Take 150 mg by mouth every morning. (Patient not taking: Reported on 11/06/2022)     clobetasol ointment (TEMOVATE) 0.05 % Apply to affected area finger and left elbow every night followed by bandage. Use until improved. Avoid face, groin,  underarms. (Patient not taking: Reported on 11/06/2022) 15 g 1   metroNIDAZOLE (METROCREAM) 0.75 % cream Apply to face once to twice daily for Rosacea. (Patient not taking: Reported on 11/06/2022) 45 g 5   Tavaborole (KERYDIN) 5 % SOLN Apply to affected toenail every night. (Patient not taking: Reported on 11/06/2022) 10 mL 1   valACYclovir (VALTREX) 500 MG tablet TAKE 1 TABLET(500 MG) BY MOUTH TWICE DAILY (Patient not taking: Reported on 11/06/2022) 30 tablet 6   No current facility-administered medications on file prior to visit.    Allergies  Allergen Reactions   Azithromycin Nausea And Vomiting    Social History:  reports that she has never smoked. She has never used smokeless tobacco.  She reports that she does not currently use alcohol. She reports that she does not use drugs.  Family History  Problem Relation Age of Onset   Hypertension Father    Asthma Mother    Diabetes Maternal Aunt    Diabetes Maternal Grandmother    Kidney disease Neg Hx    Bladder Cancer Neg Hx    Breast cancer Neg Hx     The following portions of the patient's history were reviewed and updated as appropriate: allergies, current medications, past family history, past medical history, past social history, past surgical history and problem list.  Review of Systems Pertinent items noted in HPI and remainder of comprehensive ROS otherwise negative.  Physical Exam:  BP (!) 194/131   Pulse 78   Resp 16   Ht '5\' 5"'$  (1.651 m)   Wt 186 lb 4.8 oz (84.5 kg)   LMP 10/21/2022 (Exact Date)   BMI 31.00 kg/m  CONSTITUTIONAL: Well-developed, well-nourished female in no acute distress.  HENT:  Normocephalic, atraumatic, External right and left ear normal. Oropharynx is clear and moist EYES: Conjunctivae and EOM are normal. Pupils are equal, round, and reactive to light. No scleral icterus.  NECK: Normal range of motion, supple, no masses.  Normal thyroid.  SKIN: Skin is warm and dry. No rash noted. Not diaphoretic. No erythema. No pallor. MUSCULOSKELETAL: Normal range of motion. No tenderness.  No cyanosis, clubbing, or edema.  2+ distal pulses. NEUROLOGIC: Alert and oriented to person, place, and time. Normal reflexes, muscle tone coordination.  PSYCHIATRIC: Normal mood and affect. Normal behavior. Normal judgment and thought content. CARDIOVASCULAR: Normal heart rate noted, regular rhythm RESPIRATORY: Clear to auscultation bilaterally. Effort and breath sounds normal, no problems with respiration noted. BREASTS: Symmetric in size. No masses, tenderness, skin changes, nipple drainage, or lymphadenopathy bilaterally.  ABDOMEN: Soft, no distention noted.  No tenderness, rebound or guarding.  PELVIC:  Normal appearing external genitalia and urethral meatus; normal appearing vaginal mucosa and cervix.  No abnormal discharge noted.  Pap smear obtained.  Normal uterine size, no other palpable masses, no uterine or adnexal tenderness.  .   Assessment and Plan:    1. Women's annual routine gynecological examination    Pap: Will follow up results of pap smear and manage accordingly. Mammogram :n/a Labs:  none  Refills: valtrex , daily suppression Referral: none  Encouraged regular use of BP medication. BP remains elevated with repeat. Suggest pt follow up today with PCP . Discussed warning signs and going to ED. She verbalizes and agrees.  Pt state she has noticed some odor/change in discharge after intercourse . Discussed use of boric acid vaginal suppository. Sample given Routine preventative health maintenance measures emphasized. Please refer to After Visit Summary for other counseling recommendations.  Philip Aspen, [facility]  [facility]

## 2022-11-06 NOTE — Addendum Note (Signed)
Addended by: Hildred Priest on: 11/06/2022 03:24 PM   Modules accepted: Orders

## 2022-11-11 ENCOUNTER — Other Ambulatory Visit: Payer: Self-pay | Admitting: Certified Nurse Midwife

## 2022-11-11 DIAGNOSIS — I10 Essential (primary) hypertension: Secondary | ICD-10-CM

## 2022-11-11 DIAGNOSIS — Z820 Family history of epilepsy and other diseases of the nervous system: Secondary | ICD-10-CM

## 2022-11-11 DIAGNOSIS — R5382 Chronic fatigue, unspecified: Secondary | ICD-10-CM

## 2022-11-11 DIAGNOSIS — G479 Sleep disorder, unspecified: Secondary | ICD-10-CM

## 2022-11-13 DIAGNOSIS — R112 Nausea with vomiting, unspecified: Secondary | ICD-10-CM | POA: Diagnosis not present

## 2022-11-13 DIAGNOSIS — H538 Other visual disturbances: Secondary | ICD-10-CM | POA: Diagnosis not present

## 2022-11-13 DIAGNOSIS — R5383 Other fatigue: Secondary | ICD-10-CM | POA: Diagnosis not present

## 2022-11-13 DIAGNOSIS — R519 Headache, unspecified: Secondary | ICD-10-CM | POA: Diagnosis not present

## 2022-11-13 DIAGNOSIS — I10 Essential (primary) hypertension: Secondary | ICD-10-CM | POA: Diagnosis not present

## 2022-11-13 LAB — CYTOLOGY - PAP
Comment: NEGATIVE
Diagnosis: NEGATIVE
High risk HPV: NEGATIVE

## 2022-12-07 ENCOUNTER — Other Ambulatory Visit: Payer: Self-pay | Admitting: Nurse Practitioner

## 2022-12-07 DIAGNOSIS — E663 Overweight: Secondary | ICD-10-CM

## 2022-12-07 DIAGNOSIS — R7301 Impaired fasting glucose: Secondary | ICD-10-CM

## 2022-12-07 DIAGNOSIS — R5383 Other fatigue: Secondary | ICD-10-CM

## 2022-12-08 DIAGNOSIS — E663 Overweight: Secondary | ICD-10-CM | POA: Diagnosis not present

## 2022-12-08 DIAGNOSIS — R5383 Other fatigue: Secondary | ICD-10-CM | POA: Diagnosis not present

## 2022-12-08 DIAGNOSIS — R7301 Impaired fasting glucose: Secondary | ICD-10-CM | POA: Diagnosis not present

## 2022-12-09 ENCOUNTER — Encounter: Payer: Self-pay | Admitting: Nurse Practitioner

## 2022-12-09 LAB — CMP14+EGFR
ALT: 43 IU/L — ABNORMAL HIGH (ref 0–32)
AST: 23 IU/L (ref 0–40)
Albumin/Globulin Ratio: 2.4 — ABNORMAL HIGH (ref 1.2–2.2)
Albumin: 4.7 g/dL (ref 3.9–4.9)
Alkaline Phosphatase: 87 IU/L (ref 44–121)
BUN/Creatinine Ratio: 21 (ref 9–23)
BUN: 15 mg/dL (ref 6–20)
Bilirubin Total: 0.5 mg/dL (ref 0.0–1.2)
CO2: 21 mmol/L (ref 20–29)
Calcium: 9.6 mg/dL (ref 8.7–10.2)
Chloride: 105 mmol/L (ref 96–106)
Creatinine, Ser: 0.73 mg/dL (ref 0.57–1.00)
Globulin, Total: 2 g/dL (ref 1.5–4.5)
Glucose: 88 mg/dL (ref 70–99)
Potassium: 4.3 mmol/L (ref 3.5–5.2)
Sodium: 140 mmol/L (ref 134–144)
Total Protein: 6.7 g/dL (ref 6.0–8.5)
eGFR: 113 mL/min/{1.73_m2} (ref >59)

## 2022-12-09 LAB — LIPID PANEL
Chol/HDL Ratio: 3.7 ratio (ref 0.0–4.4)
Cholesterol, Total: 168 mg/dL (ref 100–199)
HDL: 45 mg/dL (ref >39)
LDL Chol Calc (NIH): 102 mg/dL — ABNORMAL HIGH (ref 0–99)
Triglycerides: 117 mg/dL (ref 0–149)
VLDL Cholesterol Cal: 21 mg/dL (ref 5–40)

## 2022-12-09 LAB — HEMOGLOBIN A1C
Est. average glucose Bld gHb Est-mCnc: 97 mg/dL
Hgb A1c MFr Bld: 5 % (ref 4.8–5.6)

## 2022-12-09 LAB — TSH: TSH: 1.55 u[IU]/mL (ref 0.450–4.500)

## 2022-12-11 ENCOUNTER — Ambulatory Visit: Payer: BC Managed Care – PPO | Admitting: Nurse Practitioner

## 2022-12-11 VITALS — BP 140/88 | HR 84 | Ht 65.0 in | Wt 190.0 lb

## 2022-12-11 DIAGNOSIS — F411 Generalized anxiety disorder: Secondary | ICD-10-CM | POA: Diagnosis not present

## 2022-12-11 DIAGNOSIS — F32A Depression, unspecified: Secondary | ICD-10-CM | POA: Insufficient documentation

## 2022-12-11 DIAGNOSIS — R102 Pelvic and perineal pain: Secondary | ICD-10-CM | POA: Diagnosis not present

## 2022-12-11 DIAGNOSIS — N390 Urinary tract infection, site not specified: Secondary | ICD-10-CM | POA: Diagnosis not present

## 2022-12-11 LAB — POCT URINALYSIS DIPSTICK
Bilirubin, UA: NEGATIVE
Blood, UA: NEGATIVE
Glucose, UA: NEGATIVE
Ketones, UA: NEGATIVE
Nitrite, UA: NEGATIVE
Protein, UA: NEGATIVE
Spec Grav, UA: >=1.03 — AB (ref 1.010–1.025)
Urobilinogen, UA: 0.2 E.U./dL
pH, UA: 5.5 (ref 5.0–8.0)

## 2022-12-11 LAB — POCT URINE PREGNANCY: Preg Test, Ur: NEGATIVE

## 2022-12-11 MED ORDER — CIPROFLOXACIN HCL 500 MG PO TABS: 500.0000 mg | ORAL_TABLET | Freq: Two times a day (BID) | ORAL | 0 refills | Status: AC

## 2022-12-11 MED ORDER — BUPROPION HCL ER (XL) 150 MG PO TB24: 150.0000 mg | ORAL_TABLET | Freq: Every morning | ORAL | 1 refills | Status: DC

## 2022-12-11 NOTE — Patient Instructions (Signed)
1) UA abnormal today, cipro 2) Start wellbutrin 3) Follow up appt in 4-6 weeks, PHQ9 and GAD7 at that appt

## 2022-12-11 NOTE — Progress Notes (Signed)
Established Patient Office Visit  Subjective:  Patient ID: Sonya Bauer, female    DOB: 07/22/1991  Age: 32 y.o. MRN: [id number]  Chief Complaint  Patient presents with   Follow-up    Follow Up, Possible UTI    Follow up and Discuss labs as well as pt feels she has a UTI today so UA.  Signs of celiac with colonoscopy.  A1c at 5.0%.  LDL at 102 mg/dl.  Pt would like to start antidepressant medication, has thought about it for a while.       Past Medical History:  Diagnosis Date   Atypical mole 2017   back, dysplastic nevus, exc   Complication of anesthesia    Depression    Family history of adverse reaction to anesthesia    MOM-HAD SEIZURE DURING SURGERY    GERD (gastroesophageal reflux disease)    EVERY OTHER DAY   Gestational diabetes    Gluten intolerance 07/31/2019   Headache    Heart murmur    ASYMPTOMATIC   Heartburn    Hiatal hernia    History of kidney stones    Hypertension    Kidney stones    Motion sickness    long trips   Pregnancy induced hypertension    Thyroid cyst 11/16/2014      Family History  Problem Relation Age of Onset   Hypertension Father    Asthma Mother    Diabetes Maternal Aunt    Diabetes Maternal Grandmother    Kidney disease Neg Hx    Bladder Cancer Neg Hx    Breast cancer Neg Hx     Allergies  Allergen Reactions   Adalat Cc [Nifedipine Er]    Amoxicillin-Pot Clavulanate Nausea And Vomiting and Nausea Only    vomiting    Review of Systems  Constitutional: Negative.   HENT: Negative.    Eyes: Negative.   Respiratory: Negative.    Cardiovascular: Negative.   Gastrointestinal: Negative.   Genitourinary:  Positive for dysuria.  Musculoskeletal: Negative.   Skin: Negative.   Neurological: Negative.   Endo/Heme/Allergies: Negative.   Psychiatric/Behavioral:  Positive for depression.        Objective:   BP (!) 140/88   Pulse 84   Ht '5\' 5"'$  (1.651 m)   Wt 190 lb (86.2 kg)   SpO2 98%   BMI 31.62 kg/m    Vitals:   12/11/22 0916  BP: (!) 140/88  Pulse: 84  Height: '5\' 5"'$  (1.651 m)  Weight: 190 lb (86.2 kg)  SpO2: 98%  BMI (Calculated): 31.62    Physical Exam Vitals reviewed.  Constitutional:      Appearance: Normal appearance.  HENT:     Head: Normocephalic.     Nose: Nose normal.     Mouth/Throat:     Mouth: Mucous membranes are moist.  Eyes:     Pupils: Pupils are equal, round, and reactive to light.  Cardiovascular:     Rate and Rhythm: Normal rate and regular rhythm.  Pulmonary:     Effort: Pulmonary effort is normal.     Breath sounds: Normal breath sounds.  Abdominal:     General: Bowel sounds are normal.     Palpations: Abdomen is soft.  Musculoskeletal:        General: Normal range of motion.     Cervical back: Normal range of motion and neck supple.  Skin:    General: Skin is warm and dry.  Neurological:     Mental Status: She  is alert and oriented to person, place, and time.  Psychiatric:        Mood and Affect: Mood normal.        Behavior: Behavior normal.      Results for orders placed or performed in visit on 12/11/22  POCT Urinalysis Dipstick (81002)  Result Value Ref Range   Color, UA Yellow    Clarity, UA Slightly Cloudy    Glucose, UA Negative Negative   Bilirubin, UA Negative    Ketones, UA Negative    Spec Grav, UA >=1.030 (A) 1.010 - 1.025   Blood, UA Negative    pH, UA 5.5 5.0 - 8.0   Protein, UA Negative Negative   Urobilinogen, UA 0.2 0.2 or 1.0 E.U./dL   Nitrite, UA Negative    Leukocytes, UA Moderate (2+) (A) Negative   Appearance Yellow    Odor      Recent Results (from the past 2160 hour(s))  Cytology - PAP     Status: None   Collection Time: 11/06/22  3:24 PM  Result Value Ref Range   High risk HPV Negative    Adequacy      Satisfactory for evaluation; transformation zone component PRESENT.   Diagnosis      - Negative for intraepithelial lesion or malignancy (NILM)   Comment Normal Reference Range HPV - Negative    Hemoglobin A1c     Status: None   Collection Time: 12/08/22 11:17 AM  Result Value Ref Range   Hgb A1c MFr Bld 5.0 4.8 - 5.6 %    Comment:          Prediabetes: 5.7 - 6.4          Diabetes: >6.4          Glycemic control for adults with diabetes: <7.0    Est. average glucose Bld gHb Est-mCnc 97 mg/dL  TSH     Status: None   Collection Time: 12/08/22 11:17 AM  Result Value Ref Range   TSH 1.550 0.450 - 4.500 uIU/mL  CMP14+EGFR     Status: Abnormal   Collection Time: 12/08/22 11:17 AM  Result Value Ref Range   Glucose 88 70 - 99 mg/dL   BUN 15 6 - 20 mg/dL   Creatinine, Ser 0.73 0.57 - 1.00 mg/dL   eGFR 113 >59 mL/min/1.73   BUN/Creatinine Ratio 21 9 - 23   Sodium 140 134 - 144 mmol/L   Potassium 4.3 3.5 - 5.2 mmol/L   Chloride 105 96 - 106 mmol/L   CO2 21 20 - 29 mmol/L   Calcium 9.6 8.7 - 10.2 mg/dL   Total Protein 6.7 6.0 - 8.5 g/dL   Albumin 4.7 3.9 - 4.9 g/dL   Globulin, Total 2.0 1.5 - 4.5 g/dL   Albumin/Globulin Ratio 2.4 (H) 1.2 - 2.2   Bilirubin Total 0.5 0.0 - 1.2 mg/dL   Alkaline Phosphatase 87 44 - 121 IU/L   AST 23 0 - 40 IU/L   ALT 43 (H) 0 - 32 IU/L  Lipid panel     Status: Abnormal   Collection Time: 12/08/22 11:17 AM  Result Value Ref Range   Cholesterol, Total 168 100 - 199 mg/dL   Triglycerides 117 0 - 149 mg/dL   HDL 45 >39 mg/dL   VLDL Cholesterol Cal 21 5 - 40 mg/dL   LDL Chol Calc (NIH) 102 (H) 0 - 99 mg/dL   Chol/HDL Ratio 3.7 0.0 - 4.4 ratio    Comment:  T. Chol/HDL Ratio                                             Men  Women                               1/2 Avg.Risk  3.4    3.3                                   Avg.Risk  5.0    4.4                                2X Avg.Risk  9.6    7.1                                3X Avg.Risk 23.4   11.0   POCT Urinalysis Dipstick [id number])     Status: Abnormal   Collection Time: 12/11/22  9:26 AM  Result Value Ref Range   Color, UA Yellow    Clarity, UA Slightly Cloudy     Glucose, UA Negative Negative   Bilirubin, UA Negative    Ketones, UA Negative    Spec Grav, UA >=1.030 (A) 1.010 - 1.025   Blood, UA Negative    pH, UA 5.5 5.0 - 8.0   Protein, UA Negative Negative   Urobilinogen, UA 0.2 0.2 or 1.0 E.U./dL   Nitrite, UA Negative    Leukocytes, UA Moderate (2+) (A) Negative   Appearance Yellow    Odor        Assessment & Plan:   Problem List Items Addressed This Visit       Genitourinary   Urinary tract infection without hematuria   Relevant Medications   ciprofloxacin (CIPRO) 500 MG tablet     Other   Generalized anxiety disorder   Relevant Medications   buPROPion (WELLBUTRIN XL) 150 MG 24 hr tablet   Pelvic pain - Primary   Relevant Orders   POCT Urinalysis Dipstick [id number]) (Completed)   Depression   Relevant Medications   buPROPion (WELLBUTRIN XL) 150 MG 24 hr tablet    No follow-ups on file.   Total time spent: 30 minutes  Evern Bio, NP  12/11/2022

## 2022-12-26 NOTE — Progress Notes (Unsigned)
Cardiology Office Note  Date:  12/28/2022   ID:  Sonya Bauer, DOB [birth year], MRN [id number]  PCP:  Sonya Bio, NP   Chief Complaint  Patient presents with   New Patient (Initial Visit)    Ref by [facility] for uncontrolled HTN. Patient c/o palpitations & chest indigestion. Medications reviewed by the patient verbally.     HPI:  Sonya Bauer is a 32 year old woman with past medical history of Depression/anxiety Essential hypertension (strong family history hypertension) Who presents by referral from Sonya Bauer for consultation of her hypertension  Seen by primary care November 06, 2022 On that visit was not taking her blood pressure medication  On losartan HCTZ 100/12.5 daily  Recent visit with primary care earlier March 2024 blood pressure 140/88  Seen in the ER February 2024 blood pressure 159/112, in the setting of headache  Blood pressure November 06, 2022 OB/GYN pressure 181/125  Poor sleep, trouble getting alseep, staying asleep Hx of heart palpitations, appreciated more at rest  Prior medications used Ace cough Previously on HCTZ 25, amlodipine 10, nifedipine Manage the medications were changed when she got pregnant 2020 She is unaware of prior side effects, not listed in her allergy list  EKG personally reviewed by myself on todays visit Normal sinus rhythm rate 78 bpm no significant ST-T wave changes  PMH:   has a past medical history of Atypical mole (0000000), Complication of anesthesia, Depression, Family history of adverse reaction to anesthesia, GERD (gastroesophageal reflux disease), Gestational diabetes, Gluten intolerance (07/31/2019), Headache, Heart murmur, Heartburn, Hiatal hernia, History of kidney stones, Hypertension, Kidney stones, Motion sickness, Pregnancy induced hypertension, and Thyroid cyst (11/16/2014).  PSH:    Past Surgical History:  Procedure Laterality Date   CYSTOSCOPY WITH STENT PLACEMENT Left 10/06/2016    Procedure: CYSTOSCOPY WITH STENT PLACEMENT;  Surgeon: Alexis Frock, MD;  Location: [facility];  Service: Urology;  Laterality: Left;   TONSILLECTOMY AND ADENOIDECTOMY N/A 05/20/2018   Procedure: TONSILLECTOMY;  Surgeon: Beverly Gust, MD;  Location: [place];  Service: ENT;  Laterality: N/A;   URETEROSCOPY WITH HOLMIUM LASER LITHOTRIPSY Left 10/06/2016   Procedure: URETEROSCOPY WITH HOLMIUM LASER LITHOTRIPSY;  Surgeon: Alexis Frock, MD;  Location: [facility];  Service: Urology;  Laterality: Left;   WISDOM TOOTH EXTRACTION     AGE 2    Current Outpatient Medications  Medication Sig Dispense Refill   buPROPion (WELLBUTRIN XL) 150 MG 24 hr tablet Take 1 tablet (150 mg total) by mouth every morning. 90 tablet 1   cetirizine (ZYRTEC) 10 MG tablet Take 10 mg by mouth daily as needed.     Cholecalciferol (VITAMIN D3 PO) Take by mouth.     losartan-hydrochlorothiazide (HYZAAR) 100-12.5 MG tablet Take 1 tablet by mouth daily.     valACYclovir (VALTREX) 500 MG tablet TAKE 1 TABLET(500 MG) BY MOUTH TWICE DAILY 30 tablet 6   valACYclovir (VALTREX) 1000 MG tablet Take 1 tablet (1,000 mg total) by mouth daily. (Patient not taking: Reported on 12/28/2022) 90 tablet 3   No current facility-administered medications for this visit.     Allergies:   Azithromycin, Adalat cc [nifedipine er], and Amoxicillin-pot clavulanate   Social History:  The patient  reports that she has never smoked. She has never used smokeless tobacco. She reports that she does not currently use alcohol. She reports that she does not use drugs.   Family History:   family history includes Asthma in her mother; Diabetes in her maternal aunt and maternal grandmother; Hypertension  in her father.    Review of Systems: Review of Systems  Constitutional: Negative.   HENT: Negative.    Respiratory: Negative.    Cardiovascular: Negative.   Gastrointestinal: Negative.   Musculoskeletal: Negative.   Neurological: Negative.    Psychiatric/Behavioral: Negative.    All other systems reviewed and are negative.    PHYSICAL EXAM: VS:  BP (!) 130/110 (BP Location: Right Arm, Patient Position: Sitting, Cuff Size: Normal)   Pulse 78   Ht 5\' 5"  (1.651 m)   Wt 190 lb 2 oz (86.2 kg)   SpO2 98%   BMI 31.64 kg/m  , BMI Body mass index is 31.64 kg/m. GEN: Well nourished, well developed, in no acute distress HEENT: normal Neck: no JVD, carotid bruits, or masses Cardiac: RRR; no murmurs, rubs, or gallops,no edema  Respiratory:  clear to auscultation bilaterally, normal work of breathing GI: soft, nontender, nondistended, + BS MS: no deformity or atrophy Skin: warm and dry, no rash Neuro:  Strength and sensation are intact Psych: euthymic mood, full affect   Recent Labs: 12/08/2022: ALT 43; BUN 15; Creatinine, Ser 0.73; Potassium 4.3; Sodium 140; TSH 1.550    Lipid Panel Lab Results  Component Value Date   CHOL 168 12/08/2022   HDL 45 12/08/2022   LDLCALC 102 (H) 12/08/2022   TRIG 117 12/08/2022      Wt Readings from Last 3 Encounters:  12/28/22 190 lb 2 oz (86.2 kg)  12/11/22 190 lb (86.2 kg)  11/06/22 186 lb 4.8 oz (84.5 kg)       ASSESSMENT AND PLAN:  Problem List Items Addressed This Visit       Cardiology Problems   Essential hypertension - Primary     Other   Generalized anxiety disorder   Essential hypertension Chart reviewed, discussed prior medications used Previously on Nifedipine, amlodipine, HCTZ 25, ACE inhibitor (cough) Unclear if she had side effects on these medications, may have been held when she got pregnant 2020 Currently on losartan HCTZ 100/12.5 daily, blood pressure continues to run high at home Recommended she add spironolactone 25 mg daily Potassium running borderline low 3.7 She does report having palpitations, if needed could add beta-blocker such as bystolic Will avoid increasing HCTZ given polyuria  GAD Reports having issues with sleep, palpitations at  night May need sleep study Will defer to primary care   Patient seen in consultation for [facility] and will be referred back to her office for ongoing care of the issues detailed above  Signed, Sonya Bauer, M.D., Ph.D. [place], [place]

## 2022-12-28 ENCOUNTER — Encounter: Payer: Self-pay | Admitting: Cardiovascular Disease

## 2022-12-28 ENCOUNTER — Ambulatory Visit: Payer: BC Managed Care – PPO | Admitting: Internal Medicine

## 2022-12-28 ENCOUNTER — Ambulatory Visit: Payer: BC Managed Care – PPO | Attending: Cardiovascular Disease | Admitting: Cardiovascular Disease

## 2022-12-28 ENCOUNTER — Encounter: Payer: Self-pay | Admitting: Internal Medicine

## 2022-12-28 VITALS — BP 138/78 | HR 80 | Ht 65.0 in | Wt 190.0 lb

## 2022-12-28 VITALS — BP 130/110 | HR 78 | Ht 65.0 in | Wt 190.1 lb

## 2022-12-28 DIAGNOSIS — F411 Generalized anxiety disorder: Secondary | ICD-10-CM

## 2022-12-28 DIAGNOSIS — I10 Essential (primary) hypertension: Secondary | ICD-10-CM

## 2022-12-28 DIAGNOSIS — N390 Urinary tract infection, site not specified: Secondary | ICD-10-CM

## 2022-12-28 LAB — POCT URINALYSIS DIPSTICK
Bilirubin, UA: NEGATIVE
Blood, UA: NEGATIVE
Glucose, UA: NEGATIVE
Ketones, UA: NEGATIVE
Leukocytes, UA: NEGATIVE
Nitrite, UA: NEGATIVE
Protein, UA: NEGATIVE
Spec Grav, UA: 1.02 (ref 1.010–1.025)
Urobilinogen, UA: 0.2 E.U./dL
pH, UA: 7 (ref 5.0–8.0)

## 2022-12-28 MED ORDER — SPIRONOLACTONE 25 MG PO TABS: 25.0000 mg | ORAL_TABLET | Freq: Every day | ORAL | 1 refills | Status: DC

## 2022-12-28 MED ORDER — SULFAMETHOXAZOLE-TRIMETHOPRIM 400-80 MG PO TABS: 1.0000 | ORAL_TABLET | Freq: Two times a day (BID) | ORAL | 0 refills | Status: AC

## 2022-12-28 MED ORDER — SPIRONOLACTONE 25 MG PO TABS: 25.0000 mg | ORAL_TABLET | Freq: Every day | ORAL | 0 refills | Status: DC

## 2022-12-28 NOTE — Progress Notes (Signed)
Established Patient Office Visit  Subjective:  Patient ID: Sonya Bauer, female    DOB: [birth year]  Age: 32 y.o. MRN: [id number]  Chief Complaint  Patient presents with   Follow-up    UTI    Patient comes in with a few days history of painful micturition, bladder area pressure and discomfort, and bilateral flank pain.  Patient was seen earlier this month for a urinary tract infection and was prescribed p.o. Cipro which she took.  Patient states that her symptoms got better while she was on the antibiotic and soon after she finished she started having the same symptoms again, and they are progressively getting worse.  She has not seen any blood in her urine.  She does not have any complaints of nausea or vomiting, and no fever or chills. Although her urine dipstick done today in office is unremarkable, seems like she is developing symptoms of another urinary tract infection.  Will send in a prescription for Bactrim this time for 5 days. Patient's blood pressure is high, but she reports that she was seen by her cardiologist earlier today and a new medicine has been added to her regimen.    No other concerns at this time.   Past Medical History:  Diagnosis Date   Atypical mole 2017   back, dysplastic nevus, exc   Complication of anesthesia    Depression    Family history of adverse reaction to anesthesia    MOM-HAD SEIZURE DURING SURGERY    GERD (gastroesophageal reflux disease)    EVERY OTHER DAY   Gestational diabetes    Gluten intolerance 07/31/2019   Headache    Heart murmur    ASYMPTOMATIC   Heartburn    Hiatal hernia    History of kidney stones    Hypertension    Kidney stones    Motion sickness    long trips   Pregnancy induced hypertension    Thyroid cyst 11/16/2014    Past Surgical History:  Procedure Laterality Date   CYSTOSCOPY WITH STENT PLACEMENT Left 10/06/2016   Procedure: CYSTOSCOPY WITH STENT PLACEMENT;  Surgeon: Alexis Frock, MD;  Location: [facility];  Service: Urology;  Laterality: Left;   TONSILLECTOMY AND ADENOIDECTOMY N/A 05/20/2018   Procedure: TONSILLECTOMY;  Surgeon: Beverly Gust, MD;  Location: [place];  Service: ENT;  Laterality: N/A;   URETEROSCOPY WITH HOLMIUM LASER LITHOTRIPSY Left 10/06/2016   Procedure: URETEROSCOPY WITH HOLMIUM LASER LITHOTRIPSY;  Surgeon: Alexis Frock, MD;  Location: [facility];  Service: Urology;  Laterality: Left;   WISDOM TOOTH EXTRACTION     AGE 53    Social History   Socioeconomic History   Marital status: Married    Spouse name: Not on file   Number of children: 2   Years of education: Not on file   Highest education level: Bachelor's degree (e.g., BA, AB, BS)  Occupational History   Occupation: unemployed   Tobacco Use   Smoking status: Never   Smokeless tobacco: Never  Vaping Use   Vaping Use: Never used  Substance and Sexual Activity   Alcohol use: Not Currently    Comment: may have 2-3 drinks 2x/month   Drug use: No   Sexual activity: Yes    Birth control/protection: None  Other Topics Concern   Not on file  Social History Narrative   Patient is married and has two children ages 65yo and 14mo. 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 She reports having a supportive marriage, supportive  family relationships, and has a few close friends.    Social Determinants of Health   Financial Resource Strain: Not on file  Food Insecurity: Not on file  Transportation Needs: Not on file  Physical Activity: Not on file  Stress: Not on file  Social Connections: Socially Integrated (02/05/2020)   Social Connection and Isolation Panel [NHANES]    Frequency of Communication with Friends and Family: More than three times a week    Frequency of Social Gatherings with Friends and Family: Three times a week    Attends Religious Services: More than 4 times per year    Active Member of Clubs or Organizations: Yes    Attends Music therapist: More than 4 times per year     Marital Status: Married  Human resources officer Violence: Not on file    Family History  Problem Relation Age of Onset   Hypertension Father    Asthma Mother    Diabetes Maternal Aunt    Diabetes Maternal Grandmother    Kidney disease Neg Hx    Bladder Cancer Neg Hx    Breast cancer Neg Hx     Allergies  Allergen Reactions   Azithromycin Nausea And Vomiting, Diarrhea and Other (See Comments)   Adalat Cc [Nifedipine Er]    Amoxicillin-Pot Clavulanate Nausea And Vomiting and Nausea Only    vomiting    Review of Systems  Constitutional:  Positive for malaise/fatigue. Negative for chills, diaphoresis, fever and weight loss.  HENT: Negative.    Eyes: Negative.   Respiratory: Negative.    Cardiovascular:  Negative for chest pain, palpitations, orthopnea, claudication and leg swelling.  Gastrointestinal:  Negative for abdominal pain, constipation, diarrhea, heartburn, nausea and vomiting.  Genitourinary:  Positive for dysuria, flank pain, frequency and urgency. Negative for hematuria.  Musculoskeletal:  Negative for back pain, falls, joint pain and myalgias.  Neurological: Negative.   Psychiatric/Behavioral: Negative.         Objective:   BP 138/78   Pulse 80   Ht 5\' 5"  (1.651 m)   Wt 190 lb (86.2 kg)   SpO2 99%   BMI 31.62 kg/m   Vitals:   12/28/22 1000  BP: 138/78  Pulse: 80  Height: 5\' 5"  (1.651 m)  Weight: 190 lb (86.2 kg)  SpO2: 99%  BMI (Calculated): 31.62    Physical Exam   Results for orders placed or performed in visit on 12/28/22  POCT Urinalysis Dipstick (81002)  Result Value Ref Range   Color, UA     Clarity, UA     Glucose, UA Negative Negative   Bilirubin, UA Negative    Ketones, UA Negative    Spec Grav, UA 1.020 1.010 - 1.025   Blood, UA Negative    pH, UA 7.0 5.0 - 8.0   Protein, UA Negative Negative   Urobilinogen, UA 0.2 0.2 or 1.0 E.U./dL   Nitrite, UA Negative    Leukocytes, UA Negative Negative   Appearance Slighty Cloudy    Odor       Recent Results (from the past 2160 hour(s))  Cytology - PAP     Status: None   Collection Time: 11/06/22  3:24 PM  Result Value Ref Range   High risk HPV Negative    Adequacy      Satisfactory for evaluation; transformation zone component PRESENT.   Diagnosis      - Negative for intraepithelial lesion or malignancy (NILM)   Comment Normal Reference Range HPV - Negative  Hemoglobin A1c     Status: None   Collection Time: 12/08/22 11:17 AM  Result Value Ref Range   Hgb A1c MFr Bld 5.0 4.8 - 5.6 %    Comment:          Prediabetes: 5.7 - 6.4          Diabetes: >6.4          Glycemic control for adults with diabetes: <7.0    Est. average glucose Bld gHb Est-mCnc 97 mg/dL  TSH     Status: None   Collection Time: 12/08/22 11:17 AM  Result Value Ref Range   TSH 1.550 0.450 - 4.500 uIU/mL  CMP14+EGFR     Status: Abnormal   Collection Time: 12/08/22 11:17 AM  Result Value Ref Range   Glucose 88 70 - 99 mg/dL   BUN 15 6 - 20 mg/dL   Creatinine, Ser 0.73 0.57 - 1.00 mg/dL   eGFR 113 >59 mL/min/1.73   BUN/Creatinine Ratio 21 9 - 23   Sodium 140 134 - 144 mmol/L   Potassium 4.3 3.5 - 5.2 mmol/L   Chloride 105 96 - 106 mmol/L   CO2 21 20 - 29 mmol/L   Calcium 9.6 8.7 - 10.2 mg/dL   Total Protein 6.7 6.0 - 8.5 g/dL   Albumin 4.7 3.9 - 4.9 g/dL   Globulin, Total 2.0 1.5 - 4.5 g/dL   Albumin/Globulin Ratio 2.4 (H) 1.2 - 2.2   Bilirubin Total 0.5 0.0 - 1.2 mg/dL   Alkaline Phosphatase 87 44 - 121 IU/L   AST 23 0 - 40 IU/L   ALT 43 (H) 0 - 32 IU/L  Lipid panel     Status: Abnormal   Collection Time: 12/08/22 11:17 AM  Result Value Ref Range   Cholesterol, Total 168 100 - 199 mg/dL   Triglycerides 117 0 - 149 mg/dL   HDL 45 >39 mg/dL   VLDL Cholesterol Cal 21 5 - 40 mg/dL   LDL Chol Calc (NIH) 102 (H) 0 - 99 mg/dL   Chol/HDL Ratio 3.7 0.0 - 4.4 ratio    Comment:                                   T. Chol/HDL Ratio                                             Men  Women                                1/2 Avg.Risk  3.4    3.3                                   Avg.Risk  5.0    4.4                                2X Avg.Risk  9.6    7.1                                3X Avg.Risk 23.4  11.0   POCT Urinalysis Dipstick [id number])     Status: Abnormal   Collection Time: 12/11/22  9:26 AM  Result Value Ref Range   Color, UA Yellow    Clarity, UA Slightly Cloudy    Glucose, UA Negative Negative   Bilirubin, UA Negative    Ketones, UA Negative    Spec Grav, UA >=1.030 (A) 1.010 - 1.025   Blood, UA Negative    pH, UA 5.5 5.0 - 8.0   Protein, UA Negative Negative   Urobilinogen, UA 0.2 0.2 or 1.0 E.U./dL   Nitrite, UA Negative    Leukocytes, UA Moderate (2+) (A) Negative   Appearance Yellow    Odor    POCT urine pregnancy     Status: Normal   Collection Time: 12/11/22  9:39 AM  Result Value Ref Range   Preg Test, Ur Negative Negative  POCT Urinalysis Dipstick [id number])     Status: Normal   Collection Time: 12/28/22 10:06 AM  Result Value Ref Range   Color, UA     Clarity, UA     Glucose, UA Negative Negative   Bilirubin, UA Negative    Ketones, UA Negative    Spec Grav, UA 1.020 1.010 - 1.025   Blood, UA Negative    pH, UA 7.0 5.0 - 8.0   Protein, UA Negative Negative   Urobilinogen, UA 0.2 0.2 or 1.0 E.U./dL   Nitrite, UA Negative    Leukocytes, UA Negative Negative   Appearance Slighty Cloudy    Odor        Assessment & Plan:  Patient advised to increase her water intake.  Prescription of Bactrim has been sent to the pharmacy. Patient will let us know if her symptoms get worse. Problem List Items Addressed This Visit     Essential hypertension   Generalized anxiety disorder   Urinary tract infection without hematuria - Primary   Relevant Medications   sulfamethoxazole-trimethoprim (BACTRIM) 400-80 MG tablet   Other Relevant Orders   POCT Urinalysis Dipstick [id number]) (Completed)    Return in about 2 weeks (around 01/11/2023).   Total time spent: 25  minutes  Perrin Maltese, MD  12/28/2022

## 2022-12-28 NOTE — Patient Instructions (Addendum)
Please call with BP numbers  Medication Instructions:  Please start spironolactone 25 mg daily  If you need a refill on your cardiac medications before your next appointment, please call your pharmacy.   Lab work: No new labs needed  Testing/Procedures: No new testing needed  Follow-Up: At [facility], you and your health needs are our priority.  As part of our continuing mission to provide you with exceptional heart care, we have created designated Provider Care Teams.  These Care Teams include your primary Cardiologist (physician) and Advanced Practice Providers (APPs -  Physician Assistants and Nurse Practitioners) who all work together to provide you with the care you need, when you need it.  You will need a follow up appointment in 6 months  Providers on your designated Care Team:   Murray Hodgkins, NP Christell Faith, PA-C Cadence Kathlen Mody, [place]  COVID-19 Vaccine Information can be found at: [email or web address].uk For questions related to vaccine distribution or appointments, please email vaccine@[email or web address] .[email or web address] or call [phone].

## 2022-12-29 ENCOUNTER — Encounter: Payer: Self-pay | Admitting: Nurse Practitioner

## 2022-12-31 ENCOUNTER — Other Ambulatory Visit: Payer: Self-pay | Admitting: Nurse Practitioner

## 2023-01-11 ENCOUNTER — Encounter: Payer: Self-pay | Admitting: Nurse Practitioner

## 2023-01-11 ENCOUNTER — Ambulatory Visit: Payer: BC Managed Care – PPO | Admitting: Nurse Practitioner

## 2023-01-15 ENCOUNTER — Encounter: Payer: Self-pay | Admitting: Nurse Practitioner

## 2023-01-15 ENCOUNTER — Ambulatory Visit: Payer: BC Managed Care – PPO | Admitting: Nurse Practitioner

## 2023-01-15 VITALS — BP 148/112 | HR 98 | Ht 65.0 in | Wt 189.8 lb

## 2023-01-15 DIAGNOSIS — F331 Major depressive disorder, recurrent, moderate: Secondary | ICD-10-CM | POA: Diagnosis not present

## 2023-01-15 DIAGNOSIS — I10 Essential (primary) hypertension: Secondary | ICD-10-CM | POA: Diagnosis not present

## 2023-01-15 MED ORDER — LOSARTAN POTASSIUM-HCTZ 100-12.5 MG PO TABS: 1.0000 | ORAL_TABLET | Freq: Every day | ORAL | 3 refills | Status: DC

## 2023-01-15 NOTE — Patient Instructions (Signed)
1) two BP readings next week 2) trial of mg 250 mg nightly by itself x 7-10 days 3) MyChart message me both the BP readings and if magnesium is effective 4) Refill of losartan/HCTZ at [facility] 90 days w 3 ref 5) Follow up appt in July have fasting labs 3 days prior

## 2023-01-15 NOTE — Progress Notes (Signed)
Established Patient Office Visit  Subjective:  Patient ID: Sonya Bauer, female    DOB: 01/23/1991  Age: 32 y.o. MRN: [id number]  Chief Complaint  Patient presents with   Follow-up    Wellbutrin and BP Follow up    4 week follow up.  BP elevated but pt needs refill of losartan/HCTZ which is not currently on board today.  [facility]      No other concerns at this time.   Past Medical History:  Diagnosis Date   Atypical mole 2017   back, dysplastic nevus, exc   Complication of anesthesia    Depression    Family history of adverse reaction to anesthesia    MOM-HAD SEIZURE DURING SURGERY    GERD (gastroesophageal reflux disease)    EVERY OTHER DAY   Gestational diabetes    Gluten intolerance 07/31/2019   Headache    Heart murmur    ASYMPTOMATIC   Heartburn    Hiatal hernia    History of kidney stones    Hypertension    Kidney stones    Motion sickness    long trips   Pregnancy induced hypertension    Thyroid cyst 11/16/2014    Past Surgical History:  Procedure Laterality Date   CYSTOSCOPY WITH STENT PLACEMENT Left 10/06/2016   Procedure: CYSTOSCOPY WITH STENT PLACEMENT;  Surgeon: Sebastian Acheheodore Manny, MD;  Location: [facility];  Service: Urology;  Laterality: Left;   TONSILLECTOMY AND ADENOIDECTOMY N/A 05/20/2018   Procedure: TONSILLECTOMY;  Surgeon: Linus SalmonsMcQueen, Chapman, MD;  [facility];  Service: ENT;  Laterality: N/A;   URETEROSCOPY WITH HOLMIUM LASER LITHOTRIPSY Left 10/06/2016   Procedure: URETEROSCOPY WITH HOLMIUM LASER LITHOTRIPSY;  Surgeon: Sebastian Acheheodore Manny, MD;  Location: [facility];  Service: Urology;  Laterality: Left;   WISDOM TOOTH EXTRACTION     AGE 88    Social History   Socioeconomic History   Marital status: Married    Spouse name: Not on file   Number of children: 2   Years of education: Not on file   Highest education level: Bachelor's degree (e.g., BA, AB, BS)  Occupational History    Occupation: unemployed   Tobacco Use   Smoking status: Never   Smokeless tobacco: Never  Vaping Use   Vaping Use: Never used  Substance and Sexual Activity   Alcohol use: Not Currently    Comment: may have 2-3 drinks 2x/month   Drug use: No   Sexual activity: Yes    Birth control/protection: None  Other Topics Concern   Not on file  Social History Narrative   Patient is married and has two children ages 254yo and 40mo. 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 She reports having a supportive marriage, supportive family relationships, and has a few close friends.    Social Determinants of Health   Financial Resource Strain: Not on file  Food Insecurity: Not on file  Transportation Needs: Not on file  Physical Activity: Not on file  Stress: Not on file  Social Connections: Socially Integrated (02/05/2020)   Social Connection and Isolation Panel [NHANES]    Frequency of Communication with Friends and Family: More than three times a week    Frequency of Social Gatherings with Friends and Family: Three times a week    Attends Religious Services: More than 4 times per year    Active Member of Clubs or Organizations: Yes    Attends BankerClub or Organization Meetings: More  than 4 times per year    Marital Status: Married  Catering managerntimate Partner Violence: Not on file    Family History  Problem Relation Age of Onset   Hypertension Father    Asthma Mother    Diabetes Maternal Aunt    Diabetes Maternal Grandmother    Kidney disease Neg Hx    Bladder Cancer Neg Hx    Breast cancer Neg Hx     Allergies  Allergen Reactions   Azithromycin Nausea And Vomiting, Diarrhea and Other (See Comments)   Adalat Cc [Nifedipine Er]    Amoxicillin-Pot Clavulanate Nausea And Vomiting and Nausea Only    vomiting    Review of Systems  Constitutional: Negative.   HENT: Negative.    Eyes: Negative.   Respiratory: Negative.    Cardiovascular: Negative.   Gastrointestinal: Negative.   Genitourinary: Negative.    Musculoskeletal: Negative.   Skin: Negative.   Neurological: Negative.   Endo/Heme/Allergies: Negative.   Psychiatric/Behavioral:  The patient has insomnia.        Objective:   BP (!) 148/112   Pulse 98   Ht 5\' 5"  (1.651 m)   Wt 189 lb 12.8 oz (86.1 kg)   SpO2 98%   BMI 31.58 kg/m   Vitals:   01/15/23 1424  BP: (!) 148/112  Pulse: 98  Height: 5\' 5"  (1.651 m)  Weight: 189 lb 12.8 oz (86.1 kg)  SpO2: 98%  BMI (Calculated): 31.58    Physical Exam Vitals reviewed.  Constitutional:      Appearance: Normal appearance.  HENT:     Head: Normocephalic.     Nose: Nose normal.     Mouth/Throat:     Mouth: Mucous membranes are moist.  Eyes:     Pupils: Pupils are equal, round, and reactive to light.  Cardiovascular:     Rate and Rhythm: Normal rate and regular rhythm.  Pulmonary:     Effort: Pulmonary effort is normal.     Breath sounds: Normal breath sounds.  Abdominal:     General: Bowel sounds are normal.     Palpations: Abdomen is soft.  Musculoskeletal:        General: Normal range of motion.     Cervical back: Normal range of motion and neck supple.  Skin:    General: Skin is warm and dry.  Neurological:     Mental Status: She is alert and oriented to person, place, and time.  Psychiatric:        Mood and Affect: Mood normal.        Behavior: Behavior normal.      No results found for any visits on 01/15/23.  Recent Results (from the past 2160 hour(s))  Cytology - PAP     Status: None   Collection Time: 11/06/22  3:24 PM  Result Value Ref Range   High risk HPV Negative    Adequacy      Satisfactory for evaluation; transformation zone component PRESENT.   Diagnosis      - Negative for intraepithelial lesion or malignancy (NILM)   Comment Normal Reference Range HPV - Negative   Hemoglobin A1c     Status: None   Collection Time: 12/08/22 11:17 AM  Result Value Ref Range   Hgb A1c MFr Bld 5.0 4.8 - 5.6 %    Comment:          Prediabetes: 5.7 -  6.4          Diabetes: >6.4  Glycemic control for adults with diabetes: <7.0    Est. average glucose Bld gHb Est-mCnc 97 mg/dL  TSH     Status: None   Collection Time: 12/08/22 11:17 AM  Result Value Ref Range   TSH 1.550 0.450 - 4.500 uIU/mL  CMP14+EGFR     Status: Abnormal   Collection Time: 12/08/22 11:17 AM  Result Value Ref Range   Glucose 88 70 - 99 mg/dL   BUN 15 6 - 20 mg/dL   Creatinine, Ser 0.23 0.57 - 1.00 mg/dL   eGFR 343 >56 YS/HUO/3.72   BUN/Creatinine Ratio 21 9 - 23   Sodium 140 134 - 144 mmol/L   Potassium 4.3 3.5 - 5.2 mmol/L   Chloride 105 96 - 106 mmol/L   CO2 21 20 - 29 mmol/L   Calcium 9.6 8.7 - 10.2 mg/dL   Total Protein 6.7 6.0 - 8.5 g/dL   Albumin 4.7 3.9 - 4.9 g/dL   Globulin, Total 2.0 1.5 - 4.5 g/dL   Albumin/Globulin Ratio 2.4 (H) 1.2 - 2.2   Bilirubin Total 0.5 0.0 - 1.2 mg/dL   Alkaline Phosphatase 87 44 - 121 IU/L   AST 23 0 - 40 IU/L   ALT 43 (H) 0 - 32 IU/L  Lipid panel     Status: Abnormal   Collection Time: 12/08/22 11:17 AM  Result Value Ref Range   Cholesterol, Total 168 100 - 199 mg/dL   Triglycerides 902 0 - 149 mg/dL   HDL 45 >11 mg/dL   VLDL Cholesterol Cal 21 5 - 40 mg/dL   LDL Chol Calc (NIH) 155 (H) 0 - 99 mg/dL   Chol/HDL Ratio 3.7 0.0 - 4.4 ratio    Comment:                                   T. Chol/HDL Ratio                                             Men  Women                               1/2 Avg.Risk  3.4    3.3                                   Avg.Risk  5.0    4.4                                2X Avg.Risk  9.6    7.1                                3X Avg.Risk 23.4   11.0   POCT Urinalysis Dipstick (20802)     Status: Abnormal   Collection Time: 12/11/22  9:26 AM  Result Value Ref Range   Color, UA Yellow    Clarity, UA Slightly Cloudy    Glucose, UA Negative Negative   Bilirubin, UA Negative    Ketones, UA Negative    Spec Grav, UA >=1.030 (A) 1.010 - 1.025  Blood, UA Negative    pH, UA 5.5 5.0 -  8.0   Protein, UA Negative Negative   Urobilinogen, UA 0.2 0.2 or 1.0 E.U./dL   Nitrite, UA Negative    Leukocytes, UA Moderate (2+) (A) Negative   Appearance Yellow    Odor    POCT urine pregnancy     Status: Normal   Collection Time: 12/11/22  9:39 AM  Result Value Ref Range   Preg Test, Ur Negative Negative  POCT Urinalysis Dipstick (16109)     Status: Normal   Collection Time: 12/28/22 10:06 AM  Result Value Ref Range   Color, UA     Clarity, UA     Glucose, UA Negative Negative   Bilirubin, UA Negative    Ketones, UA Negative    Spec Grav, UA 1.020 1.010 - 1.025   Blood, UA Negative    pH, UA 7.0 5.0 - 8.0   Protein, UA Negative Negative   Urobilinogen, UA 0.2 0.2 or 1.0 E.U./dL   Nitrite, UA Negative    Leukocytes, UA Negative Negative   Appearance Slighty Cloudy    Odor        Assessment & Plan:   Problem List Items Addressed This Visit   None   No follow-ups on file.   Total time spent: 35 minutes  Orson Eva, NP  01/15/2023

## 2023-02-15 ENCOUNTER — Encounter: Payer: Self-pay | Admitting: Nurse Practitioner

## 2023-02-15 ENCOUNTER — Ambulatory Visit: Payer: BC Managed Care – PPO | Admitting: Nurse Practitioner

## 2023-02-15 VITALS — BP 148/98 | HR 89 | Temp 97.2°F | Ht 65.0 in | Wt 190.0 lb

## 2023-02-15 DIAGNOSIS — J069 Acute upper respiratory infection, unspecified: Secondary | ICD-10-CM | POA: Insufficient documentation

## 2023-02-15 DIAGNOSIS — F411 Generalized anxiety disorder: Secondary | ICD-10-CM | POA: Diagnosis not present

## 2023-02-15 DIAGNOSIS — J3489 Other specified disorders of nose and nasal sinuses: Secondary | ICD-10-CM | POA: Diagnosis not present

## 2023-02-15 DIAGNOSIS — I10 Essential (primary) hypertension: Secondary | ICD-10-CM | POA: Diagnosis not present

## 2023-02-15 MED ORDER — LEVOFLOXACIN 500 MG PO TABS: 500.0000 mg | ORAL_TABLET | Freq: Every day | ORAL | 0 refills | Status: AC

## 2023-02-15 NOTE — Patient Instructions (Signed)
1) Follow up appt in 1 week 2) Push fluids, Delsym before bedtime 3) Antibiotic

## 2023-02-15 NOTE — Progress Notes (Signed)
Established Patient Office Visit  Subjective:  Patient ID: Sonya Bauer, female    DOB: 01/30/1991  Age: 32 y.o. MRN: [id number]  Chief Complaint  Patient presents with   Acute Visit    Cough and chest congestion x 1 week.    Cough/congestion x 1 week, not coughing up sputum.  Chest feels heavy, winded just having a conversation.    No other concerns at this time.   Past Medical History:  Diagnosis Date   Atypical mole 2017   back, dysplastic nevus, exc   Complication of anesthesia    Depression    Family history of adverse reaction to anesthesia    MOM-HAD SEIZURE DURING SURGERY    GERD (gastroesophageal reflux disease)    EVERY OTHER DAY   Gestational diabetes    Gluten intolerance 07/31/2019   Headache    Heart murmur    ASYMPTOMATIC   Heartburn    Hiatal hernia    History of kidney stones    Hypertension    Kidney stones    Motion sickness    long trips   Pregnancy induced hypertension    Thyroid cyst 11/16/2014    Past Surgical History:  Procedure Laterality Date   CYSTOSCOPY WITH STENT PLACEMENT Left 10/06/2016   Procedure: CYSTOSCOPY WITH STENT PLACEMENT;  Surgeon: Sebastian Ache, MD;  Location: [facility];  Service: Urology;  Laterality: Left;   TONSILLECTOMY AND ADENOIDECTOMY N/A 05/20/2018   Procedure: TONSILLECTOMY;  Surgeon: Linus Salmons, MD;  Location: [facility];  Service: ENT;  Laterality: N/A;   URETEROSCOPY WITH HOLMIUM LASER LITHOTRIPSY Left 10/06/2016   Procedure: URETEROSCOPY WITH HOLMIUM LASER LITHOTRIPSY;  Surgeon: Sebastian Ache, MD;  Location: [facility];  Service: Urology;  Laterality: Left;   WISDOM TOOTH EXTRACTION     AGE 35    Social History   Socioeconomic History   Marital status: Married    Spouse name: Not on file   Number of children: 2   Years of education: Not on file   Highest education level: Bachelor's degree (e.g., BA, AB, BS)  Occupational History   Occupation: unemployed   Tobacco Use   Smoking  status: Never   Smokeless tobacco: Never  Vaping Use   Vaping Use: Never used  Substance and Sexual Activity   Alcohol use: Not Currently    Comment: may have 2-3 drinks 2x/month   Drug use: No   Sexual activity: Yes    Birth control/protection: None  Other Topics Concern   Not on file  Social History Narrative   Patient is married and has two children ages 60yo and 47mo. 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 She reports having a supportive marriage, supportive family relationships, and has a few close friends.    Social Determinants of Health   Financial Resource Strain: Not on file  Food Insecurity: Not on file  Transportation Needs: Not on file  Physical Activity: Not on file  Stress: Not on file  Social Connections: Socially Integrated (02/05/2020)   Social Connection and Isolation Panel [NHANES]    Frequency of Communication with Friends and Family: More than three times a week    Frequency of Social Gatherings with Friends and Family: Three times a week    Attends Religious Services: More than 4 times per year    Active Member of Clubs or Organizations: Yes    Attends Banker Meetings: More than 4 times per year    Marital Status: Married  Intimate  Partner Violence: Not on file    Family History  Problem Relation Age of Onset   Hypertension Father    Asthma Mother    Diabetes Maternal Aunt    Diabetes Maternal Grandmother    Kidney disease Neg Hx    Bladder Cancer Neg Hx    Breast cancer Neg Hx     Allergies  Allergen Reactions   Azithromycin Nausea And Vomiting, Diarrhea and Other (See Comments)   Adalat Cc [Nifedipine Er]    Amoxicillin-Pot Clavulanate Nausea And Vomiting and Nausea Only    vomiting    Review of Systems  Constitutional:  Positive for malaise/fatigue.  HENT:  Positive for ear pain and sore throat.   Eyes: Negative.   Respiratory:  Positive for cough, sputum production and shortness of breath.   Cardiovascular: Negative.    Gastrointestinal: Negative.   Genitourinary: Negative.   Musculoskeletal: Negative.   Skin: Negative.   Neurological:  Positive for headaches.  Endo/Heme/Allergies: Negative.   Psychiatric/Behavioral: Negative.         Objective:   BP (!) 148/98   Pulse 89   Temp (!) 97.2 F (36.2 C) (Tympanic)   Ht 5\' 5"  (1.651 m)   Wt 190 lb (86.2 kg)   SpO2 99%   BMI 31.62 kg/m   Vitals:   02/15/23 1509  BP: (!) 148/98  Pulse: 89  Temp: (!) 97.2 F (36.2 C)  Height: 5\' 5"  (1.651 m)  Weight: 190 lb (86.2 kg)  SpO2: 99%  TempSrc: Tympanic  BMI (Calculated): 31.62    Physical Exam Vitals reviewed.  Constitutional:      Appearance: Normal appearance.  HENT:     Head: Normocephalic.     Nose: Nose normal.     Mouth/Throat:     Mouth: Mucous membranes are dry.  Eyes:     Pupils: Pupils are equal, round, and reactive to light.  Cardiovascular:     Rate and Rhythm: Normal rate and regular rhythm.  Pulmonary:     Effort: Pulmonary effort is normal.     Breath sounds: Normal breath sounds.  Abdominal:     General: Bowel sounds are normal.     Palpations: Abdomen is soft.  Musculoskeletal:        General: Normal range of motion.     Cervical back: Normal range of motion and neck supple.  Skin:    General: Skin is warm and dry.  Neurological:     Mental Status: She is alert and oriented to person, place, and time.  Psychiatric:        Mood and Affect: Mood normal.        Behavior: Behavior normal.      No results found for any visits on 02/15/23.  Recent Results (from the past 2160 hour(s))  Hemoglobin A1c     Status: None   Collection Time: 12/08/22 11:17 AM  Result Value Ref Range   Hgb A1c MFr Bld 5.0 4.8 - 5.6 %    Comment:          Prediabetes: 5.7 - 6.4          Diabetes: >6.4          Glycemic control for adults with diabetes: <7.0    Est. average glucose Bld gHb Est-mCnc 97 mg/dL  TSH     Status: None   Collection Time: 12/08/22 11:17 AM  Result  Value Ref Range   TSH 1.550 0.450 - 4.500 uIU/mL  CMP14+EGFR  Status: Abnormal   Collection Time: 12/08/22 11:17 AM  Result Value Ref Range   Glucose 88 70 - 99 mg/dL   BUN 15 6 - 20 mg/dL   Creatinine, Ser 1.61 0.57 - 1.00 mg/dL   eGFR 096 >04 VW/UJW/1.19   BUN/Creatinine Ratio 21 9 - 23   Sodium 140 134 - 144 mmol/L   Potassium 4.3 3.5 - 5.2 mmol/L   Chloride 105 96 - 106 mmol/L   CO2 21 20 - 29 mmol/L   Calcium 9.6 8.7 - 10.2 mg/dL   Total Protein 6.7 6.0 - 8.5 g/dL   Albumin 4.7 3.9 - 4.9 g/dL   Globulin, Total 2.0 1.5 - 4.5 g/dL   Albumin/Globulin Ratio 2.4 (H) 1.2 - 2.2   Bilirubin Total 0.5 0.0 - 1.2 mg/dL   Alkaline Phosphatase 87 44 - 121 IU/L   AST 23 0 - 40 IU/L   ALT 43 (H) 0 - 32 IU/L  Lipid panel     Status: Abnormal   Collection Time: 12/08/22 11:17 AM  Result Value Ref Range   Cholesterol, Total 168 100 - 199 mg/dL   Triglycerides 147 0 - 149 mg/dL   HDL 45 >82 mg/dL   VLDL Cholesterol Cal 21 5 - 40 mg/dL   LDL Chol Calc (NIH) 956 (H) 0 - 99 mg/dL   Chol/HDL Ratio 3.7 0.0 - 4.4 ratio    Comment:                                   T. Chol/HDL Ratio                                             Men  Women                               1/2 Avg.Risk  3.4    3.3                                   Avg.Risk  5.0    4.4                                2X Avg.Risk  9.6    7.1                                3X Avg.Risk 23.4   11.0   POCT Urinalysis Dipstick (21308)     Status: Abnormal   Collection Time: 12/11/22  9:26 AM  Result Value Ref Range   Color, UA Yellow    Clarity, UA Slightly Cloudy    Glucose, UA Negative Negative   Bilirubin, UA Negative    Ketones, UA Negative    Spec Grav, UA >=1.030 (A) 1.010 - 1.025   Blood, UA Negative    pH, UA 5.5 5.0 - 8.0   Protein, UA Negative Negative   Urobilinogen, UA 0.2 0.2 or 1.0 E.U./dL   Nitrite, UA Negative    Leukocytes, UA Moderate (2+) (A) Negative   Appearance Yellow    Odor  POCT urine pregnancy      Status: Normal   Collection Time: 12/11/22  9:39 AM  Result Value Ref Range   Preg Test, Ur Negative Negative  POCT Urinalysis Dipstick (62130)     Status: Normal   Collection Time: 12/28/22 10:06 AM  Result Value Ref Range   Color, UA     Clarity, UA     Glucose, UA Negative Negative   Bilirubin, UA Negative    Ketones, UA Negative    Spec Grav, UA 1.020 1.010 - 1.025   Blood, UA Negative    pH, UA 7.0 5.0 - 8.0   Protein, UA Negative Negative   Urobilinogen, UA 0.2 0.2 or 1.0 E.U./dL   Nitrite, UA Negative    Leukocytes, UA Negative Negative   Appearance Slighty Cloudy    Odor        Assessment & Plan:   Problem List Items Addressed This Visit   None   No follow-ups on file.   Total time spent: 35 minutes  Orson Eva, NP  02/15/2023

## 2023-02-22 ENCOUNTER — Encounter: Payer: Self-pay | Admitting: Nurse Practitioner

## 2023-02-23 ENCOUNTER — Ambulatory Visit: Payer: BC Managed Care – PPO | Admitting: Dermatology

## 2023-02-23 ENCOUNTER — Ambulatory Visit: Payer: BC Managed Care – PPO | Admitting: Nurse Practitioner

## 2023-02-23 ENCOUNTER — Encounter: Payer: Self-pay | Admitting: Dermatology

## 2023-02-23 VITALS — BP 130/90 | HR 67

## 2023-02-23 DIAGNOSIS — L814 Other melanin hyperpigmentation: Secondary | ICD-10-CM | POA: Diagnosis not present

## 2023-02-23 DIAGNOSIS — L578 Other skin changes due to chronic exposure to nonionizing radiation: Secondary | ICD-10-CM

## 2023-02-23 DIAGNOSIS — Z1283 Encounter for screening for malignant neoplasm of skin: Secondary | ICD-10-CM | POA: Diagnosis not present

## 2023-02-23 DIAGNOSIS — D225 Melanocytic nevi of trunk: Secondary | ICD-10-CM

## 2023-02-23 DIAGNOSIS — L858 Other specified epidermal thickening: Secondary | ICD-10-CM | POA: Diagnosis not present

## 2023-02-23 DIAGNOSIS — X32XXXA Exposure to sunlight, initial encounter: Secondary | ICD-10-CM

## 2023-02-23 DIAGNOSIS — L920 Granuloma annulare: Secondary | ICD-10-CM

## 2023-02-23 DIAGNOSIS — Z872 Personal history of diseases of the skin and subcutaneous tissue: Secondary | ICD-10-CM

## 2023-02-23 DIAGNOSIS — L821 Other seborrheic keratosis: Secondary | ICD-10-CM

## 2023-02-23 DIAGNOSIS — Z86018 Personal history of other benign neoplasm: Secondary | ICD-10-CM

## 2023-02-23 DIAGNOSIS — D229 Melanocytic nevi, unspecified: Secondary | ICD-10-CM

## 2023-02-23 DIAGNOSIS — W908XXA Exposure to other nonionizing radiation, initial encounter: Secondary | ICD-10-CM

## 2023-02-23 NOTE — Patient Instructions (Addendum)
Start Clobetasol ointment twice daily to affected finger as needed. Call if refills needed. Avoid applying to face, groin, and axilla. Use as directed. Long-term use can cause thinning of the skin.  Topical steroids (such as triamcinolone, fluocinolone, fluocinonide, mometasone, clobetasol, halobetasol, betamethasone, hydrocortisone) can cause thinning and lightening of the skin if they are used for too long in the same area. Your physician has selected the right strength medicine for your problem and area affected on the body. Please use your medication only as directed by your physician to prevent side effects.    For Keratosis Pilaris: Recommend starting moisturizer with exfoliant (Urea, Salicylic acid, or Lactic acid) one to two times daily to help smooth rough and bumpy skin.  OTC options include Cetaphil Rough and Bumpy lotion (Urea), Eucerin Roughness Relief lotion or spot treatment cream (Urea), CeraVe SA lotion/cream for Rough and Bumpy skin (Sal Acid), Gold Bond Rough and Bumpy cream (Sal Acid), and AmLactin 12% lotion/cream (Lactic Acid).  If applying in morning, also apply sunscreen to sun-exposed areas, since these exfoliating moisturizers can increase sensitivity to sun.    Recommend daily broad spectrum sunscreen SPF 30+ to sun-exposed areas, reapply every 2 hours as needed. Call for new or changing lesions.  Staying in the shade or wearing long sleeves, sun glasses (UVA+UVB protection) and wide brim hats (4-inch brim around the entire circumference of the hat) are also recommended for sun protection.    Melanoma ABCDEs  Melanoma is the most dangerous type of skin cancer, and is the leading cause of death from skin disease.  You are more likely to develop melanoma if you: Have light-colored skin, light-colored eyes, or red or blond hair Spend a lot of time in the sun Tan regularly, either outdoors or in a tanning bed Have had blistering sunburns, especially during childhood Have a  close family member who has had a melanoma Have atypical moles or large birthmarks  Early detection of melanoma is key since treatment is typically straightforward and cure rates are extremely high if we catch it early.   The first sign of melanoma is often a change in a mole or a new dark spot.  The ABCDE system is a way of remembering the signs of melanoma.  A for asymmetry:  The two halves do not match. B for border:  The edges of the growth are irregular. C for color:  A mixture of colors are present instead of an even brown color. D for diameter:  Melanomas are usually (but not always) greater than 6mm - the size of a pencil eraser. E for evolution:  The spot keeps changing in size, shape, and color.  Please check your skin once per month between visits. You can use a small mirror in front and a large mirror behind you to keep an eye on the back side or your body.   If you see any new or changing lesions before your next follow-up, please call to schedule a visit.  Please continue daily skin protection including broad spectrum sunscreen SPF 30+ to sun-exposed areas, reapplying every 2 hours as needed when you're outdoors.   Staying in the shade or wearing long sleeves, sun glasses (UVA+UVB protection) and wide brim hats (4-inch brim around the entire circumference of the hat) are also recommended for sun protection.     For eye puffiness:  The Ordinary Caffeine serum.    CeraVe Eye Repair      Due to recent changes in healthcare laws, you may  see results of your pathology and/or laboratory studies on MyChart before the doctors have had a chance to review them. We understand that in some cases there may be results that are confusing or concerning to you. Please understand that not all results are received at the same time and often the doctors may need to interpret multiple results in order to provide you with the best plan of care or course of treatment. Therefore, we ask that  you please give [place] 2 business days to thoroughly review all your results before contacting the office for clarification. Should we see a critical lab result, you will be contacted sooner.   If You Need Anything After Your Visit  If you have any questions or concerns for your doctor, please call our main line at [phone] and press option 4 to reach your doctor's medical assistant. If no one answers, please leave a voicemail as directed and we will return your call as soon as possible. Messages left after 4 pm will be answered the following business day.   You may also send [place] a message via MyChart. We typically respond to MyChart messages within 1-2 business days.  For prescription refills, please ask your pharmacy to contact our office. Our fax number is [phone].  If you have an urgent issue when the clinic is closed that cannot wait until the next business day, you can page your doctor at the number below.    Please note that while we do our best to be available for urgent issues outside of office hours, we are not available 24/7.   If you have an urgent issue and are unable to reach [place], you may choose to seek medical care at your doctor's office, retail clinic, urgent care center, or emergency room.  If you have a medical emergency, please immediately call 911 or go to the emergency department.  Pager Numbers  - Dr. Gwen Pounds: [phone]  - Dr. Neale Burly: [phone]  - Dr. Roseanne Reno: [phone]  In the event of inclement weather, please call our main line at [phone] for an update on the status of any delays or closures.  Dermatology Medication Tips: Please keep the boxes that topical medications come in in order to help keep track of the instructions about where and how to use these. Pharmacies typically print the medication instructions only on the boxes and not directly on the medication tubes.   If your medication is too expensive, please contact our office at  [phone] option 4 or send [place] a message through MyChart.   We are unable to tell what your co-pay for medications will be in advance as this is different depending on your insurance coverage. However, we may be able to find a substitute medication at lower cost or fill out paperwork to get insurance to cover a needed medication.   If a prior authorization is required to get your medication covered by your insurance company, please allow [place] 1-2 business days to complete this process.  Drug prices often vary depending on where the prescription is filled and some pharmacies may offer cheaper prices.  The website www.goodrx.com contains coupons for medications through different pharmacies. The prices here do not account for what the cost may be with help from insurance (it may be cheaper with your insurance), but the website can give you the price if you did not use any insurance.  - You can print the associated coupon and take it with your prescription to the pharmacy.  - You  may also stop by our office during regular business hours and pick up a GoodRx coupon card.  - If you need your prescription sent electronically to a different pharmacy, notify our office through [facility] or by phone at [phone] option 4.     Si Usted Necesita Algo Despus de Su Visita  Tambin puede enviarnos un mensaje a travs de Clinical cytogeneticist. Por lo general respondemos a los mensajes de MyChart en el transcurso de 1 a 2 das hbiles.  Para renovar recetas, por favor pida a su farmacia que se ponga en contacto con nuestra oficina. Annie Sable de fax es [place] [phone].  Si tiene un asunto urgente cuando la clnica est cerrada y que no puede esperar hasta el siguiente da hbil, puede llamar/localizar a su doctor(a) al nmero que aparece a continuacin.   Por favor, tenga en cuenta que aunque hacemos todo lo posible para estar disponibles para asuntos urgentes fuera del horario de [place], no estamos  disponibles las 24 horas del da, los 7 [address]  [address] de la [place].   Si tiene un problema urgente y no puede comunicarse con nosotros, puede optar por buscar atencin mdica  en el consultorio de su doctor(a), en una clnica privada, en un centro de atencin urgente o en una sala de emergencias.  Si tiene Engineer, drilling, por favor llame inmediatamente al 911 o vaya a la sala de emergencias.  Nmeros de bper  - Dr. Gwen Pounds: [phone]  - Dra. Moye: [phone]  - Dra. Roseanne Reno: [phone]  En caso de inclemencias del [place], por favor llame a Lacy Duverney principal al [phone] para una actualizacin sobre el [place] de cualquier retraso o cierre.  Consejos para la medicacin en dermatologa: Por favor, guarde las cajas en las que vienen los medicamentos de uso tpico para ayudarle a seguir las instrucciones sobre dnde y cmo usarlos. Las farmacias generalmente imprimen las instrucciones del medicamento slo en las cajas y no directamente en los tubos del [place].   Si su medicamento es muy caro, por favor, pngase en contacto con Rolm Gala llamando al [phone] y presione la opcin 4 o envenos un mensaje a travs de Clinical cytogeneticist.   No podemos decirle cul ser su copago por los medicamentos por adelantado ya que esto es diferente dependiendo de la cobertura de su seguro. Sin embargo, es posible que podamos encontrar un medicamento sustituto a Audiological scientist un formulario para que el seguro cubra el medicamento que se considera necesario.   Si se requiere una autorizacin previa para que su compaa de seguros [place] su medicamento, por favor permtanos de 1 a 2 das hbiles para completar [address]  Los precios de los medicamentos varan con frecuencia dependiendo del Environmental consultant de dnde se surte la receta y alguna farmacias pueden ofrecer precios ms baratos.  El sitio web www.goodrx.com tiene cupones para medicamentos de Health and safety inspector. Los precios aqu no  tienen en cuenta lo que podra costar con la ayuda del seguro (puede ser ms barato con su seguro), pero el sitio web puede darle el precio si no utiliz Tourist information centre manager.  - Puede imprimir el cupn correspondiente y llevarlo con su receta a la farmacia.  - Tambin puede pasar por nuestra oficina durante el horario de atencin regular y Education officer, museum una tarjeta de cupones de GoodRx.  - Si necesita que su receta se enve electrnicamente a Psychiatrist, informe a nuestra oficina a travs de MyChart de [place] o por telfono llamando al [phone] y presione  la opcin 4.

## 2023-02-23 NOTE — Progress Notes (Signed)
Follow-Up Visit   Subjective  Sonya Bauer is a 32 y.o. female who presents for the following: Skin Cancer Screening and Full Body Skin Exam. HxDN. Hx of GA. ILK injections in the past. Has also used Clobetasol ointment in the past. B/L elbows and left index fingertip were affected. Has issues at R-4 fingertip. No hx of eczema. Not itchy.  The patient presents for Total-Body Skin Exam (TBSE) for skin cancer screening and mole check. The patient has spots, moles and lesions to be evaluated, some may be new or changing and the patient has concerns that these could be cancer.  Daughter with patient.   The following portions of the chart were reviewed this encounter and updated as appropriate: medications, allergies, medical history  Review of Systems:  No other skin or systemic complaints except as noted in HPI or Assessment and Plan.  Objective  Well appearing patient in no apparent distress; mood and affect are within normal limits.  A full examination was performed including scalp, head, eyes, ears, nose, lips, neck, chest, axillae, abdomen, back, buttocks, bilateral upper extremities, bilateral lower extremities, hands, feet, fingers, toes, fingernails, and toenails. All findings within normal limits unless otherwise noted below.   Relevant physical exam findings are noted in the Assessment and Plan.  Right 4th Finger Tip 8 mm indistinct pink flesh flat papule with mild scale    Assessment & Plan   HISTORY OF DYSPLASTIC NEVUS. Back. Excised 2017. No evidence of recurrence today Recommend regular full body skin exams Recommend daily broad spectrum sunscreen SPF 30+ to sun-exposed areas, reapply every 2 hours as needed.  Call if any new or changing lesions are noted between office visits   LENTIGINES, SEBORRHEIC KERATOSES, HEMANGIOMAS - Benign normal skin lesions - Benign-appearing - Call for any changes  MELANOCYTIC NEVI - Tan-brown and/or pink-flesh-colored symmetric  macules and papules, including flesh colored moles on scalp - Benign appearing on exam today - Observation - Call clinic for new or changing moles - Recommend daily use of broad spectrum spf 30+ sunscreen to sun-exposed areas.  - Check toenails when remove polish.   MELANOCYTIC NEVUS Exam: 2.5 mm medium dark brown macule at Left Abdomen  Treatment Plan: Benign appearing on exam today. Recommend observation. Call clinic for new or changing moles. Recommend daily use of broad spectrum spf 30+ sunscreen to sun-exposed areas.   ACTINIC DAMAGE - Chronic condition, secondary to cumulative UV/sun exposure - diffuse scaly erythematous macules with underlying dyspigmentation - Recommend daily broad spectrum sunscreen SPF 30+ to sun-exposed areas, reapply every 2 hours as needed.  - Staying in the shade or wearing long sleeves, sun glasses (UVA+UVB protection) and wide brim hats (4-inch brim around the entire circumference of the hat) are also recommended for sun protection.  - Call for new or changing lesions.   SKIN CANCER SCREENING PERFORMED TODAY.  KERATOSIS PILARIS - Tiny follicular keratotic papules - Benign. Genetic in nature. No cure. - Observe. - If desired, patient can use an emollient (moisturizer) containing ammonium lactate (AmLactin), urea or salicylic acid once a day to smooth the area  Recommend starting moisturizer with exfoliant (Urea, Salicylic acid, or Lactic acid) one to two times daily to help smooth rough and bumpy skin.  OTC options include Cetaphil Rough and Bumpy lotion (Urea), Eucerin Roughness Relief lotion or spot treatment cream (Urea), CeraVe SA lotion/cream for Rough and Bumpy skin (Sal Acid), Gold Bond Rough and Bumpy cream (Sal Acid), and AmLactin 12% lotion/cream (Lactic Acid).  If applying in morning, also apply sunscreen to sun-exposed areas, since these exfoliating moisturizers can increase sensitivity to sun.     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 Condition is bothersome/symptomatic for patient. Currently flared.   Start Clobetasol ointment twice daily to affected finger as needed. Call if refills needed. Avoid applying to face, groin, and axilla. Use as directed. Long-term use can cause thinning of the skin. RTC if not improving  Topical steroids (such as triamcinolone, fluocinolone, fluocinonide, mometasone, clobetasol, halobetasol, betamethasone, hydrocortisone) can cause thinning and lightening of the skin if they are used for too long in the same area. Your physician has selected the right strength medicine for your problem and area affected on the body. Please use your medication only as directed by your physician to prevent side effects.    Skin cancer screening  Actinic skin damage  Nevus  History of dysplastic nevus   Return in about 1 year (around 02/23/2024) for TBSE As Scheduled.  I, Lawson Radar, CMA, am acting as scribe for Willeen Niece, MD.   Documentation: I have reviewed the above documentation for accuracy and completeness, and I agree with the above.  Willeen Niece, MD

## 2023-03-05 ENCOUNTER — Other Ambulatory Visit: Payer: Self-pay | Admitting: Nurse Practitioner

## 2023-03-05 MED ORDER — ZEPBOUND 2.5 MG/0.5ML ~~LOC~~ SOAJ: 2.5000 mg | SUBCUTANEOUS | 2 refills | Status: DC

## 2023-03-08 ENCOUNTER — Encounter: Payer: Self-pay | Admitting: Certified Nurse Midwife

## 2023-03-11 DIAGNOSIS — R197 Diarrhea, unspecified: Secondary | ICD-10-CM | POA: Diagnosis not present

## 2023-03-11 DIAGNOSIS — R1013 Epigastric pain: Secondary | ICD-10-CM | POA: Diagnosis not present

## 2023-03-11 DIAGNOSIS — R109 Unspecified abdominal pain: Secondary | ICD-10-CM | POA: Diagnosis not present

## 2023-03-11 DIAGNOSIS — R1011 Right upper quadrant pain: Secondary | ICD-10-CM | POA: Diagnosis not present

## 2023-03-11 DIAGNOSIS — K760 Fatty (change of) liver, not elsewhere classified: Secondary | ICD-10-CM | POA: Diagnosis not present

## 2023-03-11 DIAGNOSIS — K219 Gastro-esophageal reflux disease without esophagitis: Secondary | ICD-10-CM | POA: Diagnosis not present

## 2023-03-11 DIAGNOSIS — R1114 Bilious vomiting: Secondary | ICD-10-CM | POA: Diagnosis not present

## 2023-03-11 DIAGNOSIS — R112 Nausea with vomiting, unspecified: Secondary | ICD-10-CM | POA: Diagnosis not present

## 2023-03-11 DIAGNOSIS — N83202 Unspecified ovarian cyst, left side: Secondary | ICD-10-CM | POA: Diagnosis not present

## 2023-03-16 ENCOUNTER — Ambulatory Visit: Payer: BC Managed Care – PPO | Admitting: Nurse Practitioner

## 2023-03-18 ENCOUNTER — Other Ambulatory Visit: Payer: BC Managed Care – PPO

## 2023-03-18 DIAGNOSIS — R5383 Other fatigue: Secondary | ICD-10-CM

## 2023-03-18 DIAGNOSIS — I10 Essential (primary) hypertension: Secondary | ICD-10-CM

## 2023-03-18 DIAGNOSIS — E663 Overweight: Secondary | ICD-10-CM | POA: Diagnosis not present

## 2023-03-18 DIAGNOSIS — E785 Hyperlipidemia, unspecified: Secondary | ICD-10-CM | POA: Diagnosis not present

## 2023-03-18 DIAGNOSIS — K900 Celiac disease: Secondary | ICD-10-CM | POA: Diagnosis not present

## 2023-03-19 LAB — CMP14+EGFR
ALT: 44 IU/L — ABNORMAL HIGH (ref 0–32)
AST: 28 IU/L (ref 0–40)
Albumin/Globulin Ratio: 2.4 — ABNORMAL HIGH (ref 1.2–2.2)
Albumin: 4.6 g/dL (ref 3.9–4.9)
Alkaline Phosphatase: 83 IU/L (ref 44–121)
BUN/Creatinine Ratio: 22 (ref 9–23)
BUN: 15 mg/dL (ref 6–20)
Bilirubin Total: 0.7 mg/dL (ref 0.0–1.2)
CO2: 21 mmol/L (ref 20–29)
Calcium: 9.5 mg/dL (ref 8.7–10.2)
Chloride: 103 mmol/L (ref 96–106)
Creatinine, Ser: 0.69 mg/dL (ref 0.57–1.00)
Globulin, Total: 1.9 g/dL (ref 1.5–4.5)
Glucose: 85 mg/dL (ref 70–99)
Potassium: 4.1 mmol/L (ref 3.5–5.2)
Sodium: 139 mmol/L (ref 134–144)
Total Protein: 6.5 g/dL (ref 6.0–8.5)
eGFR: 119 mL/min/{1.73_m2} (ref >59)

## 2023-03-19 LAB — CBC WITH DIFFERENTIAL
Basophils Absolute: 0.1 10*3/uL (ref 0.0–0.2)
Basos: 1 %
EOS (ABSOLUTE): 1.1 10*3/uL — ABNORMAL HIGH (ref 0.0–0.4)
Eos: 15 %
Hematocrit: 39.3 % (ref 34.0–46.6)
Hemoglobin: 13.1 g/dL (ref 11.1–15.9)
Immature Grans (Abs): 0 10*3/uL (ref 0.0–0.1)
Immature Granulocytes: 0 %
Lymphocytes Absolute: 1.9 10*3/uL (ref 0.7–3.1)
Lymphs: 25 %
MCH: 29 pg (ref 26.6–33.0)
MCHC: 33.3 g/dL (ref 31.5–35.7)
MCV: 87 fL (ref 79–97)
Monocytes Absolute: 0.4 10*3/uL (ref 0.1–0.9)
Monocytes: 6 %
Neutrophils Absolute: 3.8 10*3/uL (ref 1.4–7.0)
Neutrophils: 53 %
RBC: 4.52 x10E6/uL (ref 3.77–5.28)
RDW: 12.6 % (ref 11.7–15.4)
WBC: 7.3 10*3/uL (ref 3.4–10.8)

## 2023-03-19 LAB — LIPID PANEL
Chol/HDL Ratio: 4 ratio (ref 0.0–4.4)
Cholesterol, Total: 167 mg/dL (ref 100–199)
HDL: 42 mg/dL (ref >39)
LDL Chol Calc (NIH): 101 mg/dL — ABNORMAL HIGH (ref 0–99)
Triglycerides: 134 mg/dL (ref 0–149)
VLDL Cholesterol Cal: 24 mg/dL (ref 5–40)

## 2023-03-19 LAB — HEMOGLOBIN A1C
Est. average glucose Bld gHb Est-mCnc: 91 mg/dL
Hgb A1c MFr Bld: 4.8 % (ref 4.8–5.6)

## 2023-03-19 LAB — TSH: TSH: 2.3 u[IU]/mL (ref 0.450–4.500)

## 2023-03-25 ENCOUNTER — Ambulatory Visit: Payer: BC Managed Care – PPO | Admitting: Nurse Practitioner

## 2023-03-25 ENCOUNTER — Ambulatory Visit (INDEPENDENT_AMBULATORY_CARE_PROVIDER_SITE_OTHER): Payer: BC Managed Care – PPO | Admitting: Family

## 2023-03-25 DIAGNOSIS — R350 Frequency of micturition: Secondary | ICD-10-CM

## 2023-03-25 LAB — POCT URINALYSIS DIPSTICK
Bilirubin, UA: NEGATIVE
Blood, UA: NEGATIVE
Glucose, UA: NEGATIVE
Ketones, UA: NEGATIVE
Nitrite, UA: NEGATIVE
Protein, UA: NEGATIVE
Spec Grav, UA: 1.025 (ref 1.010–1.025)
Urobilinogen, UA: 0.2 E.U./dL
pH, UA: 6.5 (ref 5.0–8.0)

## 2023-03-26 DIAGNOSIS — R350 Frequency of micturition: Secondary | ICD-10-CM | POA: Diagnosis not present

## 2023-03-26 NOTE — Progress Notes (Signed)
   Subjective   CHIEF COMPLAINT  Possible UTI    REASON FOR VISIT  UA Only        Objective   Results for orders placed or performed in visit on 03/25/23  POCT Urinalysis Dipstick (81002)  Result Value Ref Range   Color, UA yellow    Clarity, UA clear    Glucose, UA Negative Negative   Bilirubin, UA neg    Ketones, UA neg    Spec Grav, UA 1.025 1.010 - 1.025   Blood, UA neg    pH, UA 6.5 5.0 - 8.0   Protein, UA Negative Negative   Urobilinogen, UA 0.2 0.2 or 1.0 E.U./dL   Nitrite, UA neg    Leukocytes, UA Trace (A) Negative   Appearance clear    Odor yes     Assessment & Plan   Problem List Items Addressed This Visit   None Visit Diagnoses     Urinary frequency    -  Primary   Sending UA/UC.  Pt says that she has bactrim available to take already.  Will let her know if this will cover or not.   Relevant Orders   POCT Urinalysis Dipstick (81002) (Completed)   Urinalysis, Routine w reflex microscopic   Urine Culture         Total time spent: 10 minutes  Miki Kins, FNP 03/25/2023

## 2023-03-27 LAB — URINALYSIS, ROUTINE W REFLEX MICROSCOPIC
Bilirubin, UA: NEGATIVE
Glucose, UA: NEGATIVE
Ketones, UA: NEGATIVE
Nitrite, UA: NEGATIVE
Protein,UA: NEGATIVE
RBC, UA: NEGATIVE
Specific Gravity, UA: 1.023 (ref 1.005–1.030)
Urobilinogen, Ur: 0.2 mg/dL (ref 0.2–1.0)
pH, UA: 6.5 (ref 5.0–7.5)

## 2023-03-27 LAB — MICROSCOPIC EXAMINATION
Casts: NONE SEEN /lpf
WBC, UA: NONE SEEN /hpf (ref 0–5)

## 2023-03-29 DIAGNOSIS — K900 Celiac disease: Secondary | ICD-10-CM | POA: Diagnosis not present

## 2023-03-29 DIAGNOSIS — Q796 Ehlers-Danlos syndrome, unspecified: Secondary | ICD-10-CM | POA: Diagnosis not present

## 2023-03-31 ENCOUNTER — Encounter: Payer: Self-pay | Admitting: Nurse Practitioner

## 2023-03-31 LAB — URINE CULTURE

## 2023-04-01 ENCOUNTER — Encounter: Payer: Self-pay | Admitting: Family

## 2023-04-01 ENCOUNTER — Telehealth: Payer: Self-pay

## 2023-04-01 MED ORDER — SULFAMETHOXAZOLE-TRIMETHOPRIM 800-160 MG PO TABS
1.0000 | ORAL_TABLET | Freq: Two times a day (BID) | ORAL | 0 refills | Status: DC
Start: 2023-04-01 — End: 2023-05-26

## 2023-04-01 NOTE — Telephone Encounter (Signed)
Patient called asking about her urine culture she states that  it came back in [place]  and now she is now having some pain in that area and wants to make sure she's taking the correct abx

## 2023-04-01 NOTE — Addendum Note (Signed)
Addended by: Grayling Congress on: 04/01/2023 11:36 AM   Modules accepted: Orders

## 2023-04-13 ENCOUNTER — Ambulatory Visit: Payer: BC Managed Care – PPO | Admitting: Nurse Practitioner

## 2023-04-16 DIAGNOSIS — I10 Essential (primary) hypertension: Secondary | ICD-10-CM | POA: Diagnosis not present

## 2023-04-16 DIAGNOSIS — K900 Celiac disease: Secondary | ICD-10-CM | POA: Diagnosis not present

## 2023-04-19 ENCOUNTER — Encounter: Payer: Self-pay | Admitting: Cardiovascular Disease

## 2023-04-19 ENCOUNTER — Ambulatory Visit (INDEPENDENT_AMBULATORY_CARE_PROVIDER_SITE_OTHER): Payer: BC Managed Care – PPO

## 2023-04-19 VITALS — BP 161/124 | Ht 65.0 in | Wt 188.5 lb

## 2023-04-19 DIAGNOSIS — R350 Frequency of micturition: Secondary | ICD-10-CM

## 2023-04-19 DIAGNOSIS — I10 Essential (primary) hypertension: Secondary | ICD-10-CM

## 2023-04-19 LAB — POCT URINALYSIS DIPSTICK
Bilirubin, UA: NEGATIVE
Blood, UA: NEGATIVE
Glucose, UA: NEGATIVE
Ketones, UA: NEGATIVE
Leukocytes, UA: NEGATIVE
Nitrite, UA: NEGATIVE
Protein, UA: POSITIVE — AB
Spec Grav, UA: 1.01 (ref 1.010–1.025)
Urobilinogen, UA: NEGATIVE E.U./dL — AB
pH, UA: 5 (ref 5.0–8.0)

## 2023-04-19 NOTE — Progress Notes (Signed)
    NURSE VISIT NOTE  Subjective:    Patient ID: Sonya Bauer, female    DOB: [birth year], 32 y.o.   MRN: [id number]       HPI  Patient is a 32 y.o. G40P1001 female who presents for urinary frequency and abdominal pain for a few days.   Patient does have a history of recurrent UTI.  She recently had a Uti (03/26/23) And has completed  medication feels like it never went completely went away, stated she is on blood pressure medication but has not taken medication for 2 days as it makes her has urinary frequency as well, in office today only protein in urine she stated she would wait for urine culture to come back as her stomach is still hurting from previous antibiotic. Advised to also make apt with PCP as she stated she has recurrent Utis and is unsure if BP medication is also causing her urinary frequency.  Objective:    BP (!) 161/124   Ht 5\' 5"  (1.651 m)   Wt 188 lb 8 oz (85.5 kg)   Breastfeeding No   BMI 31.37 kg/m    Lab Review  No results found for any visits on 04/19/23.  Assessment:   1. Urinary frequency      Plan:   Urine Culture Sent. Follow up if symptoms worsen or fail to improve as anticipated, and as needed.  Contact PCP due to BP medication.     Loney Laurence, CMA

## 2023-04-19 NOTE — Addendum Note (Signed)
Addended by: Loney Laurence on: 04/19/2023 01:04 PM   Modules accepted: Orders

## 2023-04-20 ENCOUNTER — Ambulatory Visit: Payer: BC Managed Care – PPO

## 2023-04-21 LAB — URINE CULTURE: Organism ID, Bacteria: NO GROWTH

## 2023-04-26 MED ORDER — LOSARTAN POTASSIUM 100 MG PO TABS: 100.0000 mg | ORAL_TABLET | Freq: Every day | ORAL | 3 refills | Status: AC

## 2023-04-26 MED ORDER — AMLODIPINE BESYLATE 10 MG PO TABS: 10.0000 mg | ORAL_TABLET | Freq: Every day | ORAL | 3 refills | Status: AC

## 2023-04-26 MED ORDER — SPIRONOLACTONE 25 MG PO TABS
25.0000 mg | ORAL_TABLET | Freq: Every day | ORAL | 3 refills | Status: AC
Start: 2023-04-26 — End: ?

## 2023-05-16 ENCOUNTER — Encounter: Payer: Self-pay | Admitting: Nurse Practitioner

## 2023-05-19 MED ORDER — ZEPBOUND 2.5 MG/0.5ML ~~LOC~~ SOAJ: 2.5000 mg | SUBCUTANEOUS | 2 refills | Status: DC

## 2023-05-26 ENCOUNTER — Telehealth: Payer: BC Managed Care – PPO | Admitting: Physician Assistant

## 2023-05-26 DIAGNOSIS — U071 COVID-19: Secondary | ICD-10-CM | POA: Diagnosis not present

## 2023-05-26 MED ORDER — NIRMATRELVIR/RITONAVIR (PAXLOVID)TABLET
3.0000 | ORAL_TABLET | Freq: Two times a day (BID) | ORAL | 0 refills | Status: AC
Start: 2023-05-26 — End: 2023-05-31

## 2023-05-26 MED ORDER — DOXYCYCLINE HYCLATE 100 MG PO TABS
100.0000 mg | ORAL_TABLET | Freq: Two times a day (BID) | ORAL | 0 refills | Status: DC
Start: 2023-05-26 — End: 2023-11-26

## 2023-05-26 NOTE — Patient Instructions (Addendum)
Sonya Bauer, thank you for joining [name], PA-C for today's virtual visit.  While this provider is not your primary care provider (PCP), if your PCP is located in our provider database this encounter information will be shared with them immediately following your visit.   A [facility] MyChart account gives you access to today's visit and all your visits, tests, and labs performed at [facility] " click here if you don't have a [facility] MyChart account or go to [email or web address]  Consent: (Patient) Sonya Bauer provided verbal consent for this virtual visit at the beginning of the encounter.  Current Medications:  Current Outpatient Medications:    amLODipine (NORVASC) 10 MG tablet, Take 1 tablet (10 mg total) by mouth daily., Disp: 180 tablet, Rfl: 3   buPROPion (WELLBUTRIN XL) 150 MG 24 hr tablet, Take 1 tablet (150 mg total) by mouth every morning., Disp: 90 tablet, Rfl: 1   cetirizine (ZYRTEC) 10 MG tablet, Take 10 mg by mouth daily as needed., Disp: , Rfl:    Cholecalciferol (VITAMIN D3 PO), Take by mouth., Disp: , Rfl:    losartan (COZAAR) 100 MG tablet, Take 1 tablet (100 mg total) by mouth daily., Disp: 90 tablet, Rfl: 3   spironolactone (ALDACTONE) 25 MG tablet, Take 1 tablet (25 mg total) by mouth daily., Disp: 90 tablet, Rfl: 3   tirzepatide (ZEPBOUND) 2.5 MG/0.5ML Pen, Inject 2.5 mg into the skin once a week., Disp: 2 mL, Rfl: 2   valACYclovir (VALTREX) 1000 MG tablet, Take 1 tablet (1,000 mg total) by mouth daily., Disp: 90 tablet, Rfl: 3   Medications ordered in this encounter:  No orders of the defined types were placed in this encounter.    *If you need refills on other medications prior to your next appointment, please contact your pharmacy*  Follow-Up: Call back or seek an in-person evaluation if the symptoms worsen or if the condition fails to improve as anticipated.  [facility] [phone]  Care  Instructions: Please keep well-hydrated and get plenty of rest. Start a saline nasal rinse to flush out your nasal passages. You can use plain Mucinex to help thin congestion. Take the Paxlovid as directed. If you have a humidifier, running in the bedroom at night. I want you to start OTC vitamin D3 1000 units daily, vitamin C 1000 mg daily, and a zinc supplement. Please take prescribed medications as directed.   Isolation Instructions: You are to isolate at home until you have been fever free for at least 24 hours without a fever-reducing medication, and symptoms have been steadily improving for 24 hours. At that time,  you can end isolation but need to mask for an additional 5 days.   If you must be around other household members who do not have symptoms, you need to make sure that both you and the family members are masking consistently with a high-quality mask.  If you note any worsening of symptoms despite treatment, please seek an in-person evaluation ASAP. If you note any significant shortness of breath or any chest pain, please seek ER evaluation. Please do not delay care!   COVID-19: What to Do if You Are Sick If you test positive and are an older adult or someone who is at high risk of getting very sick from COVID-19, treatment may be available. Contact a healthcare provider right away after a positive test to determine if you are eligible, even if your symptoms are mild right now. You  can also visit a Test to Treat location and, if eligible, receive a prescription from a provider. Don't delay: Treatment must be started within the first few days to be effective. If you have a fever, cough, or other symptoms, you might have COVID-19. Most people have mild illness and are able to recover at home. If you are sick: Keep track of your symptoms. If you have an emergency warning sign (including trouble breathing), call 911. Steps to help prevent the spread of COVID-19 if you are sick If  you are sick with COVID-19 or think you might have COVID-19, follow the steps below to care for yourself and to help protect other people in your home and community. Stay home except to get medical care Stay home. Most people with COVID-19 have mild illness and can recover at home without medical care. Do not leave your home, except to get medical care. Do not visit public areas and do not go to places where you are unable to wear a mask. Take care of yourself. Get rest and stay hydrated. Take over-the-counter medicines, such as acetaminophen, to help you feel better. Stay in touch with your doctor. Call before you get medical care. Be sure to get care if you have trouble breathing, or have any other emergency warning signs, or if you think it is an emergency. Avoid public transportation, ride-sharing, or taxis if possible. Get tested If you have symptoms of COVID-19, get tested. While waiting for test results, stay away from others, including staying apart from those living in your household. Get tested as soon as possible after your symptoms start. Treatments may be available for people with COVID-19 who are at risk for becoming very sick. Don't delay: Treatment must be started early to be effective--some treatments must begin within 5 days of your first symptoms. Contact your healthcare provider right away if your test result is positive to determine if you are eligible. Self-tests are one of several options for testing for the virus that causes COVID-19 and may be more convenient than laboratory-based tests and point-of-care tests. Ask your healthcare provider or your local health department if you need help interpreting your test results. You can visit your state, tribal, local, and territorial health department's website to look for the latest local information on testing sites. Separate yourself from other people As much as possible, stay in a specific room and away from other people and pets in  your home. If possible, you should use a separate bathroom. If you need to be around other people or animals in or outside of the home, wear a well-fitting mask. Tell your close contacts that they may have been exposed to COVID-19. An infected person can spread COVID-19 starting 48 hours (or 2 days) before the person has any symptoms or tests positive. By letting your close contacts know they may have been exposed to COVID-19, you are helping to protect everyone. See COVID-19 and Animals if you have questions about pets. If you are diagnosed with COVID-19, someone from the health department may call you. Answer the call to slow the spread. Monitor your symptoms Symptoms of COVID-19 include fever, cough, or other symptoms. Follow care instructions from your healthcare provider and local health department. Your local health authorities may give instructions on checking your symptoms and reporting information. When to seek emergency medical attention Look for emergency warning signs* for COVID-19. If someone is showing any of these signs, seek emergency medical care immediately: Trouble breathing Persistent pain or pressure  in the chest New confusion Inability to wake or stay awake Pale, gray, or blue-colored skin, lips, or nail beds, depending on skin tone *This list is not all possible symptoms. Please call your medical provider for any other symptoms that are severe or concerning to you. Call 911 or call ahead to your local emergency facility: Notify the operator that you are seeking care for someone who has or may have COVID-19. Call ahead before visiting your doctor Call ahead. Many medical visits for routine care are being postponed or done by phone or telemedicine. If you have a medical appointment that cannot be postponed, call your doctor's office, and tell them you have or may have COVID-19. This will help the office protect themselves and other patients. If you are sick, wear a  well-fitting mask You should wear a mask if you must be around other people or animals, including pets (even at home). Wear a mask with the best fit, protection, and comfort for you. You don't need to wear the mask if you are alone. If you can't put on a mask (because of trouble breathing, for example), cover your coughs and sneezes in some other way. Try to stay at least 6 feet away from other people. This will help protect the people around you. Masks should not be placed on young children under age 49 years, anyone who has trouble breathing, or anyone who is not able to remove the mask without help. Cover your coughs and sneezes Cover your mouth and nose with a tissue when you cough or sneeze. Throw away used tissues in a lined trash can. Immediately wash your hands with soap and water for at least 20 seconds. If soap and water are not available, clean your hands with an alcohol-based hand sanitizer that contains at least 60% alcohol. Clean your hands often Wash your hands often with soap and water for at least 20 seconds. This is especially important after blowing your nose, coughing, or sneezing; going to the bathroom; and before eating or preparing food. Use hand sanitizer if soap and water are not available. Use an alcohol-based hand sanitizer with at least 60% alcohol, covering all surfaces of your hands and rubbing them together until they feel dry. Soap and water are the best option, especially if hands are visibly dirty. Avoid touching your eyes, nose, and mouth with unwashed hands. Handwashing Tips Avoid sharing personal household items Do not share dishes, drinking glasses, cups, eating utensils, towels, or bedding with other people in your home. Wash these items thoroughly after using them with soap and water or put in the dishwasher. Clean surfaces in your home regularly Clean and disinfect high-touch surfaces (for example, doorknobs, tables, handles, light switches, and countertops)  in your "sick room" and bathroom. In shared spaces, you should clean and disinfect surfaces and items after each use by the person who is ill. If you are sick and cannot clean, a caregiver or other person should only clean and disinfect the area around you (such as your bedroom and bathroom) on an as needed basis. Your caregiver/other person should wait as long as possible (at least several hours) and wear a mask before entering, cleaning, and disinfecting shared spaces that you use. Clean and disinfect areas that may have blood, stool, or body fluids on them. Use household cleaners and disinfectants. Clean visible dirty surfaces with household cleaners containing soap or detergent. Then, use a household disinfectant. Use a product from [employer] List N: Disinfectants for Coronavirus (COVID-19). Be  sure to follow the instructions on the label to ensure safe and effective use of the product. Many products recommend keeping the surface wet with a disinfectant for a certain period of time (look at "contact time" on the product label). You may also need to wear personal protective equipment, such as gloves, depending on the directions on the product label. Immediately after disinfecting, wash your hands with soap and water for 20 seconds. For completed guidance on cleaning and disinfecting your home, visit Complete Disinfection Guidance. Take steps to improve ventilation at home Improve ventilation (air flow) at home to help prevent from spreading COVID-19 to other people in your household. Clear out COVID-19 virus particles in the air by opening windows, using air filters, and turning on fans in your home. Use this interactive tool to learn how to improve air flow in your home. When you can be around others after being sick with COVID-19 Deciding when you can be around others is different for different situations. Find out when you can safely end home isolation. For any additional questions about your care,  contact your healthcare provider or state or local health department. 12/31/2020 Content source: [facility] for Immunization and Respiratory Diseases ([facility]), Division of Viral Diseases This information is not intended to replace advice given to you by your health care provider. Make sure you discuss any questions you have with your health care provider. Document Revised: 02/13/2021 Document Reviewed: 02/13/2021 Elsevier Patient Education  2022 [employer].   If you have been instructed to have an in-person evaluation today at a local Urgent Care facility, please use the link below. It will take you to a list of all of our available [facility], including address, phone number and hours of operation. Please do not delay care.  [facility]  If you or a family member do not have a primary care provider, use the link below to schedule a visit and establish care. When you choose a [facility] primary care physician or advanced practice provider, you gain a long-term partner in health. Find a Primary Care Provider  Learn more about [facility]'s in-office and virtual care options: [facility] - Get Care Now

## 2023-05-26 NOTE — Progress Notes (Signed)
Virtual Visit Consent   Sonya Bauer, you are scheduled for a virtual visit with a [facility] provider today. Just as with appointments in the office, your consent must be obtained to participate. Your consent will be active for this visit and any virtual visit you may have with one of our providers in the next 365 days. If you have a MyChart account, a copy of this consent can be sent to you electronically.  As this is a virtual visit, video technology does not allow for your provider to perform a traditional examination. This may limit your provider's ability to fully assess your condition. If your provider identifies any concerns that need to be evaluated in person or the need to arrange testing (such as labs, EKG, etc.), we will make arrangements to do so. Although advances in technology are sophisticated, we cannot ensure that it will always work on either your end or our end. If the connection with a video visit is poor, the visit may have to be switched to a telephone visit. With either a video or telephone visit, we are not always able to ensure that we have a secure connection.  By engaging in this virtual visit, you consent to the provision of healthcare and authorize for your insurance to be billed (if applicable) for the services provided during this visit. Depending on your insurance coverage, you may receive a charge related to this service.  I need to obtain your verbal consent now. Are you willing to proceed with your visit today? Sonya Bauer has provided verbal consent on 05/26/2023 for a virtual visit (video or telephone). [name], [place]  Date: 05/26/2023 8:03 AM  Virtual Visit via Video Note   I, [name], connected with  Sonya Bauer  ([id number], 1991-02-22) on 05/26/23 at  8:00 AM EDT by a video-enabled telemedicine application and verified that I am speaking with the correct person using two identifiers.  Location: Patient: Virtual Visit  Location Patient: Home Provider: Virtual Visit Location Provider: Home Office   I discussed the limitations of evaluation and management by telemedicine and the availability of in person appointments. The patient expressed understanding and agreed to proceed.    History of Present Illness: Sonya Bauer is a 32 y.o. who identifies as a female who was assigned female at birth, and is being seen today for COVID-19. Endorses symptoms starting Sunday night with fever, aches, chills, nausea with episode of emesis. By yesterday was slightly better with resolution of body aches but now with sinus pressure, nasal congestion. Tested for COVID on Monday evening and was positive. Has used OTC nasal spray and decongestants to help with sinus pressure/sinus pain along with OTC Ibuprofen. Denies chest pain or SOB. Nausea has also resolved. Denies ear pain but notes ear fullness/pressure.   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 (Take nirmatrelvir 150 mg two tablets twice daily for 5  days and ritonavir 100 mg one tablet twice daily for 5 days) Patient GFR is 119, Disp: 30 tablet, Rfl: 0   amLODipine (NORVASC) 10 MG tablet, Take 1 tablet (10 mg total) by mouth daily., Disp: 180 tablet, Rfl: 3   buPROPion (WELLBUTRIN XL) 150 MG 24 hr tablet, Take 1 tablet (150 mg total) by mouth every morning., Disp: 90 tablet, Rfl: 1   cetirizine (ZYRTEC) 10 MG tablet, Take 10 mg by mouth daily as needed., Disp: , Rfl:    Cholecalciferol (VITAMIN D3 PO), Take by mouth., Disp: , Rfl:    losartan (COZAAR) 100 MG tablet, Take 1 tablet (100 mg total) by mouth daily., Disp: 90 tablet, Rfl: 3   spironolactone (ALDACTONE) 25 MG tablet, Take 1 tablet (25 mg total) by mouth daily., Disp: 90 tablet, Rfl: 3   tirzepatide (ZEPBOUND) 2.5 MG/0.5ML Pen, Inject 2.5 mg into the skin once a week., Disp: 2 mL, Rfl: 2   valACYclovir (VALTREX) 1000 MG tablet, Take 1 tablet (1,000 mg total) by mouth daily., Disp: 90 tablet, Rfl: 3  Observations/Objective: Patient is well-developed, well-nourished in no acute distress.  Resting comfortably at home.  Head is normocephalic, atraumatic.  No labored breathing. Speech is clear and coherent with logical content.  Patient is alert and oriented at baseline.   Assessment and Plan: 1. COVID-19 - nirmatrelvir/ritonavir (PAXLOVID) 20 x 150 MG & 10 x 100MG  TABS; Take 3 tablets by mouth 2 (two) times daily for 5 days. (Take nirmatrelvir 150 mg two tablets twice daily for 5 days and ritonavir 100 mg one tablet twice daily for 5 days) Patient GFR is 119  Dispense: 30 tablet; Refill: 0 - doxycycline (VIBRA-TABS) 100 MG tablet; Take 1 tablet (100 mg total) by mouth 2 (two) times daily.  Dispense: 20 tablet; Refill: 0 Discussed risks/benefits of antiviral medications including most common potential ADRs. Patient voiced understanding and would like to proceed with antiviral medication. They are candidate for Paxlovid. Rx sent to pharmacy. Supportive measures, OTC medications and  vitamin regimen reviewed. Flonase once daily. Doxycycline as directed for secondary sinusitis. Quarantine reviewed in detail. Strict ER precautions discussed with patient.    Follow Up Instructions: I discussed the assessment and treatment plan with the patient. The patient was provided an opportunity to ask questions and all were answered. The patient agreed with the plan and demonstrated an understanding of the instructions.  A copy of instructions were sent to the patient via MyChart unless otherwise noted below.   The patient was advised to call back or seek an in-person evaluation if the symptoms worsen or if the condition fails to improve as anticipated.  Time:  I spent 10 minutes with the patient via telehealth technology discussing the above problems/concerns.    [name], PA-C

## 2023-06-02 ENCOUNTER — Other Ambulatory Visit: Payer: Self-pay | Admitting: Family

## 2023-06-02 ENCOUNTER — Telehealth: Payer: Self-pay | Admitting: Nurse Practitioner

## 2023-06-02 NOTE — Telephone Encounter (Signed)
Patient left VM that she missed her Zepbound dose this past Saturday and needs her Rx sent in. States if she doesn't get Rx soon she will have to start all over and insurance will not cover it. Please advise. She is wanting the 5 mg by the way.

## 2023-06-03 ENCOUNTER — Other Ambulatory Visit: Payer: Self-pay | Admitting: Cardiology

## 2023-06-03 ENCOUNTER — Other Ambulatory Visit: Payer: Self-pay

## 2023-06-03 MED ORDER — ZEPBOUND 5 MG/0.5ML ~~LOC~~ SOAJ: 5.0000 mg | SUBCUTANEOUS | 0 refills | Status: DC

## 2023-07-09 ENCOUNTER — Encounter: Payer: Self-pay | Admitting: Cardiology

## 2023-07-09 ENCOUNTER — Ambulatory Visit: Payer: BC Managed Care – PPO | Attending: Cardiology | Admitting: Cardiology

## 2023-07-09 VITALS — BP 115/83 | HR 77 | Ht 65.0 in | Wt 168.4 lb

## 2023-07-09 DIAGNOSIS — E041 Nontoxic single thyroid nodule: Secondary | ICD-10-CM | POA: Diagnosis not present

## 2023-07-09 DIAGNOSIS — I10 Essential (primary) hypertension: Secondary | ICD-10-CM

## 2023-07-09 DIAGNOSIS — F411 Generalized anxiety disorder: Secondary | ICD-10-CM

## 2023-07-09 DIAGNOSIS — R0602 Shortness of breath: Secondary | ICD-10-CM

## 2023-07-09 DIAGNOSIS — Q796 Ehlers-Danlos syndrome, unspecified: Secondary | ICD-10-CM | POA: Diagnosis not present

## 2023-07-09 DIAGNOSIS — R5383 Other fatigue: Secondary | ICD-10-CM

## 2023-07-09 NOTE — Patient Instructions (Signed)
Medication Instructions:  Your physician recommends that you continue on your current medications as directed. Please refer to the Current Medication list given to you today.   *If you need a refill on your cardiac medications before your next appointment, please call your pharmacy*   Lab Work: No labs ordered today    Testing/Procedures: Your physician has requested that you have an echocardiogram. Echocardiography is a painless test that uses sound waves to create images of your heart. It provides your doctor with information about the size and shape of your heart and how well your heart's chambers and valves are working.   You may receive an ultrasound enhancing agent through an IV if needed to better visualize your heart during the echo. This procedure takes approximately one hour.  There are no restrictions for this procedure.  This will take place at [address] [facility]) [address], [place] [address]   Thyroid Ultrasound  Please go to [facility] [address] ([address]) [address], [place] [address]   Follow-Up: At [facility], you and your health needs are our priority.  As part of our continuing mission to provide you with exceptional heart care, we have created designated Provider Care Teams.  These Care Teams include your primary Cardiologist (physician) and Advanced Practice Providers (APPs -  Physician Assistants and Nurse Practitioners) who all work together to provide you with the care you need, when you need it.  We recommend signing up for the patient portal called "MyChart".  Sign up information is provided on this After Visit Summary.  MyChart is used to connect with patients for Virtual Visits (Telemedicine).  Patients are able to view lab/test results, encounter notes, upcoming appointments, etc.  Non-urgent messages can be sent to your provider as well.   To learn more about what you can do with MyChart, go to  [email or web address].    Your next appointment:   1 month(s)  Provider:   Charlsie Quest, NP

## 2023-07-13 ENCOUNTER — Ambulatory Visit: Payer: BC Managed Care – PPO

## 2023-08-04 ENCOUNTER — Ambulatory Visit: Payer: BC Managed Care – PPO

## 2023-08-17 ENCOUNTER — Ambulatory Visit: Payer: BC Managed Care – PPO | Admitting: Cardiology

## 2023-08-20 ENCOUNTER — Ambulatory Visit
Admission: EM | Admit: 2023-08-20 | Discharge: 2023-08-20 | Disposition: A | Discharge status: Home / Self Care | Payer: BC Managed Care – PPO | Attending: Emergency Medicine | Admitting: Emergency Medicine

## 2023-08-20 ENCOUNTER — Encounter: Payer: Self-pay | Admitting: Emergency Medicine

## 2023-08-20 ENCOUNTER — Other Ambulatory Visit: Payer: Self-pay

## 2023-08-20 DIAGNOSIS — J069 Acute upper respiratory infection, unspecified: Secondary | ICD-10-CM

## 2023-08-20 MED ORDER — PROMETHAZINE-DM 6.25-15 MG/5ML PO SYRP: 5.0000 mL | ORAL_SOLUTION | Freq: Four times a day (QID) | ORAL | 0 refills | Status: DC | PRN

## 2023-08-20 MED ORDER — BENZONATATE 100 MG PO CAPS: 100.0000 mg | ORAL_CAPSULE | Freq: Three times a day (TID) | ORAL | 0 refills | Status: DC

## 2023-08-20 MED ORDER — CEFDINIR 300 MG PO CAPS: 300.0000 mg | ORAL_CAPSULE | Freq: Two times a day (BID) | ORAL | 0 refills | Status: AC

## 2023-08-20 NOTE — Discharge Instructions (Signed)
Begin cefdinir every morning and every evening for 7 days to clear any bacteria causing symptoms to prolong  You may use Tessalon pill every 8 hours as needed to help calm your cough,   you may use cough syrup every 6 hours as needed to help with coughing and vomiting, please be mindful this can make you feel drowsy    You can take Tylenol and/or Ibuprofen as needed for fever reduction and pain relief.   For cough: honey 1/2 to 1 teaspoon (you can dilute the honey in water or another fluid).  You can also use guaifenesin and dextromethorphan for cough. You can use a humidifier for chest congestion and cough.  If you don't have a humidifier, you can sit in the bathroom with the hot shower running.      For sore throat: try warm salt water gargles, cepacol lozenges, throat spray, warm tea or water with lemon/honey, popsicles or ice, or OTC cold relief medicine for throat discomfort.   For congestion: take a daily anti-histamine like Zyrtec, Claritin, and a oral decongestant, such as pseudoephedrine.  You can also use Flonase 1-2 sprays in each nostril daily.   It is important to stay hydrated: drink plenty of fluids (water, gatorade/powerade/pedialyte, juices, or teas) to keep your throat moisturized and help further relieve irritation/discomfort.

## 2023-08-20 NOTE — ED Triage Notes (Signed)
Pt reports was sick with viral symptoms since 10/26 but has kept cough.  Feels like has mucous stuck but cannot cough up.  Having back pain from it.  C/o posterior nasal drainage.  No known fever at home.  Pt has HTN hx and has not taken meds today.  Has had some vomiting from cough. No increased WOB noted.

## 2023-08-20 NOTE — ED Provider Notes (Signed)
Renaldo Fiddler    CSN: [id number] Arrival date & time: 08/20/23  4696      History   Chief Complaint Chief Complaint  Patient presents with   Cough    HPI Sonya Bauer is a 32 y.o. female.   Patient presents for evaluation of a productive cough with associated shortness of breath and vomiting, nasal congestion, bilateral ear fullness, intermittent generalized headaches and a scratchy sore throat present for 2 weeks.  Last occurrence of vomiting with coughing this morning.  No known sick contacts prior.  Decreased appetite but able to tolerate some food and liquids.  Has attempted use of antihistamines and over-the-counter cough suppressant which has provided minimal improvement.  Denies respiratory history, non-smoker.  Past Medical History:  Diagnosis Date   Atypical mole 2017   back, dysplastic nevus, exc   Complication of anesthesia    Depression    Family history of adverse reaction to anesthesia    MOM-HAD SEIZURE DURING SURGERY    GERD (gastroesophageal reflux disease)    EVERY OTHER DAY   Gestational diabetes    Gluten intolerance 07/31/2019   Headache    Heart murmur    ASYMPTOMATIC   Heartburn    Hiatal hernia    History of kidney stones    Hypertension    Kidney stones    Motion sickness    long trips   Pregnancy induced hypertension    Thyroid cyst 11/16/2014    Patient Active Problem List   Diagnosis Date Noted   Upper respiratory tract infection 02/15/2023   Urinary tract infection without hematuria 12/11/2022   Depression 12/11/2022   Pelvic pain 05/22/2020   Generalized anxiety disorder 02/05/2020   Lipoma 01/05/2020   Sinus pressure 11/14/2018   Cervical radiculopathy 12/16/2017   Essential hypertension 09/09/2016   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   Multinodular goiter 11/16/2014    Past Surgical History:  Procedure Laterality Date   CYSTOSCOPY WITH STENT PLACEMENT Left 10/06/2016   Procedure: CYSTOSCOPY WITH STENT PLACEMENT;   Surgeon: Sebastian Ache, MD;  Location: [facility];  Service: Urology;  Laterality: Left;   TONSILLECTOMY AND ADENOIDECTOMY N/A 05/20/2018   Procedure: TONSILLECTOMY;  Surgeon: Linus Salmons, MD;  Location: [facility];  Service: ENT;  Laterality: N/A;   URETEROSCOPY WITH HOLMIUM LASER LITHOTRIPSY Left 10/06/2016   Procedure: URETEROSCOPY WITH HOLMIUM LASER LITHOTRIPSY;  Surgeon: Sebastian Ache, MD;  Location: [facility];  Service: Urology;  Laterality: Left;   WISDOM TOOTH EXTRACTION     AGE 13    OB History     Gravida  2   Para  1   Term  1   Preterm      AB      Living  1      SAB      IAB      Ectopic      Multiple  0   Live Births  1            Home Medications    Prior to Admission medications   Medication Sig Start Date End Date Taking? Authorizing Provider  benzonatate (TESSALON) 100 MG capsule Take 1 capsule (100 mg total) by mouth every 8 (eight) hours. 08/20/23  Yes Jsiah Menta R, NP  cefdinir (OMNICEF) 300 MG capsule Take 1 capsule (300 mg total) by mouth 2 (two) times daily for 7 days. 08/20/23 08/27/23 Yes Kelilah Hebard R, NP  promethazine-dextromethorphan (PROMETHAZINE-DM) 6.25-15 MG/5ML syrup Take 5 mLs by mouth 4 (four)  times daily as needed for cough. 08/20/23  Yes Floye Fesler R, NP  amLODipine (NORVASC) 10 MG tablet Take 1 tablet (10 mg total) by mouth daily. 04/26/23 07/25/23  Antonieta Iba, MD  buPROPion (WELLBUTRIN XL) 150 MG 24 hr tablet Take 1 tablet (150 mg total) by mouth every morning. 12/11/22   Orson Eva, NP  cetirizine (ZYRTEC) 10 MG tablet Take 10 mg by mouth daily as needed. 08/06/20   [provider]  Cholecalciferol (VITAMIN D3 PO) Take by mouth.    [provider]  doxycycline (VIBRA-TABS) 100 MG tablet Take 1 tablet (100 mg total) by mouth 2 (two) times daily. Patient not taking: Reported on 07/09/2023 05/26/23   Waldon Merl, PA-C  losartan (COZAAR) 100 MG tablet Take 1 tablet (100 mg  total) by mouth daily. 04/26/23 07/25/23  Antonieta Iba, MD  spironolactone (ALDACTONE) 25 MG tablet Take 1 tablet (25 mg total) by mouth daily. 04/26/23 07/25/23  Antonieta Iba, MD  tirzepatide (ZEPBOUND) 5 MG/0.5ML Pen Inject 5 mg into the skin once a week. 06/03/23   Scoggins, Amber, NP  valACYclovir (VALTREX) 1000 MG tablet Take 1 tablet (1,000 mg total) by mouth daily. 11/06/22   Doreene Burke, CNM    Family History Family History  Problem Relation Age of Onset   Hypertension Father    Asthma Mother    Diabetes Maternal Aunt    Diabetes Maternal Grandmother    Kidney disease Neg Hx    Bladder Cancer Neg Hx    Breast cancer Neg Hx     Social History Social History   Tobacco Use   Smoking status: Never   Smokeless tobacco: Never  Vaping Use   Vaping status: Never Used  Substance Use Topics   Alcohol use: Not Currently    Comment: may have 2-3 drinks 2x/month   Drug use: No     Allergies   Azithromycin, Adalat cc [nifedipine er], and Amoxicillin-pot clavulanate   Review of Systems Review of Systems   Physical Exam Triage Vital Signs ED Triage Vitals  Encounter Vitals Group     BP 08/20/23 0847 (!) 147/109     Systolic BP Percentile --      Diastolic BP Percentile --      Pulse Rate 08/20/23 0847 87     Resp 08/20/23 0847 14     Temp 08/20/23 0847 99.5 F (37.5 C)     Temp Source 08/20/23 0847 Oral     SpO2 08/20/23 0847 96 %     Weight 08/20/23 0848 160 lb (72.6 kg)     Height 08/20/23 0848 5\' 5"  (1.651 m)     Head Circumference --      Peak Flow --      Pain Score 08/20/23 0848 3     Pain Loc --      Pain Education --      Exclude from Growth Chart --    No data found.  Updated Vital Signs BP (!) 147/109   Pulse 87   Temp 99.5 F (37.5 C) (Oral)   Resp 14   Ht 5\' 5"  (1.651 m)   Wt 160 lb (72.6 kg)   LMP 07/27/2023 (Approximate)   SpO2 96%   BMI 26.63 kg/m   Visual Acuity Right Eye Distance:   Left Eye Distance:   Bilateral  Distance:    Right Eye Near:   Left Eye Near:    Bilateral Near:     Physical Exam  Constitutional:      Appearance: She is ill-appearing.  HENT:     Head: Normocephalic.     Right Ear: Tympanic membrane, ear canal and external ear normal.     Left Ear: Tympanic membrane, ear canal and external ear normal.     Nose: Congestion present. No rhinorrhea.     Mouth/Throat:     Pharynx: No oropharyngeal exudate or posterior oropharyngeal erythema.  Eyes:     Extraocular Movements: Extraocular movements intact.  Cardiovascular:     Rate and Rhythm: Normal rate.     Pulses: Normal pulses.     Heart sounds: Normal heart sounds.  Pulmonary:     Effort: Pulmonary effort is normal.     Breath sounds: Normal breath sounds.  Musculoskeletal:     Cervical back: Normal range of motion and neck supple.  Neurological:     Mental Status: She is alert and oriented to person, place, and time. Mental status is at baseline.      UC Treatments / Results  Labs (all labs ordered are listed, but only abnormal results are displayed) Labs Reviewed - No data to display  EKG   Radiology No results found.  Procedures Procedures (including critical care time)  Medications Ordered in UC Medications - No data to display  Initial Impression / Assessment and Plan / UC Course  I have reviewed the triage vital signs and the nursing notes.  Pertinent labs & imaging results that were available during my care of the patient were reviewed by me and considered in my medical decision making (see chart for details).  Acute URI  Patient is in no signs of distress nor toxic appearing.  Vital signs are stable.  Low suspicion for pneumonia, pneumothorax or bronchitis and therefore will defer imaging.  Viral testing deferred due to timeline of illness.  Prescribed cefdinir, Tessalon and Promethazine DM.May use additional over-the-counter medications as needed for supportive care.  May follow-up with urgent care  as needed if symptoms persist or worsen.  Note given.   Final Clinical Impressions(s) / UC Diagnoses   Final diagnoses:  Acute URI     Discharge Instructions      Begin cefdinir every morning and every evening for 7 days to clear any bacteria causing symptoms to prolong  You may use Tessalon pill every 8 hours as needed to help calm your cough,   you may use cough syrup every 6 hours as needed to help with coughing and vomiting, please be mindful this can make you feel drowsy    You can take Tylenol and/or Ibuprofen as needed for fever reduction and pain relief.   For cough: honey 1/2 to 1 teaspoon (you can dilute the honey in water or another fluid).  You can also use guaifenesin and dextromethorphan for cough. You can use a humidifier for chest congestion and cough.  If you don't have a humidifier, you can sit in the bathroom with the hot shower running.      For sore throat: try warm salt water gargles, cepacol lozenges, throat spray, warm tea or water with lemon/honey, popsicles or ice, or OTC cold relief medicine for throat discomfort.   For congestion: take a daily anti-histamine like Zyrtec, Claritin, and a oral decongestant, such as pseudoephedrine.  You can also use Flonase 1-2 sprays in each nostril daily.   It is important to stay hydrated: drink plenty of fluids (water, gatorade/powerade/pedialyte, juices, or teas) to keep your throat moisturized and help further relieve  irritation/discomfort.    ED Prescriptions     Medication Sig Dispense Auth. Provider   cefdinir (OMNICEF) 300 MG capsule Take 1 capsule (300 mg total) by mouth 2 (two) times daily for 7 days. 14 capsule Zaiah Eckerson R, NP   benzonatate (TESSALON) 100 MG capsule Take 1 capsule (100 mg total) by mouth every 8 (eight) hours. 21 capsule Taelynn Mcelhannon R, NP   promethazine-dextromethorphan (PROMETHAZINE-DM) 6.25-15 MG/5ML syrup Take 5 mLs by mouth 4 (four) times daily as needed for cough. 118 mL Zandon Talton,  Elita Boone, NP      PDMP not reviewed this encounter.   Valinda Hoar, [place] 08/20/23 [phone]

## 2023-08-23 DIAGNOSIS — Z03818 Encounter for observation for suspected exposure to other biological agents ruled out: Secondary | ICD-10-CM | POA: Diagnosis not present

## 2023-08-23 DIAGNOSIS — J40 Bronchitis, not specified as acute or chronic: Secondary | ICD-10-CM | POA: Diagnosis not present

## 2023-09-08 ENCOUNTER — Other Ambulatory Visit: Payer: Self-pay

## 2023-09-08 ENCOUNTER — Encounter: Payer: Self-pay | Admitting: Family

## 2023-09-08 DIAGNOSIS — F32A Depression, unspecified: Secondary | ICD-10-CM

## 2023-09-08 MED ORDER — ZEPBOUND 7.5 MG/0.5ML ~~LOC~~ SOAJ: 7.5000 mg | SUBCUTANEOUS | 3 refills | Status: DC

## 2023-09-08 MED ORDER — BUPROPION HCL ER (XL) 150 MG PO TB24: 150.0000 mg | ORAL_TABLET | Freq: Every morning | ORAL | 1 refills | Status: DC

## 2023-09-08 MED ORDER — CETIRIZINE HCL 10 MG PO TABS: 10.0000 mg | ORAL_TABLET | Freq: Every day | ORAL | 1 refills | Status: DC | PRN

## 2023-10-17 ENCOUNTER — Encounter: Payer: Self-pay | Admitting: Family

## 2023-10-19 ENCOUNTER — Other Ambulatory Visit: Payer: Self-pay

## 2023-10-19 ENCOUNTER — Ambulatory Visit: Payer: BC Managed Care – PPO | Attending: Cardiology

## 2023-10-19 DIAGNOSIS — Q796 Ehlers-Danlos syndrome, unspecified: Secondary | ICD-10-CM

## 2023-10-19 DIAGNOSIS — R0602 Shortness of breath: Secondary | ICD-10-CM

## 2023-10-19 LAB — ECHOCARDIOGRAM COMPLETE
AR max vel: 2.84 cm2
AV Area VTI: 3.18 cm2
AV Area mean vel: 2.9 cm2
AV Mean grad: 3 mm[Hg]
AV Peak grad: 5.9 mm[Hg]
Ao pk vel: 1.21 m/s
Area-P 1/2: 4.6 cm2
Calc EF: 56.9 %
S' Lateral: 3 cm
Single Plane A2C EF: 54.5 %
Single Plane A4C EF: 58.8 %

## 2023-10-19 MED ORDER — ZEPBOUND 10 MG/0.5ML ~~LOC~~ SOAJ: 10.0000 mg | SUBCUTANEOUS | 1 refills | Status: DC

## 2023-10-20 NOTE — Progress Notes (Signed)
 Heart squeeze was noted 55-60% with normal function.  There were no wall motion abnormalities that were noted.  There was mild leakage noted in the mitral valve.  No other valvular changes were noted.  No findings to explain symptoms.

## 2023-10-22 ENCOUNTER — Ambulatory Visit: Payer: BC Managed Care – PPO | Admitting: Cardiology

## 2023-10-22 NOTE — Progress Notes (Deleted)
  Cardiology Office Note:  .   Date:  10/22/2023  ID:  Sonya Bauer, DOB 1991-08-02, MRN [id number] PCP: Glennon Sand, NP  [facility] Providers Cardiologist:  None { Click to update primary MD,subspecialty MD or APP then REFRESH:1}   History of Present Illness: .   Sonya Bauer is a 33 y.o. female with past medical history of hypertension, cervical radiculopathy, depression/anxiety, heart murmur, GERD, thyroid  cyst (11/2014), is here today for follow-up.  She was last seen in clinic 07/09/2023.  She had similar complaints concerns at that time, mainly GI symptoms and abdominal pain.  She had submitted to cardiology for concern of Ehlers-Danlos syndrome.  She was found that she would benefit from an echo and genetic testing for Ehlers-Danlos syndrome but would likely not be covered by insurance.  She was scheduled for a repeat echocardiogram.  She returns to clinic today  ROS: 10 point review of system reviewed and negative with exception was been listed in the HPI  Studies Reviewed: .       2D echo 10/19/2023 1. Left ventricular ejection fraction, by estimation, is 55 to 60%. The  left ventricle has normal function. The left ventricle has no regional  wall motion abnormalities. Left ventricular diastolic parameters were  normal.   2. Right ventricular systolic function is normal. The right ventricular  size is normal. Tricuspid regurgitation signal is inadequate for assessing  PA pressure.   3. The mitral valve is normal in structure. Mild mitral valve  regurgitation. No evidence of mitral stenosis.   4. The aortic valve has an indeterminant number of cusps. Aortic valve  regurgitation is not visualized. No aortic stenosis is present.   5. The inferior vena cava is normal in size with greater than 50%  respiratory variability, suggesting right atrial pressure of 3 mmHg.   TTE 07/2017 (outside facility) Revealed LVEF 74%, G3 DD, mild TR, and mild MR   Stress  testing 07/2017 (outside facility) Revealed normal stress up with normal EF   Renal Artery Duplex 02/22/2017 IMPRESSION: 1. No explanation for patient's resistant hypertension, specifically, no evidence of renal artery stenosis. 2. Findings suggestive of hepatic steatosis. Correlation with LFTs could be performed as clinically indicated. Risk Assessment/Calculations:     No BP recorded.  {Refresh Note OR Click here to enter BP  :1}***       Physical Exam:   VS:  There were no vitals taken for this visit.   Wt Readings from Last 3 Encounters:  08/20/23 160 lb (72.6 kg)  07/09/23 168 lb 6.4 oz (76.4 kg)  04/19/23 188 lb 8 oz (85.5 kg)    GEN: Well nourished, well developed in no acute distress NECK: No JVD; No carotid bruits CARDIAC: ***RRR, no murmurs, rubs, gallops RESPIRATORY:  Clear to auscultation without rales, wheezing or rhonchi  ABDOMEN: Soft, non-tender, non-distended EXTREMITIES:  No edema; No deformity   ASSESSMENT AND PLAN: .   ***    {Are you ordering a CV Procedure (e.g. stress test, cath, DCCV, TEE, etc)?   Press F2        :[id number]}  Dispo: ***  Signed, Hung Rhinesmith, NP

## 2023-11-11 DIAGNOSIS — I1A0 Resistant hypertension: Secondary | ICD-10-CM | POA: Diagnosis not present

## 2023-11-11 DIAGNOSIS — G4710 Hypersomnia, unspecified: Secondary | ICD-10-CM | POA: Diagnosis not present

## 2023-11-15 ENCOUNTER — Encounter: Payer: Self-pay | Admitting: Licensed Practical Nurse

## 2023-11-15 ENCOUNTER — Other Ambulatory Visit: Payer: Self-pay | Admitting: Licensed Practical Nurse

## 2023-11-15 ENCOUNTER — Ambulatory Visit
Admission: RE | Admit: 2023-11-15 | Discharge: 2023-11-15 | Disposition: A | Discharge status: Home / Self Care | Payer: BC Managed Care – PPO | Source: Ambulatory Visit | Attending: Licensed Practical Nurse | Admitting: Licensed Practical Nurse

## 2023-11-15 ENCOUNTER — Other Ambulatory Visit (HOSPITAL_COMMUNITY)
Admission: RE | Admit: 2023-11-15 | Discharge: 2023-11-15 | Disposition: A | Discharge status: Home / Self Care | Payer: BC Managed Care – PPO | Source: Ambulatory Visit | Attending: Licensed Practical Nurse | Admitting: Licensed Practical Nurse

## 2023-11-15 ENCOUNTER — Ambulatory Visit (INDEPENDENT_AMBULATORY_CARE_PROVIDER_SITE_OTHER): Payer: BC Managed Care – PPO | Admitting: Licensed Practical Nurse

## 2023-11-15 VITALS — BP 143/94 | HR 84 | Ht 65.0 in | Wt 152.2 lb

## 2023-11-15 DIAGNOSIS — N939 Abnormal uterine and vaginal bleeding, unspecified: Secondary | ICD-10-CM

## 2023-11-15 DIAGNOSIS — Z01419 Encounter for gynecological examination (general) (routine) without abnormal findings: Secondary | ICD-10-CM | POA: Diagnosis not present

## 2023-11-15 DIAGNOSIS — Z124 Encounter for screening for malignant neoplasm of cervix: Secondary | ICD-10-CM | POA: Diagnosis not present

## 2023-11-15 DIAGNOSIS — Z113 Encounter for screening for infections with a predominantly sexual mode of transmission: Secondary | ICD-10-CM | POA: Diagnosis not present

## 2023-11-15 MED ORDER — MEDROXYPROGESTERONE ACETATE 10 MG PO TABS: 20.0000 mg | ORAL_TABLET | Freq: Three times a day (TID) | ORAL | 2 refills | Status: DC

## 2023-11-15 MED ORDER — TRANEXAMIC ACID 650 MG PO TABS: 1300.0000 mg | ORAL_TABLET | Freq: Three times a day (TID) | ORAL | 0 refills | Status: DC

## 2023-11-15 NOTE — Progress Notes (Signed)
GYN ENCOUNTER  Encounter for Abnormal Uterine Bleeding  Subjective  HPI: Sonya Bauer is a 33 y.o. G2P1001 who presents today for abnormal uterine bleeding after taking Plan B on January 12th. She reports getting her period on October 20, 2023 but had unprotected intercourse with her husband and wanted to be cautious, as they are not open to pregnancy at this time. She has continued to have heavy bleeding every day since she began bleeding again on January 25th, and is passing clots multiple times a day that are larger than a golf ball in size. She reports feeling more tired than usual, but denies dizziness and shortness of breath. She reports she has taken Plan B one time in the past, about 2 years ago, but did not have these symptoms. She also reports having dyspareunia with deeper penetration and is sore for up to a day after intercourse.   Sonya Bauer sees cardiology for medication management of Essential Hypertension.   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  She wears seatbelts and denies texting and driving.  Guns are in the home but are secured.  She feels safe in her home.  She has one female partner, her husband, and is sexually active with him. They are open to pregnancy in the future. She is not on contraception at this time, and declines contraception.     Past Medical History:  Diagnosis Date   Atypical mole 2017   back, dysplastic nevus, exc   Complication of anesthesia    Depression    Family history of adverse reaction to anesthesia    MOM-HAD SEIZURE DURING SURGERY    GERD (gastroesophageal reflux disease)    EVERY OTHER DAY   Gestational diabetes    Gluten intolerance 07/31/2019   Headache    Heart murmur    ASYMPTOMATIC   Heartburn    Hiatal hernia    History of kidney stones    Hypertension    Kidney stones    Motion sickness    long trips   Pregnancy induced hypertension    Thyroid cyst 11/16/2014   Past Surgical  History:  Procedure Laterality Date   CYSTOSCOPY WITH STENT PLACEMENT Left 10/06/2016   Procedure: CYSTOSCOPY WITH STENT PLACEMENT;  Surgeon: Sebastian Ache, MD;  Location: [facility];  Service: Urology;  Laterality: Left;   TONSILLECTOMY AND ADENOIDECTOMY N/A 05/20/2018   Procedure: TONSILLECTOMY;  Surgeon: Linus Salmons, MD;  Location: [facility];  Service: ENT;  Laterality: N/A;   URETEROSCOPY WITH HOLMIUM LASER LITHOTRIPSY Left 10/06/2016   Procedure: URETEROSCOPY WITH HOLMIUM LASER LITHOTRIPSY;  Surgeon: Sebastian Ache, MD;  Location: [facility];  Service: Urology;  Laterality: Left;   WISDOM TOOTH EXTRACTION     AGE 8   OB History     Gravida  2   Para  1   Term  1   Preterm      AB      Living  1      SAB      IAB      Ectopic      Multiple  0   Live Births  1          Allergies  Allergen Reactions   Azithromycin Nausea And Vomiting, Diarrhea and Other (See Comments)   Adalat Cc [Nifedipine Er]    Amoxicillin-Pot Clavulanate Nausea And Vomiting and Nausea Only    vomiting    Review of Systems  12 point ROS negative  except for pertinent positives noted in HPO above.   Objective  BP (!) 143/94 (BP Location: Right Arm, Patient Position: Sitting, Cuff Size: Normal)   Pulse 84   Ht 5\' 5"  (1.651 m)   Wt 69 kg   LMP 10/20/2023 (Approximate)   BMI 25.33 kg/m   Physical examination GENERAL APPEARANCE: alert, well appearing, in no apparent distress, oriented to person, place and time THYROID: no thyromegaly or masses present BREASTS: no masses noted, no significant tenderness, no skin changes. Nontender, moveable, soft mass noted in R axilla that has been present since breastfeeding after first pregnancy.  LUNGS: clear to auscultation, no wheezes, rales or rhonchi, symmetric air entry HEART: regular rate and rhythm, no murmurs ABDOMEN: soft, nondistended, no abnormal masses, no epigastric pain. Tenderness noted in RLQ with deep palpation.   EXTREMITIES: no redness or tenderness in the calves or thighs, no edema SKIN: normal coloration and turgor, no rashes LYMPH NODES: no adenopathy palpable NEUROLOGIC: alert, oriented, normal speech, no focal findings or movement disorder noted  External Genitalia: no lesions or asymmetry  Cervix: no lesions or polyps. Bloody discharge coming from cervical os.  Uterus/Adnexae: no palpable masses, uterus midposition and size appropriate.  Vagina: Well-rugated, no lesions.  Rectum: deferred.    Assessment/Plan -33 year old with abnormal uterine bleeding post Plan B -Will send CBC, TSH & Free T4, Coags, CMP and pelvic ultrasound.  -Bleeding precautions discussed including when to present to emergency room.  -After shared decision-making, she elected to try a trial of Provera for bleeding.  -Will follow labs and ultrasound results and advise as results come back.   Eloise Levels, Student-MidWife  11/15/23 10:19 AM  Carie Caddy, CNM present for all portions of care.

## 2023-11-15 NOTE — Progress Notes (Signed)
Pt seen today for AUB, US done today shows atrophic endometrium at 3mm, no fibroids or cyst visualized. Reviewed [place] with Dr Lonny Prude. Per Dr Lonny Prude start TXA 1,300mg  TIC x 2wks in addition to Provera.   TC to pt, reviewed [place] and treatment recommendations.  On further questioning pt reports she has always had heavy periods, needing to change her tampon every 2 hours for the first 2 days, recently they have gotten worse. She also has significant  pain in her hips during her periods. She has never been evaluated with endometriosis, but wonders if her has it. Her mother may have endo, and her sister has dealt with fibroids. Sonya Bauer is interested in an endo evaluation, she is aware surgery is the only way to diagnose.  Pt transferred to front desk to schedule appointment with MD Carie Caddy, CNM  [facility]  11/15/23  12:02 PM

## 2023-11-16 ENCOUNTER — Encounter: Payer: Self-pay | Admitting: Licensed Practical Nurse

## 2023-11-16 ENCOUNTER — Encounter: Payer: Self-pay | Admitting: Cardiology

## 2023-11-16 ENCOUNTER — Ambulatory Visit: Payer: BC Managed Care – PPO | Attending: Cardiology | Admitting: Cardiology

## 2023-11-16 VITALS — BP 128/88 | HR 86 | Ht 62.0 in | Wt 150.2 lb

## 2023-11-16 DIAGNOSIS — R0602 Shortness of breath: Secondary | ICD-10-CM | POA: Diagnosis not present

## 2023-11-16 DIAGNOSIS — R5383 Other fatigue: Secondary | ICD-10-CM

## 2023-11-16 DIAGNOSIS — Q796 Ehlers-Danlos syndrome, unspecified: Secondary | ICD-10-CM | POA: Diagnosis not present

## 2023-11-16 DIAGNOSIS — E041 Nontoxic single thyroid nodule: Secondary | ICD-10-CM

## 2023-11-16 DIAGNOSIS — I10 Essential (primary) hypertension: Secondary | ICD-10-CM | POA: Diagnosis not present

## 2023-11-16 DIAGNOSIS — F411 Generalized anxiety disorder: Secondary | ICD-10-CM

## 2023-11-16 LAB — CERVICOVAGINAL ANCILLARY ONLY
Bacterial Vaginitis (gardnerella): NEGATIVE
Candida Glabrata: NEGATIVE
Candida Vaginitis: NEGATIVE
Chlamydia: NEGATIVE
Comment: NEGATIVE
Comment: NEGATIVE
Comment: NEGATIVE
Comment: NEGATIVE
Comment: NEGATIVE
Comment: NORMAL
Neisseria Gonorrhea: NEGATIVE
Trichomonas: NEGATIVE

## 2023-11-16 LAB — COMPREHENSIVE METABOLIC PANEL
ALT: 15 [IU]/L (ref 0–32)
AST: 15 [IU]/L (ref 0–40)
Albumin: 4.8 g/dL (ref 3.9–4.9)
Alkaline Phosphatase: 68 [IU]/L (ref 44–121)
BUN/Creatinine Ratio: 21 (ref 9–23)
BUN: 15 mg/dL (ref 6–20)
Bilirubin Total: 0.6 mg/dL (ref 0.0–1.2)
CO2: 23 mmol/L (ref 20–29)
Calcium: 9.9 mg/dL (ref 8.7–10.2)
Chloride: 106 mmol/L (ref 96–106)
Creatinine, Ser: 0.73 mg/dL (ref 0.57–1.00)
Globulin, Total: 2.2 g/dL (ref 1.5–4.5)
Glucose: 82 mg/dL (ref 70–99)
Potassium: 4 mmol/L (ref 3.5–5.2)
Sodium: 144 mmol/L (ref 134–144)
Total Protein: 7 g/dL (ref 6.0–8.5)
eGFR: 112 mL/min/{1.73_m2} (ref >59)

## 2023-11-16 LAB — CBC
Hematocrit: 40.6 % (ref 34.0–46.6)
Hemoglobin: 13.6 g/dL (ref 11.1–15.9)
MCH: 28.5 pg (ref 26.6–33.0)
MCHC: 33.5 g/dL (ref 31.5–35.7)
MCV: 85 fL (ref 79–97)
Platelets: 340 10*3/uL (ref 150–450)
RBC: 4.77 x10E6/uL (ref 3.77–5.28)
RDW: 12.7 % (ref 11.7–15.4)
WBC: 5.5 10*3/uL (ref 3.4–10.8)

## 2023-11-16 LAB — PROTIME-INR
INR: 1 (ref 0.9–1.2)
Prothrombin Time: 11.1 s (ref 9.1–12.0)

## 2023-11-16 LAB — TSH+FREE T4
Free T4: 1.25 ng/dL (ref 0.82–1.77)
TSH: 1.01 u[IU]/mL (ref 0.450–4.500)

## 2023-11-16 NOTE — Patient Instructions (Addendum)
Medication Instructions:   Your physician recommends that you continue on your current medications as directed. Please refer to the Current Medication list given to you today.  *If you need a refill on your cardiac medications before your next appointment, please call your pharmacy*   Lab Work:  No labs ordered today  If you have labs (blood work) drawn today and your tests are completely normal, you will receive your results only by: MyChart Message (if you have MyChart) OR A paper copy in the mail If you have any lab test that is abnormal or we need to change your treatment, we will call you to review the results.   Testing/Procedures:  You have been referred to :   [facility]  for Ehlers-Danlos syndrome    Follow-Up:  At [facility], you and your health needs are our priority.  As part of our continuing mission to provide you with exceptional heart care, we have created designated Provider Care Teams.  These Care Teams include your primary Cardiologist (physician) and Advanced Practice Providers (APPs -  Physician Assistants and Nurse Practitioners) who all work together to provide you with the care you need, when you need it.  Your next appointment:   6 month(s)  Provider:   You may see Dr Mariah Milling  or one of the following Advanced Practice Providers on your designated Care Team:   Nicolasa Ducking, NP Eula Listen, PA-C Cadence Fransico Michael, PA-C Charlsie Quest, NP Carlos Levering, NP

## 2023-11-16 NOTE — Progress Notes (Signed)
 .[facility] Cardiology Office Note:  .   Date:  11/16/2023  ID:  Sonya Bauer, DOB December 29, 1990, MRN [id number] PCP: Glennon Sand, NP  [facility] Providers Cardiologist:  None    History of Present Illness: .   Sonya Bauer is a 33 y.o. female with past medical history of hypertension, cervical radiculopathy, depression/anxiety, heart murmur, GERD, thyroid  cyst (11/2014), is here today for follow-up.  She was last seen in clinic 07/09/2023.  She had similar complaints concerns at that time, mainly GI symptoms and abdominal pain.  She had some permanent impairment extreme and she has symptoms of abdominal pain, early satiety, nausea without vomiting and was evaluated by GI and was sent to [facility] cardiology  for concern of Ehlers-Danlos syndrome.  She was found that she would benefit from an echo and genetic testing for Ehlers-Danlos syndrome but would likely not be covered by insurance.  She was scheduled for a echocardiogram.  She returns to clinic today stating that she has finally started to feel better after recently being treated for walking pneumonia.  And then several and goals.  She continues to have joint issues with hypermobility, has been following up with OB-GYN for other new concerns.  She denies any chest pain, chest tightness, palpitations.  She does have continued shortness of breath and fatigue.  Denies any recent hospitalizations or visits to the emergency department.  ROS: 10 point review of system reviewed and negative with exception was been listed in the HPI  Studies Reviewed: .       2D echo 10/19/2023 1. Left ventricular ejection fraction, by estimation, is 55 to 60%. The  left ventricle has normal function. The left ventricle has no regional  wall motion abnormalities. Left ventricular diastolic parameters were  normal.   2. Right ventricular systolic function is normal. The right ventricular  size is normal. Tricuspid regurgitation signal is inadequate for  assessing  PA pressure.   3. The mitral valve is normal in structure. Mild mitral valve  regurgitation. No evidence of mitral stenosis.   4. The aortic valve has an indeterminant number of cusps. Aortic valve  regurgitation is not visualized. No aortic stenosis is present.   5. The inferior vena cava is normal in size with greater than 50%  respiratory variability, suggesting right atrial pressure of 3 mmHg.   TTE 07/2017 (outside facility) Revealed LVEF 74%, G3 DD, mild TR, and mild MR   Stress testing 07/2017 (outside facility) Revealed normal stress up with normal EF   Renal Artery Duplex 02/22/2017 IMPRESSION: 1. No explanation for patient's resistant hypertension, specifically, no evidence of renal artery stenosis. 2. Findings suggestive of hepatic steatosis. Correlation with LFTs could be performed as clinically indicated. Risk Assessment/Calculations:             Physical Exam:   VS:  LMP 10/20/2023 (Approximate)    Wt Readings from Last 3 Encounters:  11/15/23 152 lb 3.2 oz (69 kg)  08/20/23 160 lb (72.6 kg)  07/09/23 168 lb 6.4 oz (76.4 kg)    GEN: Well nourished, well developed in no acute distress NECK: No JVD; No carotid bruits CARDIAC: RRR, no murmurs, rubs, gallops RESPIRATORY:  Clear to auscultation without rales, wheezing or rhonchi  ABDOMEN: Soft, non-tender, non-distended EXTREMITIES:  No edema; No deformity   ASSESSMENT AND PLAN: .   Primary hypertension.  Blood pressure today of 128/88.  Blood pressure remained stable.  She is continued on amlodipine  10 mg daily, losartan  100 mg daily,  and spironolactone  25 mg daily.  She has longstanding history of resistant hypertension with workup for renal artery stenosis that was negative.  Previously she was on HCTZ therapy discontinued due to frequent UTIs.  She has been encouraged to continue to monitor pressures 1 to 2 hours postmedication administration.  Occasional shortness of breath with associated fatigue  after having COVID and walking pneumonia.  Echocardiogram that was previously completed revealed an LVEF 55 to 60%, no RWMA, mild MR with no concerns of aortic dilatation.  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  She is being referred to rheumatology for further workup and treatment.  History of thyroid  cyst in 2016 with thyroid  nodule with recent follow-up.  She has a thyroid  ultrasound that had previously been ordered that she was completed through her PCP.  TSH and free T4 levels managed by her PCP.  Generalized anxiety with insomnia.  Previously was scheduled for home sleep study.  Anxiety continues to be managed by PCP.       Dispo: Patient return to clinic to see MD/APP in 6 months or sooner if needed  Signed, Kameryn Davern, NP

## 2023-11-18 ENCOUNTER — Encounter: Payer: Self-pay | Admitting: Dermatology

## 2023-11-18 MED ORDER — CLOBETASOL PROPIONATE 0.05 % EX CREA: TOPICAL_CREAM | CUTANEOUS | 0 refills | Status: DC

## 2023-11-19 LAB — CYTOLOGY - PAP
Comment: NEGATIVE
Diagnosis: NEGATIVE
High risk HPV: NEGATIVE

## 2023-11-25 NOTE — Progress Notes (Signed)
    GYNECOLOGY PROGRESS NOTE  Subjective:    Patient ID: Sonya Bauer, female    DOB: 04-24-1991, 33 y.o.   MRN: [id number]  HPI  Patient is a 33 y.o. G65P1000 female who presents for evaluation of atrophic endometrium. She was previously seen by Carie Caddy, CNM for abnormal uterine bleeding. US done today shows atrophic endometrium at 3mm, no fibroids or cyst visualized. She reports she has always had heavy periods, needing to change her tampon every 2 hours for the first 2 days, recently they have gotten worse. She also has significant pain in her hips during her periods. She has never been evaluated with endometriosis, but wonders if her has it. Her mother may have endometriosis, and her sister has dealt with fibroids. Sonya Bauer is interested in an endometriosis evaluation, she is aware surgery is the only way to diagnose. Today she reports that she continues to spot. She has some back pain that radiates down her legs x 2 weeks. Has tried gabapentin which did not help much. Pain occurs with almost every cycle for the past few months.  She feels better since she has stopped taking the Provera.  Has also had COVID twice, and feels like her pain and cycles worsened after each episode. Has additionally had an episode of shingles, which also caused pain.   Cycle currently lasts 4 days, but very heavy for first 2 days.  Cycles vary between 24-35 days. Does note taking a Plan B in January and had prolonged bleeding. Has tried Mirena in the past, had persistent breakthrough bleeding. Tried combined OCPs in the remote past, notes her period improved but her mood was significantly affected.   The following portions of the patient's history were reviewed and updated as appropriate: allergies, current medications, past family history, past medical history, past social history, past surgical history, and problem list.  Review of Systems Pertinent items noted in HPI and remainder of comprehensive ROS  otherwise negative.   Objective:   Blood pressure (!) 118/92, pulse 88, height 5\' 2"  (1.575 m), weight 145 lb 3.2 oz (65.9 kg), last menstrual period 10/20/2023.  Body mass index is 26.56 kg/m. General appearance: alert and no distress Abdomen: soft, non-tender; bowel sounds normal; no masses,  no organomegaly Pelvic: deferred Extremities: extremities normal, atraumatic, no cyanosis or edema Neurologic: Grossly normal   Assessment:   1. Abnormal uterine bleeding   2. Dysmenorrhea   3. Radicular leg pain      Plan:   Discussion had with patient regarding evaluation of endometriosis.  Advised that this was typically a diagnosis of exclusion, and that surgical exploration with laparoscopy was gold standard of diagnosis. However also discussed that there were new medications available that may be helpful for mangement of hr symptoms such that surgical exploration could be a last resort.  Discussed newer generation POPs, as well as GnRH antagonist such as Dewayne Hatch or Depot Lupron.  Patient willing to try medication prior to surgical exploration.  Given 1 month sample of Slynd. Will also proceed with scheduling for laparoscopic evaluation if medication fails.  Advised that if on f/u next month her symptoms improve, can cancel her surgery. Will schedule surgery for 01/07/24.    A total of 22 minutes were spent during this encounter, including review of previous progress notes, recent imaging and labs, face-to-face with time with patient involving counseling and coordination of care, as well as documentation for current visit.   Hildred Laser, MD [facility]

## 2023-11-26 ENCOUNTER — Encounter: Payer: Self-pay | Admitting: Obstetrics and Gynecology

## 2023-11-26 ENCOUNTER — Ambulatory Visit: Payer: BC Managed Care – PPO | Admitting: Obstetrics and Gynecology

## 2023-11-26 VITALS — BP 118/92 | HR 88 | Ht 62.0 in | Wt 145.2 lb

## 2023-11-26 DIAGNOSIS — Z01818 Encounter for other preprocedural examination: Secondary | ICD-10-CM | POA: Diagnosis not present

## 2023-11-26 DIAGNOSIS — N939 Abnormal uterine and vaginal bleeding, unspecified: Secondary | ICD-10-CM

## 2023-11-26 DIAGNOSIS — N946 Dysmenorrhea, unspecified: Secondary | ICD-10-CM | POA: Diagnosis not present

## 2023-11-26 DIAGNOSIS — M5410 Radiculopathy, site unspecified: Secondary | ICD-10-CM

## 2023-12-06 MED ORDER — CLOBETASOL PROPIONATE 0.05 % EX CREA: TOPICAL_CREAM | CUTANEOUS | 0 refills | Status: AC

## 2023-12-06 NOTE — Addendum Note (Signed)
 Addended by: Cari Caraway A on: 12/06/2023 08:43 AM   Modules accepted: Orders

## 2023-12-27 ENCOUNTER — Other Ambulatory Visit: Payer: Self-pay

## 2023-12-27 ENCOUNTER — Encounter: Payer: Self-pay | Admitting: Family

## 2023-12-27 DIAGNOSIS — E663 Overweight: Secondary | ICD-10-CM

## 2023-12-27 DIAGNOSIS — I10 Essential (primary) hypertension: Secondary | ICD-10-CM

## 2023-12-27 DIAGNOSIS — R7301 Impaired fasting glucose: Secondary | ICD-10-CM

## 2023-12-27 DIAGNOSIS — R5383 Other fatigue: Secondary | ICD-10-CM

## 2023-12-28 ENCOUNTER — Other Ambulatory Visit: Payer: BC Managed Care – PPO

## 2023-12-28 ENCOUNTER — Other Ambulatory Visit: Payer: Self-pay | Admitting: Cardiology

## 2023-12-28 MED ORDER — GABAPENTIN 100 MG PO CAPS: 100.0000 mg | ORAL_CAPSULE | Freq: Every day | ORAL | 2 refills | Status: AC

## 2024-01-07 ENCOUNTER — Ambulatory Visit: Admit: 2024-01-07 | Payer: BC Managed Care – PPO | Admitting: Obstetrics and Gynecology

## 2024-01-07 SURGERY — EXCISION, ENDOMETRIOSIS, LAPAROSCOPIC
Anesthesia: General

## 2024-01-13 ENCOUNTER — Other Ambulatory Visit

## 2024-01-13 DIAGNOSIS — E663 Overweight: Secondary | ICD-10-CM | POA: Diagnosis not present

## 2024-01-13 DIAGNOSIS — R7301 Impaired fasting glucose: Secondary | ICD-10-CM | POA: Diagnosis not present

## 2024-01-13 DIAGNOSIS — I10 Essential (primary) hypertension: Secondary | ICD-10-CM | POA: Diagnosis not present

## 2024-01-13 DIAGNOSIS — R5383 Other fatigue: Secondary | ICD-10-CM | POA: Diagnosis not present

## 2024-01-14 DIAGNOSIS — R109 Unspecified abdominal pain: Secondary | ICD-10-CM | POA: Diagnosis not present

## 2024-01-14 LAB — CMP14+EGFR
ALT: 12 IU/L (ref 0–32)
AST: 13 IU/L (ref 0–40)
Albumin: 4.6 g/dL (ref 3.9–4.9)
Alkaline Phosphatase: 54 IU/L (ref 44–121)
BUN/Creatinine Ratio: 13 (ref 9–23)
BUN: 10 mg/dL (ref 6–20)
Bilirubin Total: 0.6 mg/dL (ref 0.0–1.2)
CO2: 21 mmol/L (ref 20–29)
Calcium: 9.6 mg/dL (ref 8.7–10.2)
Chloride: 106 mmol/L (ref 96–106)
Creatinine, Ser: 0.8 mg/dL (ref 0.57–1.00)
Globulin, Total: 1.8 g/dL (ref 1.5–4.5)
Glucose: 75 mg/dL (ref 70–99)
Potassium: 4.5 mmol/L (ref 3.5–5.2)
Sodium: 142 mmol/L (ref 134–144)
Total Protein: 6.4 g/dL (ref 6.0–8.5)
eGFR: 100 mL/min/{1.73_m2} (ref >59)

## 2024-01-14 LAB — HEMOGLOBIN A1C
Est. average glucose Bld gHb Est-mCnc: 80 mg/dL
Hgb A1c MFr Bld: 4.4 % — ABNORMAL LOW (ref 4.8–5.6)

## 2024-01-14 LAB — LIPID PANEL
Chol/HDL Ratio: 3 ratio (ref 0.0–4.4)
Cholesterol, Total: 133 mg/dL (ref 100–199)
HDL: 44 mg/dL (ref >39)
LDL Chol Calc (NIH): 78 mg/dL (ref 0–99)
Triglycerides: 50 mg/dL (ref 0–149)
VLDL Cholesterol Cal: 11 mg/dL (ref 5–40)

## 2024-01-14 LAB — TSH: TSH: 1.03 u[IU]/mL (ref 0.450–4.500)

## 2024-01-18 DIAGNOSIS — K219 Gastro-esophageal reflux disease without esophagitis: Secondary | ICD-10-CM | POA: Diagnosis not present

## 2024-01-19 ENCOUNTER — Encounter: Admitting: Obstetrics and Gynecology

## 2024-01-20 ENCOUNTER — Encounter: Payer: Self-pay | Admitting: Cardiology

## 2024-01-20 ENCOUNTER — Ambulatory Visit (INDEPENDENT_AMBULATORY_CARE_PROVIDER_SITE_OTHER): Admitting: Cardiology

## 2024-01-20 VITALS — BP 126/84 | HR 88 | Ht 62.0 in | Wt 136.0 lb

## 2024-01-20 DIAGNOSIS — I10 Essential (primary) hypertension: Secondary | ICD-10-CM | POA: Diagnosis not present

## 2024-01-20 DIAGNOSIS — F411 Generalized anxiety disorder: Secondary | ICD-10-CM | POA: Diagnosis not present

## 2024-01-20 DIAGNOSIS — F331 Major depressive disorder, recurrent, moderate: Secondary | ICD-10-CM | POA: Diagnosis not present

## 2024-01-20 MED ORDER — VALACYCLOVIR HCL 1 G PO TABS: 1000.0000 mg | ORAL_TABLET | Freq: Every day | ORAL | 3 refills | Status: AC

## 2024-01-20 MED ORDER — ZEPBOUND 7.5 MG/0.5ML ~~LOC~~ SOAJ: 7.5000 mg | SUBCUTANEOUS | 2 refills | Status: DC

## 2024-01-20 NOTE — Progress Notes (Signed)
 Established Patient Office Visit  Subjective:  Patient ID: Sonya Bauer, female    DOB: 01-27-1991  Age: 33 y.o. MRN: [id number]  Chief Complaint  Patient presents with   Follow-up    3 Months Follow Up    Patient in office for regular follow up, discuss recent lab work. Patient complains of feeling tired throughout the day, occasional orthostatic dizziness.  Discussed recent lab work. LDL well controlled. Hgb A1c normal. All other labs normal.  Patient on Zepbound 10 mg weekly. Patient does not want to lose any more weight. Will work on titrating dose down.  Patient has history of low vitamin D and low vitamin B12 levels, does not take supplements consistently. Recommend taking daily, notify provider if daily dose does not help fatigue. Also, recommend drinking plenty of water daily.     No other concerns at this time.   Past Medical History:  Diagnosis Date   Atypical mole 2017   back, dysplastic nevus, exc   Complication of anesthesia    Depression    Family history of adverse reaction to anesthesia    MOM-HAD SEIZURE DURING SURGERY    GERD (gastroesophageal reflux disease)    EVERY OTHER DAY   Gestational diabetes    Gluten intolerance 07/31/2019   Headache    Heart murmur    ASYMPTOMATIC   Heartburn    Hiatal hernia    History of kidney stones    Hypertension    Kidney stones    Motion sickness    long trips   Pregnancy induced hypertension    Thyroid cyst 11/16/2014    Past Surgical History:  Procedure Laterality Date   CYSTOSCOPY WITH STENT PLACEMENT Left 10/06/2016   Procedure: CYSTOSCOPY WITH STENT PLACEMENT;  Surgeon: Sebastian Ache, MD;  Location: [facility];  Service: Urology;  Laterality: Left;   TONSILLECTOMY AND ADENOIDECTOMY N/A 05/20/2018   Procedure: TONSILLECTOMY;  Surgeon: Linus Salmons, MD;  Location: [facility];  Service: ENT;  Laterality: N/A;   URETEROSCOPY WITH HOLMIUM LASER LITHOTRIPSY Left 10/06/2016   Procedure:  URETEROSCOPY WITH HOLMIUM LASER LITHOTRIPSY;  Surgeon: Sebastian Ache, MD;  Location: [facility];  Service: Urology;  Laterality: Left;   WISDOM TOOTH EXTRACTION     AGE 84    Social History   Socioeconomic History   Marital status: Married    Spouse name: Not on file   Number of children: 2   Years of education: Not on file   Highest education level: Bachelor's degree (e.g., BA, AB, BS)  Occupational History   Occupation: unemployed   Tobacco Use   Smoking status: Never   Smokeless tobacco: Never  Vaping Use   Vaping status: Never Used  Substance and Sexual Activity   Alcohol use: Not Currently    Comment: may have 2-3 drinks 2x/month   Drug use: No   Sexual activity: Yes    Birth control/protection: None  Other Topics Concern   Not on file  Social History Narrative   Patient is married and has two children ages 46yo and 74mo. 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 She reports having a supportive marriage, supportive family relationships, and has a few close friends.    Social Drivers of Corporate investment banker Strain: Not on file  Food Insecurity: Not on file  Transportation Needs: Not on file  Physical Activity: Not on file  Stress: Not on file  Social Connections: Socially Integrated (02/05/2020)   Social Connection and Isolation Panel [  NHANES]    Frequency of Communication with Friends and Family: More than three times a week    Frequency of Social Gatherings with Friends and Family: Three times a week    Attends Religious Services: More than 4 times per year    Active Member of Clubs or Organizations: Yes    Attends Engineer, structural: More than 4 times per year    Marital Status: Married  Catering manager Violence: Not on file    Family History  Problem Relation Age of Onset   Hypertension Father    Asthma Mother    Diabetes Maternal Aunt    Diabetes Maternal Grandmother    Kidney disease Neg Hx    Bladder Cancer Neg Hx    Breast cancer Neg Hx      Allergies  Allergen Reactions   Azithromycin Nausea And Vomiting, Diarrhea and Other (See Comments)   Adalat Cc [Nifedipine Er]    Amoxicillin-Pot Clavulanate Nausea And Vomiting and Nausea Only    vomiting    Outpatient Medications Prior to Visit  Medication Sig   amLODipine (NORVASC) 10 MG tablet Take 1 tablet (10 mg total) by mouth daily.   buPROPion (WELLBUTRIN XL) 150 MG 24 hr tablet Take 1 tablet (150 mg total) by mouth every morning.   cetirizine (ZYRTEC) 10 MG tablet Take 1 tablet (10 mg total) by mouth daily as needed.   clobetasol cream (TEMOVATE) 0.05 % Apply one gram to aa finger BID PRN. Avoid applying to face, groin, and axilla. Use as directed. Long-term use can cause thinning of the skin.   gabapentin (NEURONTIN) 100 MG capsule Take 1 capsule (100 mg total) by mouth daily.   losartan (COZAAR) 100 MG tablet Take 1 tablet (100 mg total) by mouth daily.   spironolactone (ALDACTONE) 25 MG tablet Take 1 tablet (25 mg total) by mouth daily.   [DISCONTINUED] tirzepatide (ZEPBOUND) 10 MG/0.5ML Pen Inject 10 mg into the skin once a week.   [DISCONTINUED] valACYclovir (VALTREX) 1000 MG tablet Take 1 tablet (1,000 mg total) by mouth daily.   Cholecalciferol (VITAMIN D3 PO) Take by mouth.   medroxyPROGESTERone (PROVERA) 10 MG tablet Take 2 tablets (20 mg total) by mouth in the morning, at noon, and at bedtime. Take 20mg  three times a day for 1 week then 20mg  two times a day for one week, if bleeding continues take 20mg  daily (Patient not taking: Reported on 11/26/2023)   [DISCONTINUED] tranexamic acid (LYSTEDA) 650 MG TABS tablet TAKE 2 TABLETS BY MOUTH THREE TIMES DAILY FOR 14 DAYS   No facility-administered medications prior to visit.    Review of Systems  Constitutional: Negative.   HENT: Negative.    Eyes: Negative.   Respiratory: Negative.  Negative for shortness of breath.   Cardiovascular: Negative.  Negative for chest pain.  Gastrointestinal: Negative.  Negative for  abdominal pain, constipation and diarrhea.  Genitourinary: Negative.   Musculoskeletal:  Negative for joint pain and myalgias.  Skin: Negative.   Neurological: Negative.  Negative for dizziness and headaches.  Endo/Heme/Allergies: Negative.   All other systems reviewed and are negative.      Objective:   BP 126/84 (BP Location: Right Arm, Patient Position: Sitting, Cuff Size: Small)   Pulse 88   Ht 5\' 2"  (1.575 m)   Wt 136 lb (61.7 kg)   SpO2 99%   BMI 24.87 kg/m   Vitals:   01/20/24 0951 01/20/24 1014  BP: 132/88 126/84  Pulse: 88   Height: 5'  2" (1.575 m)   Weight: 136 lb (61.7 kg)   SpO2: 99%   BMI (Calculated): 24.87     Physical Exam Vitals and nursing note reviewed.  Constitutional:      Appearance: Normal appearance. She is normal weight.  HENT:     Head: Normocephalic and atraumatic.     Nose: Nose normal.     Mouth/Throat:     Mouth: Mucous membranes are moist.  Eyes:     Extraocular Movements: Extraocular movements intact.     Conjunctiva/sclera: Conjunctivae normal.     Pupils: Pupils are equal, round, and reactive to light.  Cardiovascular:     Rate and Rhythm: Normal rate and regular rhythm.     Pulses: Normal pulses.     Heart sounds: Normal heart sounds.  Pulmonary:     Effort: Pulmonary effort is normal.     Breath sounds: Normal breath sounds.  Abdominal:     General: Abdomen is flat. Bowel sounds are normal.     Palpations: Abdomen is soft.  Musculoskeletal:        General: Normal range of motion.     Cervical back: Normal range of motion.  Skin:    General: Skin is warm and dry.  Neurological:     General: No focal deficit present.     Mental Status: She is alert and oriented to person, place, and time.  Psychiatric:        Mood and Affect: Mood normal.        Behavior: Behavior normal.        Thought Content: Thought content normal.        Judgment: Judgment normal.      No results found for any visits on 01/20/24.  Recent  Results (from the past 2160 hours)  TSH + free T4     Status: None   Collection Time: 11/15/23  9:30 AM  Result Value Ref Range   TSH 1.010 0.450 - 4.500 uIU/mL   Free T4 1.25 0.82 - 1.77 ng/dL  CBC     Status: None   Collection Time: 11/15/23  9:30 AM  Result Value Ref Range   WBC 5.5 3.4 - 10.8 x10E3/uL   RBC 4.77 3.77 - 5.28 x10E6/uL   Hemoglobin 13.6 11.1 - 15.9 g/dL   Hematocrit 60.4 54.0 - 46.6 %   MCV 85 79 - 97 fL   MCH 28.5 26.6 - 33.0 pg   MCHC 33.5 31.5 - 35.7 g/dL   RDW 98.1 19.1 - 47.8 %   Platelets 340 150 - 450 x10E3/uL  Protime-INR     Status: None   Collection Time: 11/15/23  9:30 AM  Result Value Ref Range   INR 1.0 0.9 - 1.2    Comment: Reference interval is for non-anticoagulated patients. Suggested INR therapeutic range for Vitamin K antagonist therapy:    Standard Dose (moderate intensity                   therapeutic range):       2.0 - 3.0    Higher intensity therapeutic range       2.5 - 3.5    Prothrombin Time 11.1 9.1 - 12.0 sec  Comprehensive metabolic panel     Status: None   Collection Time: 11/15/23  9:30 AM  Result Value Ref Range   Glucose 82 70 - 99 mg/dL   BUN 15 6 - 20 mg/dL   Creatinine, Ser 2.95 0.57 - 1.00 mg/dL  eGFR 112 >59 mL/min/1.73   BUN/Creatinine Ratio 21 9 - 23   Sodium 144 134 - 144 mmol/L   Potassium 4.0 3.5 - 5.2 mmol/L   Chloride 106 96 - 106 mmol/L   CO2 23 20 - 29 mmol/L   Calcium 9.9 8.7 - 10.2 mg/dL   Total Protein 7.0 6.0 - 8.5 g/dL   Albumin 4.8 3.9 - 4.9 g/dL   Globulin, Total 2.2 1.5 - 4.5 g/dL   Bilirubin Total 0.6 0.0 - 1.2 mg/dL   Alkaline Phosphatase 68 44 - 121 IU/L   AST 15 0 - 40 IU/L   ALT 15 0 - 32 IU/L  Cytology - PAP     Status: None   Collection Time: 11/15/23  9:32 AM  Result Value Ref Range   High risk HPV Negative    Adequacy      Satisfactory for evaluation; transformation zone component PRESENT.   Diagnosis      - Negative for intraepithelial lesion or malignancy (NILM)   Comment  Normal Reference Range HPV - Negative   Cervicovaginal ancillary only     Status: None   Collection Time: 11/15/23  9:33 AM  Result Value Ref Range   Neisseria Gonorrhea Negative    Chlamydia Negative    Trichomonas Negative    Bacterial Vaginitis (gardnerella) Negative    Candida Vaginitis Negative    Candida Glabrata Negative    Comment      Normal Reference Range Bacterial Vaginosis - Negative   Comment Normal Reference Range Candida Species - Negative    Comment Normal Reference Range Candida Galbrata - Negative    Comment Normal Reference Range Trichomonas - Negative    Comment Normal Reference Ranger Chlamydia - Negative    Comment      Normal Reference Range Neisseria Gonorrhea - Negative  Hemoglobin A1c     Status: Abnormal   Collection Time: 01/13/24  9:49 AM  Result Value Ref Range   Hgb A1c MFr Bld 4.4 (L) 4.8 - 5.6 %    Comment:          Prediabetes: 5.7 - 6.4          Diabetes: >6.4          Glycemic control for adults with diabetes: <7.0    Est. average glucose Bld gHb Est-mCnc 80 mg/dL  TSH     Status: None   Collection Time: 01/13/24  9:49 AM  Result Value Ref Range   TSH 1.030 0.450 - 4.500 uIU/mL  CMP14+EGFR     Status: None   Collection Time: 01/13/24  9:49 AM  Result Value Ref Range   Glucose 75 70 - 99 mg/dL   BUN 10 6 - 20 mg/dL   Creatinine, Ser 1.61 0.57 - 1.00 mg/dL   eGFR 096 >04 VW/UJW/1.19   BUN/Creatinine Ratio 13 9 - 23   Sodium 142 134 - 144 mmol/L   Potassium 4.5 3.5 - 5.2 mmol/L   Chloride 106 96 - 106 mmol/L   CO2 21 20 - 29 mmol/L   Calcium 9.6 8.7 - 10.2 mg/dL   Total Protein 6.4 6.0 - 8.5 g/dL   Albumin 4.6 3.9 - 4.9 g/dL   Globulin, Total 1.8 1.5 - 4.5 g/dL   Bilirubin Total 0.6 0.0 - 1.2 mg/dL   Alkaline Phosphatase 54 44 - 121 IU/L   AST 13 0 - 40 IU/L   ALT 12 0 - 32 IU/L  Lipid panel     Status:  None   Collection Time: 01/13/24  9:49 AM  Result Value Ref Range   Cholesterol, Total 133 100 - 199 mg/dL   Triglycerides 50 0  - 149 mg/dL   HDL 44 >16 mg/dL   VLDL Cholesterol Cal 11 5 - 40 mg/dL   LDL Chol Calc (NIH) 78 0 - 99 mg/dL   Chol/HDL Ratio 3.0 0.0 - 4.4 ratio    Comment:                                   T. Chol/HDL Ratio                                             Men  Women                               1/2 Avg.Risk  3.4    3.3                                   Avg.Risk  5.0    4.4                                2X Avg.Risk  9.6    7.1                                3X Avg.Risk 23.4   11.0       Assessment & Plan:  Change Zepbound to 7.5 mg Take supplements as directed Increase water intake  Problem List Items Addressed This Visit       Cardiovascular and Mediastinum   Essential hypertension - Primary     Other   Generalized anxiety disorder   Depression    Return in about 3 months (around 04/20/2024).   Total time spent: 25 minutes  [employer], NP  01/20/2024   This document may have been prepared by Dragon Voice Recognition software and as such may include unintentional dictation errors.

## 2024-02-28 ENCOUNTER — Ambulatory Visit: Payer: BC Managed Care – PPO | Admitting: Dermatology

## 2024-02-28 DIAGNOSIS — D225 Melanocytic nevi of trunk: Secondary | ICD-10-CM

## 2024-02-28 DIAGNOSIS — W908XXA Exposure to other nonionizing radiation, initial encounter: Secondary | ICD-10-CM

## 2024-02-28 DIAGNOSIS — L920 Granuloma annulare: Secondary | ICD-10-CM

## 2024-02-28 DIAGNOSIS — D229 Melanocytic nevi, unspecified: Secondary | ICD-10-CM

## 2024-02-28 DIAGNOSIS — D1801 Hemangioma of skin and subcutaneous tissue: Secondary | ICD-10-CM

## 2024-02-28 DIAGNOSIS — Z1283 Encounter for screening for malignant neoplasm of skin: Secondary | ICD-10-CM | POA: Diagnosis not present

## 2024-02-28 DIAGNOSIS — L578 Other skin changes due to chronic exposure to nonionizing radiation: Secondary | ICD-10-CM | POA: Diagnosis not present

## 2024-02-28 DIAGNOSIS — D224 Melanocytic nevi of scalp and neck: Secondary | ICD-10-CM

## 2024-02-28 DIAGNOSIS — L814 Other melanin hyperpigmentation: Secondary | ICD-10-CM

## 2024-02-28 DIAGNOSIS — L853 Xerosis cutis: Secondary | ICD-10-CM

## 2024-02-28 DIAGNOSIS — L821 Other seborrheic keratosis: Secondary | ICD-10-CM

## 2024-02-28 DIAGNOSIS — Z86018 Personal history of other benign neoplasm: Secondary | ICD-10-CM

## 2024-02-28 NOTE — Progress Notes (Signed)
 Follow-Up Visit   Subjective  Sonya Bauer is a 33 y.o. female who presents for the following: Skin Cancer Screening and Full Body Skin Exam  The patient presents for Total-Body Skin Exam (TBSE) for skin cancer screening and mole check. The patient has spots, moles and lesions to be evaluated, some may be new or changing. She has growths on finger that have come up for several years off and on. When she uses clobetasol  cream, areas flare. She has gotten similar areas on the elbows in the past. History of dysplastic nevus.    The following portions of the chart were reviewed this encounter and updated as appropriate: medications, allergies, medical history  Review of Systems:  No other skin or systemic complaints except as noted in HPI or Assessment and Plan.  Objective  Well appearing patient in no apparent distress; mood and affect are within normal limits.  A full examination was performed including scalp, head, eyes, ears, nose, lips, neck, chest, axillae, abdomen, back, buttocks, bilateral upper extremities, bilateral lower extremities, hands, feet, fingers, toes, fingernails, and toenails. All findings within normal limits unless otherwise noted below.   Relevant physical exam findings are noted in the Assessment and Plan.  L 4th PCP, L lat finger Smooth pink firm flat plaque at L 4th PCP, L lat index- pink firm pink scaly papule.  Assessment & Plan   SKIN CANCER SCREENING PERFORMED TODAY.  ACTINIC DAMAGE - Chronic condition, secondary to cumulative UV/sun exposure - diffuse scaly erythematous macules with underlying dyspigmentation - Recommend daily broad spectrum sunscreen SPF 30+ to sun-exposed areas, reapply every 2 hours as needed.  - Staying in the shade or wearing long sleeves, sun glasses (UVA+UVB protection) and wide brim hats (4-inch brim around the entire circumference of the hat) are also recommended for sun protection.  - Call for new or changing  lesions.  LENTIGINES, SEBORRHEIC KERATOSES, HEMANGIOMAS - Benign normal skin lesions - Benign-appearing - Call for any changes  MELANOCYTIC NEVI - Tan-brown and/or pink-flesh-colored symmetric macules and papules, including flesh papule at vertex and occipital scalp - 2.5 mm medium dark brown macule at Left Abdomen  - Benign appearing on exam today. Stable. - Observation - Call clinic for new or changing moles - Recommend daily use of broad spectrum spf 30+ sunscreen to sun-exposed areas.   History of Dysplastic Nevus Back, excised 2017 - No evidence of recurrence today - Recommend regular full body skin exams - Recommend daily broad spectrum sunscreen SPF 30+ to sun-exposed areas, reapply every 2 hours as needed.  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 Condition is bothersome/symptomatic for patient. Currently flared.  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 Its cause is not known, and most episodes of granuloma annulare clear up after a few years, with or without treatment.  Treatment Plan:  Discussed biopsy to confirm diagnosis.  Also discussed ILK injections. Patient defers today.  Continue clobetasol  cream twice daily up to 2 weeks, then decrease to once daily aa prn flares. Avoid applying to face, groin, and axilla. Use as directed. Long-term use can cause thinning of the  skin.  Topical steroids (such as triamcinolone, fluocinolone, fluocinonide, mometasone, clobetasol , halobetasol, betamethasone, hydrocortisone) can cause thinning and lightening of the skin if they are  used for too long in the same area. Your physician has selected the right strength medicine for your problem and area affected on the body. Please use your medication only as directed by your physician to prevent side effects.   Return in about 2 months (around 04/29/2024) for follow-up GA - 1 yr TBSE.  IBernardine Bridegroom, CMA, am acting as scribe for Artemio Larry, MD .   Documentation: I have reviewed the above documentation for accuracy and completeness, and I agree with the above.  Artemio Larry, MD

## 2024-02-28 NOTE — Patient Instructions (Addendum)
 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 Its cause is not known, and most episodes of granuloma annulare clear up after a few years, with or without treatment.   Gentle Skin Care Guide  1. Bathe no more than once a day.  2. Avoid bathing in hot water  3. Use a mild soap like Dove, Vanicream, Cetaphil, CeraVe. Can use Lever 2000 or Cetaphil antibacterial soap  4. Use soap only where you need it. On most days, use it under your arms, between your legs, and on your feet. Let the water rinse other areas unless visibly dirty.  5. When you get out of the bath/shower, use a towel to gently blot your skin dry, don't rub it.  6. While your skin is still a little damp, apply a moisturizing cream such as Vanicream, CeraVe, Cetaphil, Eucerin, Sarna lotion or plain Vaseline Jelly. For hands apply Neutrogena [place] Hand Cream or Excipial Hand Cream.  7. Reapply moisturizer any time you start to itch or feel dry.  8. Sometimes using free and clear laundry detergents can be helpful. Fabric softener sheets should be avoided. Downy Free & Gentle liquid, or any liquid fabric softener that is free of dyes and perfumes, it acceptable to use  9. If your doctor has given you prescription creams you may apply moisturizers over them     Melanoma ABCDEs  Melanoma is the most dangerous type of skin cancer, and is the leading cause of death from skin disease.  You are more likely to develop melanoma if you: Have light-colored skin, light-colored eyes, or red or blond hair Spend a lot of time in the sun Tan regularly, either outdoors or in a tanning bed Have had blistering sunburns, especially during childhood Have a close family member who has had a melanoma Have atypical moles or large birthmarks  Early detection of melanoma is key since treatment is typically  straightforward and cure rates are extremely high if we catch it early.   The first sign of melanoma is often a change in a mole or a new dark spot.  The ABCDE system is a way of remembering the signs of melanoma.  A for asymmetry:  The two halves do not match. B for border:  The edges of the growth are irregular. C for color:  A mixture of colors are present instead of an even brown color. D for diameter:  Melanomas are usually (but not always) greater than 6mm - the size of a pencil eraser. E for evolution:  The spot keeps changing in size, shape, and color.  Please check your skin once per month between visits. You can use a small mirror in front and a large mirror behind you to keep an eye on the back side or your body.   If you see any new or changing lesions before your next follow-up, please call to schedule a visit.  Please continue daily skin protection including broad spectrum sunscreen SPF 30+ to sun-exposed areas, reapplying every 2 hours as needed when you're outdoors.   Staying in the shade or wearing long sleeves, sun glasses (UVA+UVB protection) and wide brim hats (4-inch brim around the entire circumference of the hat) are also recommended for sun protection.     Due to recent changes in healthcare laws, you may see results of your pathology and/or laboratory studies on MyChart before the doctors  have had a chance to review them. We understand that in some cases there may be results that are confusing or concerning to you. Please understand that not all results are received at the same time and often the doctors may need to interpret multiple results in order to provide you with the best plan of care or course of treatment. Therefore, we ask that you please give us  2 business days to thoroughly review all your results before contacting the office for clarification. Should we see a critical lab result, you will be contacted sooner.   If You Need Anything After Your Visit  If  you have any questions or concerns for your doctor, please call our main line at [phone] and press option 4 to reach your doctor's medical assistant. If no one answers, please leave a voicemail as directed and we will return your call as soon as possible. Messages left after 4 pm will be answered the following business day.   You may also send us  a message via MyChart. We typically respond to MyChart messages within 1-2 business days.  For prescription refills, please ask your pharmacy to contact our office. Our fax number is [phone].  If you have an urgent issue when the clinic is closed that cannot wait until the next business day, you can page your doctor at the number below.    Please note that while we do our best to be available for urgent issues outside of office hours, we are not available 24/7.   If you have an urgent issue and are unable to reach us , you may choose to seek medical care at your doctor's office, retail clinic, urgent care center, or emergency room.  If you have a medical emergency, please immediately call 911 or go to the emergency department.  Pager Numbers  - Dr. Bary Likes: [phone]  - Dr. Annette Barters: [phone]  - Dr. Felipe Horton: [phone]   In the event of inclement weather, please call our main line at [phone] for an update on the status of any delays or closures.  Dermatology Medication Tips: Please keep the boxes that topical medications come in in order to help keep track of the instructions about where and how to use these. Pharmacies typically print the medication instructions only on the boxes and not directly on the medication tubes.   If your medication is too expensive, please contact our office at [phone] option 4 or send us  a message through MyChart.   We are unable to tell what your co-pay for medications will be in advance as this is different depending on your insurance coverage. However, we may be able to find a substitute  medication at lower cost or fill out paperwork to get insurance to cover a needed medication.   If a prior authorization is required to get your medication covered by your insurance company, please allow us  1-2 business days to complete this process.  Drug prices often vary depending on where the prescription is filled and some pharmacies may offer cheaper prices.  The website www.goodrx.com contains coupons for medications through different pharmacies. The prices here do not account for what the cost may be with help from insurance (it may be cheaper with your insurance), but the website can give you the price if you did not use any insurance.  - You can print the associated coupon and take it with your prescription to the pharmacy.  - You may also stop by our office during regular business hours and pick  up a GoodRx coupon card.  - If you need your prescription sent electronically to a different pharmacy, notify our office through [facility] or by phone at [phone] option 4.     Si Usted Necesita Algo Despus de Su Visita  Tambin puede enviarnos un mensaje a travs de Clinical cytogeneticist. Por lo general respondemos a los mensajes de MyChart en el transcurso de 1 a 2 das hbiles.  Para renovar recetas, por favor pida a su farmacia que se ponga en contacto con nuestra oficina. Franz Jacks de fax es [place] [phone].  Si tiene un asunto urgente cuando la clnica est cerrada y que no puede esperar hasta el siguiente da hbil, puede llamar/localizar a su doctor(a) al nmero que aparece a continuacin.   Por favor, tenga en cuenta que aunque hacemos todo lo posible para estar disponibles para asuntos urgentes fuera del horario de [place], no estamos disponibles las 24 horas del da, los 7 [address]  [address] de la [place].   Si tiene un problema urgente y no puede comunicarse con nosotros, puede optar por buscar atencin mdica  en el consultorio de su doctor(a), en una clnica privada, en un centro de  atencin urgente o en una sala de emergencias.  Si tiene Engineer, drilling, por favor llame inmediatamente al 911 o vaya a la sala de emergencias.  Nmeros de bper  - Dr. Bary Likes: [phone]  - Dra. Annette Barters: [phone]  - Dr. Felipe Horton: [phone]   En caso de inclemencias del tiempo, por favor llame a Lajuan Pila principal al [phone] para una actualizacin sobre el [place] de cualquier retraso o cierre.  Consejos para la medicacin en dermatologa: Por favor, guarde las cajas en las que vienen los medicamentos de uso tpico para ayudarle a seguir las instrucciones sobre dnde y cmo usarlos. Las farmacias generalmente imprimen las instrucciones del medicamento slo en las cajas y no directamente en los tubos del [place].   Si su medicamento es muy caro, por favor, pngase en contacto con Bettyjane Brunet llamando al [phone] y presione la opcin 4 o envenos un mensaje a travs de Clinical cytogeneticist.   No podemos decirle cul ser su copago por los medicamentos por adelantado ya que esto es diferente dependiendo de la cobertura de su seguro. Sin embargo, es posible que podamos encontrar un medicamento sustituto a Audiological scientist un formulario para que el seguro cubra el medicamento que se considera necesario.   Si se requiere una autorizacin previa para que su compaa de seguros [place] su medicamento, por favor permtanos de 1 a 2 das hbiles para completar este proceso.  Los precios de los medicamentos varan con frecuencia dependiendo del Environmental consultant de dnde se surte la receta y alguna farmacias pueden ofrecer precios ms baratos.  El sitio web www.goodrx.com tiene cupones para medicamentos de Health and safety inspector. Los precios aqu no tienen en cuenta lo que podra costar con la ayuda del seguro (puede ser ms barato con su seguro), pero el sitio web puede darle el precio si no utiliz Tourist information centre manager.  - Puede imprimir el cupn correspondiente y llevarlo con su receta a la  farmacia.  - Tambin puede pasar por nuestra oficina durante el horario de atencin regular y Education officer, museum una tarjeta de cupones de GoodRx.  - Si necesita que su receta se enve electrnicamente a una farmacia diferente, informe a nuestra oficina a travs de MyChart de [place] o por telfono llamando al [phone] y presione la opcin 4.

## 2024-04-07 ENCOUNTER — Ambulatory Visit
Admission: RE | Admit: 2024-04-07 | Discharge: 2024-04-07 | Disposition: A | Discharge status: Home / Self Care | Source: Ambulatory Visit | Attending: Cardiology | Admitting: Cardiology

## 2024-04-07 DIAGNOSIS — E042 Nontoxic multinodular goiter: Secondary | ICD-10-CM | POA: Diagnosis not present

## 2024-04-07 DIAGNOSIS — E041 Nontoxic single thyroid nodule: Secondary | ICD-10-CM | POA: Insufficient documentation

## 2024-04-11 ENCOUNTER — Ambulatory Visit: Payer: Self-pay | Admitting: Cardiology

## 2024-04-11 NOTE — Progress Notes (Signed)
 3 nodules noted on thyroid  ultrasound.  Given the size of the third thyroid  nodule they recommended repeat ultrasound in 1 year.  Results will be forwarded to PCP.

## 2024-04-17 ENCOUNTER — Other Ambulatory Visit: Payer: Self-pay | Admitting: Cardiology

## 2024-04-17 ENCOUNTER — Encounter: Payer: Self-pay | Admitting: Cardiology

## 2024-04-17 MED ORDER — BACLOFEN 5 MG PO TABS
5.0000 mg | ORAL_TABLET | Freq: Every day | ORAL | 1 refills | Status: AC | PRN
Start: 2024-04-17 — End: 2025-04-17

## 2024-05-06 ENCOUNTER — Encounter: Payer: Self-pay | Admitting: Licensed Practical Nurse

## 2024-05-23 ENCOUNTER — Ambulatory Visit: Admitting: Dermatology

## 2024-05-25 ENCOUNTER — Encounter: Payer: Self-pay | Admitting: Cardiology

## 2024-05-25 ENCOUNTER — Ambulatory Visit: Admitting: Cardiology

## 2024-05-25 VITALS — BP 130/86 | HR 85 | Ht 62.0 in | Wt 147.8 lb

## 2024-05-25 DIAGNOSIS — R41840 Attention and concentration deficit: Secondary | ICD-10-CM | POA: Diagnosis not present

## 2024-05-25 DIAGNOSIS — E042 Nontoxic multinodular goiter: Secondary | ICD-10-CM | POA: Diagnosis not present

## 2024-05-25 DIAGNOSIS — R5383 Other fatigue: Secondary | ICD-10-CM

## 2024-05-25 DIAGNOSIS — L659 Nonscarring hair loss, unspecified: Secondary | ICD-10-CM

## 2024-05-25 DIAGNOSIS — Z131 Encounter for screening for diabetes mellitus: Secondary | ICD-10-CM

## 2024-05-25 DIAGNOSIS — I10 Essential (primary) hypertension: Secondary | ICD-10-CM

## 2024-05-25 DIAGNOSIS — Z13220 Encounter for screening for lipoid disorders: Secondary | ICD-10-CM

## 2024-05-25 DIAGNOSIS — Z1329 Encounter for screening for other suspected endocrine disorder: Secondary | ICD-10-CM

## 2024-05-25 MED ORDER — ZEPBOUND 5 MG/0.5ML ~~LOC~~ SOAJ: 5.0000 mg | SUBCUTANEOUS | 4 refills | Status: DC

## 2024-05-25 MED ORDER — HYDROXYZINE HCL 25 MG PO TABS: 25.0000 mg | ORAL_TABLET | Freq: Three times a day (TID) | ORAL | 2 refills | Status: AC | PRN

## 2024-05-25 NOTE — Progress Notes (Signed)
 Established Patient Office Visit  Subjective:  Patient ID: Sonya Bauer, female    DOB: 02-11-1991  Age: 33 y.o. MRN: [id number]  Chief Complaint  Patient presents with   Follow-up    Follow up discuss lack of energy.    Patient in office for follow up. Did not have fasting lab work done. Patient complaining of decreased energy. Patient reports having taken phentermine  in the past to lose weight. During the time she was taking it, she felt she had more energy, was able to focus better. Patient asking to be tested for ADHD. Will send referral.  Patient also complaining of hair loss. Will get fasting lab work.  Continue same medications.    No other concerns at this time.   Past Medical History:  Diagnosis Date   Atypical mole 2017   back, dysplastic nevus, exc   Complication of anesthesia    Depression    Family history of adverse reaction to anesthesia    MOM-HAD SEIZURE DURING SURGERY    GERD (gastroesophageal reflux disease)    EVERY OTHER DAY   Gestational diabetes    Gluten intolerance 07/31/2019   Headache    Heart murmur    ASYMPTOMATIC   Heartburn    Hiatal hernia    History of kidney stones    Hypertension    Kidney stones    Motion sickness    long trips   Pregnancy induced hypertension    Thyroid  cyst 11/16/2014    Past Surgical History:  Procedure Laterality Date   CYSTOSCOPY WITH STENT PLACEMENT Left 10/06/2016   Procedure: CYSTOSCOPY WITH STENT PLACEMENT;  Surgeon: Ricardo Likens, MD;  Location: [facility];  Service: Urology;  Laterality: Left;   TONSILLECTOMY AND ADENOIDECTOMY N/A 05/20/2018   Procedure: TONSILLECTOMY;  Surgeon: Herminio Miu, MD;  Location: [facility];  Service: ENT;  Laterality: N/A;   URETEROSCOPY WITH HOLMIUM LASER LITHOTRIPSY Left 10/06/2016   Procedure: URETEROSCOPY WITH HOLMIUM LASER LITHOTRIPSY;  Surgeon: Ricardo Likens, MD;  Location: [facility];  Service: Urology;  Laterality: Left;   WISDOM TOOTH EXTRACTION      AGE 71    Social History   Socioeconomic History   Marital status: Married    Spouse name: Not on file   Number of children: 2   Years of education: Not on file   Highest education level: Bachelor's degree (e.g., BA, AB, BS)  Occupational History   Occupation: unemployed   Tobacco Use   Smoking status: Never   Smokeless tobacco: Never  Vaping Use   Vaping status: Never Used  Substance and Sexual Activity   Alcohol use: Not Currently    Comment: may have 2-3 drinks 2x/month   Drug use: No   Sexual activity: Yes    Birth control/protection: None  Other Topics Concern   Not on file  Social History Narrative   Patient is married and has two children ages 1yo and 72mo. 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 She reports having a supportive marriage, supportive family relationships, and has a few close friends.    Social Drivers of Corporate investment banker Strain: Low Risk  (05/17/2024)   Received from [facility]   Overall Financial Resource Strain (CARDIA)    Difficulty of Paying Living Expenses: Not very hard  Food Insecurity: No Food Insecurity (05/17/2024)   [facility]   Hunger Vital Sign    Within the past 12 months, you worried that  your food would run out before you got the money to buy more.: Never true    Within the past 12 months, the food you bought just didn't last and you didn't have money to get more.: Never true  Transportation Needs: No Transportation Needs (05/17/2024)   Received from [facility] - Transportation    In the past 12 months, has lack of transportation kept you from medical appointments or from getting medications?: No    Lack of Transportation (Non-Medical): No  Physical Activity: Not on file  Stress: Not on file  Social Connections: Socially Integrated (02/05/2020)   Social Connection and Isolation Panel    Frequency of Communication with Friends and Family: More than  three times a week    Frequency of Social Gatherings with Friends and Family: Three times a week    Attends Religious Services: More than 4 times per year    Active Member of Clubs or Organizations: Yes    Attends Engineer, structural: More than 4 times per year    Marital Status: Married  Catering manager Violence: Not on file    Family History  Problem Relation Age of Onset   Hypertension Father    Asthma Mother    Diabetes Maternal Aunt    Diabetes Maternal Grandmother    Kidney disease Neg Hx    Bladder Cancer Neg Hx    Breast cancer Neg Hx     Allergies  Allergen Reactions   Azithromycin Nausea And Vomiting, Diarrhea and Other (See Comments)   Adalat  Cc [Nifedipine  Er]    Amoxicillin -Pot Clavulanate Nausea And Vomiting and Nausea Only    vomiting    Outpatient Medications Prior to Visit  Medication Sig   amLODipine  (NORVASC ) 10 MG tablet Take 1 tablet (10 mg total) by mouth daily.   baclofen  5 MG TABS Take 1 tablet (5 mg total) by mouth daily as needed for muscle spasms.   Cholecalciferol (VITAMIN D3 PO) Take by mouth.   clobetasol  cream (TEMOVATE ) 0.05 % Apply one gram to aa finger BID PRN. Avoid applying to face, groin, and axilla. Use as directed. Long-term use can cause thinning of the skin.   gabapentin  (NEURONTIN ) 100 MG capsule Take 1 capsule (100 mg total) by mouth daily.   losartan  (COZAAR ) 100 MG tablet Take 1 tablet (100 mg total) by mouth daily.   spironolactone  (ALDACTONE ) 25 MG tablet Take 1 tablet (25 mg total) by mouth daily.   valACYclovir  (VALTREX ) 1000 MG tablet Take 1 tablet (1,000 mg total) by mouth daily.   [DISCONTINUED] cetirizine  (ZYRTEC ) 10 MG tablet Take 1 tablet (10 mg total) by mouth daily as needed.   [DISCONTINUED] tirzepatide  (ZEPBOUND ) 7.5 MG/0.5ML Pen Inject 7.5 mg into the skin once a week.   buPROPion  (WELLBUTRIN  XL) 150 MG 24 hr tablet Take 1 tablet (150 mg total) by mouth every morning. (Patient not taking: Reported on  05/25/2024)   No facility-administered medications prior to visit.    Review of Systems  Constitutional: Negative.   HENT: Negative.    Eyes: Negative.   Respiratory: Negative.  Negative for shortness of breath.   Cardiovascular: Negative.  Negative for chest pain.  Gastrointestinal: Negative.  Negative for abdominal pain, constipation and diarrhea.  Genitourinary: Negative.   Musculoskeletal:  Negative for joint pain and myalgias.  Skin: Negative.   Neurological: Negative.  Negative for dizziness and headaches.  Endo/Heme/Allergies: Negative.   All other systems reviewed and are negative.  Objective:   BP 130/86   Pulse 85   Ht 5' 2 (1.575 m)   Wt 147 lb 12.8 oz (67 kg)   SpO2 99%   BMI 27.03 kg/m   Vitals:   05/25/24 1106  BP: 130/86  Pulse: 85  Height: 5' 2 (1.575 m)  Weight: 147 lb 12.8 oz (67 kg)  SpO2: 99%  BMI (Calculated): 27.03    Physical Exam Vitals and nursing note reviewed.  Constitutional:      Appearance: Normal appearance. She is normal weight.  HENT:     Head: Normocephalic and atraumatic.     Nose: Nose normal.     Mouth/Throat:     Mouth: Mucous membranes are moist.  Eyes:     Extraocular Movements: Extraocular movements intact.     Conjunctiva/sclera: Conjunctivae normal.     Pupils: Pupils are equal, round, and reactive to light.  Cardiovascular:     Rate and Rhythm: Normal rate and regular rhythm.     Pulses: Normal pulses.     Heart sounds: Normal heart sounds.  Pulmonary:     Effort: Pulmonary effort is normal.     Breath sounds: Normal breath sounds.  Abdominal:     General: Abdomen is flat. Bowel sounds are normal.     Palpations: Abdomen is soft.  Musculoskeletal:        General: Normal range of motion.     Cervical back: Normal range of motion.  Skin:    General: Skin is warm and dry.  Neurological:     General: No focal deficit present.     Mental Status: She is alert and oriented to person, place, and time.   Psychiatric:        Mood and Affect: Mood normal.        Behavior: Behavior normal.        Thought Content: Thought content normal.        Judgment: Judgment normal.      No results found for any visits on 05/25/24.  No results found for this or any previous visit (from the past 2160 hours).    Assessment & Plan:  Referral sent for ADHD testing Return for fasting lab work  Problem List Items Addressed This Visit       Cardiovascular and Mediastinum   Essential hypertension     Endocrine   Multinodular goiter   Relevant Orders   TSH   T3, free   T4, Free   Other Visit Diagnoses       Attention deficit    -  Primary   Relevant Orders   Ambulatory referral to Behavioral Health     Thyroid  disorder screening       Relevant Orders   TSH   T3, free   T4, Free     Lipid screening       Relevant Orders   Lipid panel     Diabetes mellitus screening       Relevant Orders   CMP14+EGFR   Hemoglobin A1c     Other fatigue       Relevant Orders   TSH   T3, free   T4, Free   CBC with Differential/Platelet   Testosterone,Free and Total   Vitamin D  (25 hydroxy)   Vitamin B12     Hair loss       Relevant Orders   Testosterone,Free and Total   Vitamin D  (25 hydroxy)   Vitamin B12  Return in about 4 months (around 09/24/2024).   Total time spent: 25 minutes  [employer], NP  05/25/2024   This document may have been prepared by Dragon Voice Recognition software and as such may include unintentional dictation errors.

## 2024-05-26 ENCOUNTER — Ambulatory Visit: Admitting: Cardiology

## 2024-06-15 ENCOUNTER — Emergency Department

## 2024-06-15 ENCOUNTER — Other Ambulatory Visit: Payer: Self-pay

## 2024-06-15 ENCOUNTER — Emergency Department
Admission: EM | Admit: 2024-06-15 | Discharge: 2024-06-15 | Disposition: A | Discharge status: Home / Self Care | Attending: Emergency Medicine | Admitting: Emergency Medicine

## 2024-06-15 DIAGNOSIS — S43014A Anterior dislocation of right humerus, initial encounter: Secondary | ICD-10-CM | POA: Diagnosis not present

## 2024-06-15 DIAGNOSIS — S4991XA Unspecified injury of right shoulder and upper arm, initial encounter: Secondary | ICD-10-CM | POA: Diagnosis present

## 2024-06-15 DIAGNOSIS — M7989 Other specified soft tissue disorders: Secondary | ICD-10-CM | POA: Insufficient documentation

## 2024-06-15 DIAGNOSIS — Y9389 Activity, other specified: Secondary | ICD-10-CM | POA: Diagnosis not present

## 2024-06-15 DIAGNOSIS — X501XXA Overexertion from prolonged static or awkward postures, initial encounter: Secondary | ICD-10-CM | POA: Diagnosis not present

## 2024-06-15 MED ORDER — ACETAMINOPHEN 500 MG PO TABS
1000.0000 mg | ORAL_TABLET | Freq: Once | ORAL | Status: AC
  Administered 2024-06-15: 1000 mg via ORAL
  Filled 2024-06-15: qty 2

## 2024-06-15 MED ORDER — IBUPROFEN 600 MG PO TABS
600.0000 mg | ORAL_TABLET | Freq: Once | ORAL | Status: AC
  Administered 2024-06-15: 600 mg via ORAL
  Filled 2024-06-15: qty 1

## 2024-06-15 NOTE — Discharge Instructions (Addendum)
 Wear the shoulder immobilizer at all times except bathing for the next week and follow up with orthopedics.  When the immobilizer is off, keep the right arm hanging at your side and avoid raising it to chest height or above.

## 2024-06-15 NOTE — ED Provider Notes (Signed)
 [facility] Provider Note    Event Date/Time   First MD Initiated Contact with Patient 06/15/24 [id number]     (approximate)   History   Shoulder Injury   HPI  Sonya Bauer is a 33 y.o. female with a multiple right shoulder dislocations, has seen orthopedics with plan for rehab exercises, who was reaching behind her in the car to hand something to her child when she felt a pop and sudden severe shoulder pain.  She had inability to move the shoulder at the time.  Was stuck in abduction.  On arrival to the ED, getting out of the car she felt the joint shift and improvement in the pain.  She still feels a sense of instability and is worried that it will dislocate again.    Physical Exam   Triage Vital Signs: ED Triage Vitals  Encounter Vitals Group     BP 06/15/24 0922 (!) 172/114     Girls Systolic BP Percentile --      Girls Diastolic BP Percentile --      Boys Systolic BP Percentile --      Boys Diastolic BP Percentile --      Pulse Rate 06/15/24 0922 80     Resp 06/15/24 0922 18     Temp 06/15/24 0922 98.2 F (36.8 C)     Temp src --      SpO2 06/15/24 0922 99 %     Weight 06/15/24 0921 140 lb (63.5 kg)     Height 06/15/24 0921 5' 5 (1.651 m)     Head Circumference --      Peak Flow --      Pain Score 06/15/24 0922 8     Pain Loc --      Pain Education --      Exclude from Growth Chart --     Most recent vital signs: Vitals:   06/15/24 0922 06/15/24 0956  BP: (!) 172/114   Pulse: 80   Resp: 18   Temp: 98.2 F (36.8 C)   SpO2: 99% 100%    General: Awake, no distress.  CV:  Good peripheral perfusion.  Resp:  Normal effort.  Abd:  No distention.  Other:  No deformity of the right shoulder.  No bony point tenderness.  There is general tenderness at the joint capsule anteriorly.  She has apprehension with mild abduction and external rotation.  This is improved with applying posteriorly directed pressure on the anterior aspect of the  joint.   ED Results / Procedures / Treatments   Labs (all labs ordered are listed, but only abnormal results are displayed) Labs Reviewed - No data to display   RADIOLOGY X-ray right shoulder interpreted by me, negative for fracture or dislocation.  Unremarkable.  Radiology report reviewed.   PROCEDURES:  Procedures   MEDICATIONS ORDERED IN ED: Medications  ibuprofen  (ADVIL ) tablet 600 mg (has no administration in time range)  acetaminophen  (TYLENOL ) tablet 1,000 mg (has no administration in time range)     IMPRESSION / MDM / ASSESSMENT AND PLAN / ED COURSE  I reviewed the triage vital signs and the nursing notes.                              Differential diagnosis includes, but is not limited to, shoulder dislocation.  Doubt septic arthritis, fracture, osteomyelitis, ACS, PE, dissection.   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  This spontaneously reduced on her way into the department and she is feeling much better now.  She has intact range of motion.  There is apprehension with shoulder abduction, which is improved by applying posteriorly directed pressure to the anterior shoulder joint.  Will place a shoulder immobilizer, and she will follow-up with her orthopedic surgeon Dr. Tobie [PS]    Clinical Course User Index [PS] Viviann Pastor, MD     FINAL CLINICAL IMPRESSION(S) / ED DIAGNOSES   Final diagnoses:  Anterior dislocation of right shoulder, initial encounter     Rx / DC Orders   ED Discharge Orders     None        Note:  This document was prepared using Dragon voice recognition software and may include unintentional dictation errors.   Viviann Pastor, MD 06/15/24 1024

## 2024-06-15 NOTE — ED Triage Notes (Signed)
 Pt comes with right shoulder. Pt states she was reaching for something over her and heard it pop. Pt has hx of this and normally can put back but not this time.

## 2024-06-16 ENCOUNTER — Other Ambulatory Visit: Payer: Self-pay | Admitting: Orthopedic Surgery

## 2024-06-16 DIAGNOSIS — M24411 Recurrent dislocation, right shoulder: Secondary | ICD-10-CM

## 2024-06-22 ENCOUNTER — Other Ambulatory Visit: Payer: Self-pay | Admitting: Orthopedic Surgery

## 2024-06-22 DIAGNOSIS — M24411 Recurrent dislocation, right shoulder: Secondary | ICD-10-CM

## 2024-06-27 ENCOUNTER — Encounter: Payer: Self-pay | Admitting: Orthopedic Surgery

## 2024-06-27 ENCOUNTER — Other Ambulatory Visit: Payer: Self-pay | Admitting: Cardiology

## 2024-06-27 DIAGNOSIS — R41840 Attention and concentration deficit: Secondary | ICD-10-CM

## 2024-06-28 ENCOUNTER — Ambulatory Visit
Admission: RE | Admit: 2024-06-28 | Discharge: 2024-06-28 | Disposition: A | Discharge status: Home / Self Care | Source: Ambulatory Visit | Attending: Orthopedic Surgery | Admitting: Orthopedic Surgery

## 2024-06-28 DIAGNOSIS — M24411 Recurrent dislocation, right shoulder: Secondary | ICD-10-CM

## 2024-06-28 MED ORDER — IOPAMIDOL (ISOVUE-M 200) INJECTION 41%
10.0000 mL | Freq: Once | INTRAMUSCULAR | Status: AC
Start: 2024-06-28 — End: 2024-06-28
  Administered 2024-06-28: 10 mL via INTRA_ARTICULAR

## 2024-06-29 ENCOUNTER — Other Ambulatory Visit

## 2024-06-30 ENCOUNTER — Ambulatory Visit
Admission: RE | Admit: 2024-06-30 | Discharge: 2024-06-30 | Disposition: A | Discharge status: Home / Self Care | Source: Ambulatory Visit | Attending: Orthopedic Surgery | Admitting: Orthopedic Surgery

## 2024-06-30 DIAGNOSIS — M24411 Recurrent dislocation, right shoulder: Secondary | ICD-10-CM

## 2024-08-03 ENCOUNTER — Encounter: Payer: Self-pay | Admitting: Advanced Practice Midwife

## 2024-08-03 ENCOUNTER — Ambulatory Visit: Admitting: Advanced Practice Midwife

## 2024-08-03 VITALS — BP 136/94 | HR 76 | Ht 65.0 in | Wt 153.7 lb

## 2024-08-03 DIAGNOSIS — Z3202 Encounter for pregnancy test, result negative: Secondary | ICD-10-CM

## 2024-08-03 DIAGNOSIS — Z30011 Encounter for initial prescription of contraceptive pills: Secondary | ICD-10-CM | POA: Diagnosis not present

## 2024-08-03 DIAGNOSIS — N939 Abnormal uterine and vaginal bleeding, unspecified: Secondary | ICD-10-CM | POA: Diagnosis not present

## 2024-08-03 DIAGNOSIS — Z3009 Encounter for other general counseling and advice on contraception: Secondary | ICD-10-CM | POA: Diagnosis not present

## 2024-08-03 LAB — POCT URINE PREGNANCY: Preg Test, Ur: NEGATIVE

## 2024-08-03 MED ORDER — NORETHIN ACE-ETH ESTRAD-FE 1-20 MG-MCG PO TABS: 1.0000 | ORAL_TABLET | Freq: Every day | ORAL | 11 refills | Status: AC

## 2024-08-03 NOTE — Progress Notes (Signed)
 Patient ID: Sonya Bauer, female   DOB: 11-08-1990, 33 y.o.   MRN: [id number]  Reason for Visit: Contraception   Subjective:  HPI:  Sonya Bauer is a 33 y.o. female who presents in office today to discuss oral contraception. She has had ongoing bleeding since having an IAB on 05/16/24. Initially had 4 weeks of moderate bleeding after the medication induced abortion. Then had what she thought was her first period starting 9/15 lasting 1 week followed by daily spotting of brown or pink. Then another period started on October 8 which is still ongoing. She and her husband have been using natural family planning with good success for the past 5 years, however, has taken Plan B  several times without adverse side effects. Most recently she took Plan B  earlier this year and had bleeding that didn't stop- was prescribed progesterone after that. Recently took some of the left over progesterone that she had, which helped temporarily.   Symptoms along with the bleeding include bloating, a feeling of fullness, frequency of urination. Admits increased stress in the past few months including processing her feelings about the abortion (although pregnancy was not desired by her or her husband, she has mixed and sad feelings) and dislocating her shoulder. She quit taking Zepbound  mid September. We discussed the connection between stress and hormone function. Reviewed stress reducing activities. She would like to try OCP for birth/cycle control. Her husband plans a vasectomy.   She does have essential hypertension. BP was mildly elevated today and was not rechecked prior to her departure.   Past Medical History:  Diagnosis Date   Atypical mole 2017   back, dysplastic nevus, exc   Complication of anesthesia    Depression    Family history of adverse reaction to anesthesia    MOM-HAD SEIZURE DURING SURGERY    GERD (gastroesophageal reflux disease)    EVERY OTHER DAY   Gestational diabetes    Gluten  intolerance 07/31/2019   Headache    Heart murmur    ASYMPTOMATIC   Heartburn    Hiatal hernia    History of kidney stones    Hypertension    Kidney stones    Motion sickness    long trips   Pregnancy induced hypertension    Thyroid  cyst 11/16/2014   Family History  Problem Relation Age of Onset   Hypertension Father    Asthma Mother    Diabetes Maternal Aunt    Diabetes Maternal Grandmother    Kidney disease Neg Hx    Bladder Cancer Neg Hx    Breast cancer Neg Hx    Past Surgical History:  Procedure Laterality Date   CYSTOSCOPY WITH STENT PLACEMENT Left 10/06/2016   Procedure: CYSTOSCOPY WITH STENT PLACEMENT;  Surgeon: Ricardo Likens, MD;  Location: [facility];  Service: Urology;  Laterality: Left;   TONSILLECTOMY AND ADENOIDECTOMY N/A 05/20/2018   Procedure: TONSILLECTOMY;  Surgeon: Herminio Miu, MD;  Location: [facility];  Service: ENT;  Laterality: N/A;   URETEROSCOPY WITH HOLMIUM LASER LITHOTRIPSY Left 10/06/2016   Procedure: URETEROSCOPY WITH HOLMIUM LASER LITHOTRIPSY;  Surgeon: Ricardo Likens, MD;  Location: [facility];  Service: Urology;  Laterality: Left;   WISDOM TOOTH EXTRACTION     AGE 41    Short Social History:  Social History   Tobacco Use   Smoking status: Never   Smokeless tobacco: Never  Substance Use Topics   Alcohol use: Not Currently    Comment: may have 2-3 drinks 2x/month  Allergies  Allergen Reactions   Azithromycin Nausea And Vomiting, Diarrhea and Other (See Comments)   Adalat  Cc [Nifedipine  Er]    Amoxicillin -Pot Clavulanate Nausea And Vomiting and Nausea Only    vomiting    Current Outpatient Medications  Medication Sig Dispense Refill   amLODipine  (NORVASC ) 10 MG tablet Take 1 tablet (10 mg total) by mouth daily. 180 tablet 3   baclofen  5 MG TABS Take 1 tablet (5 mg total) by mouth daily as needed for muscle spasms. 30 tablet 1   Cholecalciferol (VITAMIN D3 PO) Take by mouth.     clobetasol  cream (TEMOVATE ) 0.05 % Apply  one gram to aa finger BID PRN. Avoid applying to face, groin, and axilla. Use as directed. Long-term use can cause thinning of the skin. 30 g 0   gabapentin  (NEURONTIN ) 100 MG capsule Take 1 capsule (100 mg total) by mouth daily. 30 capsule 2   hydrOXYzine  (ATARAX ) 25 MG tablet Take 1 tablet (25 mg total) by mouth 3 (three) times daily as needed. 30 tablet 2   losartan  (COZAAR ) 100 MG tablet Take 1 tablet (100 mg total) by mouth daily. 90 tablet 3   spironolactone  (ALDACTONE ) 25 MG tablet Take 1 tablet (25 mg total) by mouth daily. 90 tablet 3   valACYclovir  (VALTREX ) 1000 MG tablet Take 1 tablet (1,000 mg total) by mouth daily. 90 tablet 3   buPROPion  (WELLBUTRIN  XL) 150 MG 24 hr tablet Take 1 tablet (150 mg total) by mouth every morning. (Patient not taking: Reported on 08/03/2024) 90 tablet 1   tirzepatide  (ZEPBOUND ) 5 MG/0.5ML Pen Inject 5 mg into the skin once a week. (Patient not taking: Reported on 08/03/2024) 6 mL 4   No current facility-administered medications for this visit.    Review of Systems  Constitutional:  Negative for chills and fever.  HENT:  Negative for congestion, ear discharge, ear pain, hearing loss, sinus pain and sore throat.   Eyes:  Negative for blurred vision and double vision.  Respiratory:  Negative for cough, shortness of breath and wheezing.   Cardiovascular:  Negative for chest pain, palpitations and leg swelling.  Gastrointestinal:  Negative for abdominal pain, blood in stool, constipation, diarrhea, heartburn, melena, nausea and vomiting.       Positive for abdominal bloating and fullness  Genitourinary:  Positive for frequency. Negative for dysuria, flank pain, hematuria and urgency.  Musculoskeletal:  Negative for back pain, joint pain and myalgias.  Skin:  Negative for itching and rash.  Neurological:  Negative for dizziness, tingling, tremors, sensory change, speech change, focal weakness, seizures, loss of consciousness, weakness and headaches.        Positive for sadness about abortion   Endo/Heme/Allergies:  Negative for environmental allergies. Does not bruise/bleed easily.       Positive for abnormal uterine bleeding  Psychiatric/Behavioral:  Negative for depression, hallucinations, memory loss, substance abuse and suicidal ideas. The patient is not nervous/anxious and does not have insomnia.        Objective:  Objective   Vitals:   08/03/24 1127  BP: (!) 136/94  Pulse: 76  Weight: 153 lb 11.2 oz (69.7 kg)  Height: 5' 5 (1.651 m)   Body mass index is 25.58 kg/m. Constitutional: Well nourished, well developed female in mild acute emotional distress.  HEENT: normal Skin: Warm and dry.  Cardiovascular: Regular rate and rhythm.   Respiratory:  Normal respiratory effort Psych: Alert and Oriented x3. No memory deficits. Normal mood and affect.    Latest  Reference Range & Units 08/03/24 12:00  Preg Test, Ur Negative  Negative    Assessment/Plan:     33 y.o. G3 P2012 with abnormal uterine bleeding, contraception management  Compassionate listening/support provided Rx Junel Follow up as needed   Slater Rains, CNM [facility] 08/03/2024 1:25 PM

## 2024-08-03 NOTE — Patient Instructions (Signed)
 Managing Depression, Adult Depression is a mental health condition that affects your thoughts, feelings, and actions. Being diagnosed with depression can bring you relief if you did not know why you have felt or behaved a certain way. It could also leave you feeling overwhelmed. Finding ways to manage your symptoms can help you feel more positive about your future. How to manage lifestyle changes Being depressed is difficult. Depression can increase the level of everyday stress. Stress can make depression symptoms worse. You may believe your symptoms cannot be managed or will never improve. However, there are many things you can try to help manage your symptoms. There is hope. Managing stress  Stress is your body's reaction to life changes and events, both good and bad. Stress can add to your feelings of depression. Learning to manage your stress can help lessen your feelings of depression. Try some of the following approaches to reducing your stress (stress reduction techniques): Listen to music that you enjoy and that inspires you. Try using a meditation app or take a meditation class. Develop a practice that helps you connect with your spiritual self. Walk in nature, pray, or go to a place of worship. Practice deep breathing. To do this, inhale slowly through your nose. Pause at the top of your inhale for a few seconds and then exhale slowly, letting yourself relax. Repeat this three or four times. Practice yoga to help relax and work your muscles. Choose a stress reduction technique that works for you. These techniques take time and practice to develop. Set aside 5-15 minutes a day to do them. Therapists can offer training in these techniques. Do these things to help manage stress: Keep a journal. Know your limits. Set healthy boundaries for yourself and others, such as saying no when you think something is too much. Pay attention to how you react to certain situations. You may not be able to  control everything, but you can change your reaction. Add humor to your life by watching funny movies or shows. Make time for activities that you enjoy and that relax you. Spend less time using electronics, especially at night before bed. The light from screens can make your brain think it is time to get up rather than go to bed.  Medicines Medicines, such as antidepressants, are often a part of treatment for depression. Talk with your pharmacist or health care provider about all the medicines, supplements, and herbal products that you take, their possible side effects, and what medicines and other products are safe to take together. Make sure to report any side effects you may have to your health care provider. Relationships Your health care provider may suggest family therapy, couples therapy, or individual therapy as part of your treatment. How to recognize changes Everyone responds differently to treatment for depression. As you recover from depression, you may start to: Have more interest in doing activities. Feel more hopeful. Have more energy. Eat a more regular amount of food. Have better mental focus. It is important to recognize if your depression is not getting better or is getting worse. The symptoms you had in the beginning may return, such as: Feeling tired. Eating too much or too little. Sleeping too much or too little. Feeling restless, agitated, or hopeless. Trouble focusing or making decisions. Having unexplained aches and pains. Feeling irritable, angry, or aggressive. If you or your family members notice these symptoms coming back, let your health care provider know right away. Follow these instructions at home: Activity Try to  get some form of exercise each day, such as walking. Try yoga, mindfulness, or other stress reduction techniques. Participate in group activities if you are able. Lifestyle Get enough sleep. Cut down on or stop using caffeine , tobacco,  alcohol, and any other harmful substances. Eat a healthy diet that includes plenty of vegetables, fruits, whole grains, low-fat dairy products, and lean protein. Limit foods that are high in solid fats, added sugar, or salt (sodium). General instructions Take over-the-counter and prescription medicines only as told by your health care provider. Keep all follow-up visits. It is important for your health care provider to check on your mood, behavior, and medicines. Your health care provider may need to make changes to your treatment. Where to find support Talking to others  Friends and family members can be sources of support and guidance. Talk to trusted friends or family members about your condition. Explain your symptoms and let them know that you are working with a health care provider to treat your depression. Tell friends and family how they can help. Finances Find mental health providers that fit with your financial situation. Talk with your health care provider if you are worried about access to food, housing, or medicine. Call your insurance company to learn about your co-pays and prescription plan. Where to find more information You can find support in your area from: Anxiety and Depression Association of America (ADAA): adaa.org Mental Health America: mentalhealthamerica.net The First American on Mental Illness: nami.org Contact a health care provider if: You stop taking your antidepressant medicines, and you have any of these symptoms: Nausea. Headache. Light-headedness. Chills and body aches. Not being able to sleep (insomnia). You or your friends and family think your depression is getting worse. Get help right away if: You have thoughts of hurting yourself or others. Get help right away if you feel like you may hurt yourself or others, or have thoughts about taking your own life. Go to your nearest emergency room or: Call 911. Call the National Suicide Prevention Lifeline at  814-867-2746 or 988. This is open 24 hours a day. Text the Crisis Text Line at (339)747-3036. This information is not intended to replace advice given to you by your health care provider. Make sure you discuss any questions you have with your health care provider. Document Revised: 02/03/2022 Document Reviewed: 02/03/2022 Elsevier Patient Education  2024 Elsevier Inc.Managing Anxiety, Adult After being diagnosed with anxiety, you may be relieved to know why you have felt or behaved a certain way. You may also feel overwhelmed about the treatment ahead and what it will mean for your life. With care and support, you can manage your anxiety. How to manage lifestyle changes Understanding the difference between stress and anxiety Although stress can play a role in anxiety, it is not the same as anxiety. Stress is your body's reaction to life changes and events, both good and bad. Stress is often caused by something external, such as a deadline, test, or competition. It normally goes away after the event has ended and will last just a few hours. But, stress can be ongoing and can lead to more than just stress. Anxiety is caused by something internal, such as imagining a terrible outcome or worrying that something will go wrong that will greatly upset you. Anxiety often does not go away even after the event is over, and it can become a long-term (chronic) worry. Lowering stress and anxiety Talk with your health care provider or a counselor to learn more about lowering  anxiety and stress. They may suggest tension-reduction techniques, such as: Music. Spend time creating or listening to music that you enjoy and that inspires you. Mindfulness-based meditation. Practice being aware of your normal breaths while not trying to control your breathing. It can be done while sitting or walking. Centering prayer. Focus on a word, phrase, or sacred image that means something to you and brings you peace. Deep breathing.  Expand your stomach and inhale slowly through your nose. Hold your breath for 3-5 seconds. Then breathe out slowly, letting your stomach muscles relax. Self-talk. Learn to notice and spot thought patterns that lead to anxiety reactions. Change those patterns to thoughts that feel peaceful. Muscle relaxation. Take time to tense muscles and then relax them. Choose a tension-reduction technique that fits your lifestyle and personality. These techniques take time and practice. Set aside 5-15 minutes a day to do them. Specialized therapists can offer counseling and training in these techniques. The training to help with anxiety may be covered by some insurance plans. Other things you can do to manage stress and anxiety include: Keeping a stress diary. This can help you learn what triggers your reaction and then learn ways to manage your response. Thinking about how you react to certain situations. You may not be able to control everything, but you can control your response. Making time for activities that help you relax and not feeling guilty about spending your time in this way. Doing visual imagery. This involves imagining or creating mental pictures to help you relax. Practicing yoga. Through yoga poses, you can lower tension and relax.  Medicines Medicines for anxiety include: Antidepressant medicines. These are usually prescribed for long-term daily control. Anti-anxiety medicines. These may be added in severe cases, especially when panic attacks occur. When used together, medicines, psychotherapy, and tension-reduction techniques may be the most effective treatment. Relationships Relationships can play a big part in helping you recover. Spend more time connecting with trusted friends and family members. Think about going to couples counseling if you have a partner, taking family education classes, or going to family therapy. Therapy can help you and others better understand your anxiety. How to  recognize changes in your anxiety Everyone responds differently to treatment for anxiety. Recovery from anxiety happens when symptoms lessen and stop interfering with your daily life at home or work. This may mean that you will start to: Have better concentration and focus. Worry will interfere less in your daily thinking. Sleep better. Be less irritable. Have more energy. Have improved memory. Try to recognize when your condition is getting worse. Contact your provider if your symptoms interfere with home or work and you feel like your condition is not improving. Follow these instructions at home: Activity Exercise. Adults should: Exercise for at least 150 minutes each week. The exercise should increase your heart rate and make you sweat (moderate-intensity exercise). Do strengthening exercises at least twice a week. Get the right amount and quality of sleep. Most adults need 7-9 hours of sleep each night. Lifestyle  Eat a healthy diet that includes plenty of vegetables, fruits, whole grains, low-fat dairy products, and lean protein. Do not eat a lot of foods that are high in fats, added sugars, or salt (sodium). Make choices that simplify your life. Do not use any products that contain nicotine or tobacco. These products include cigarettes, chewing tobacco, and vaping devices, such as e-cigarettes. If you need help quitting, ask your provider. Avoid caffeine , alcohol, and certain over-the-counter cold medicines. These may  make you feel worse. Ask your pharmacist which medicines to avoid. General instructions Take over-the-counter and prescription medicines only as told by your provider. Keep all follow-up visits. This is to make sure you are managing your anxiety well or if you need more support. Where to find support You can get help and support from: Self-help groups. Online and [employer]. A trusted spiritual leader. Couples counseling. Family education  classes. Family therapy. Where to find more information You may find that joining a support group helps you deal with your anxiety. The following sources can help you find counselors or support groups near you: Mental Health America: mentalhealthamerica.net Anxiety and Depression Association of [place] (ADAA): adaa.org The First American on Mental Illness (NAMI): nami.org Contact a health care provider if: You have a hard time staying focused or finishing tasks. You spend many hours a day feeling worried about everyday life. You are very tired because you cannot stop worrying. You start to have headaches or often feel tense. You have chronic nausea or diarrhea. Get help right away if: Your heart feels like it is racing. You have shortness of breath. You have thoughts of hurting yourself or others. Get help right away if you feel like you may hurt yourself or others, or have thoughts about taking your own life. Go to your nearest emergency room or: Call 911. Call the National Suicide Prevention Lifeline at 510-666-0077 or 988. This is open 24 hours a day. Text the Crisis Text Line at (914)042-6350. This information is not intended to replace advice given to you by your health care provider. Make sure you discuss any questions you have with your health care provider. Document Revised: 07/07/2022 Document Reviewed: 01/19/2021 Elsevier Patient Education  2024 Elsevier Inc.Managing Stress, Adult Feeling a certain amount of stress is normal. Stress helps our body and mind get ready to deal with the demands of life. Stress hormones can motivate you to do well at work and meet your responsibilities. But severe or long-term (chronic) stress can affect your mental and physical health. Chronic stress puts you at higher risk for: Anxiety and depression. Other health problems such as digestive problems, muscle aches, heart disease, high blood pressure, and stroke. What are the causes? Common causes of  stress include: Demands from work, such as deadlines, feeling overworked, or having long hours. Pressures at home, such as money issues, disagreements with a spouse, or parenting issues. Pressures from major life changes, such as divorce, moving, loss of a loved one, or chronic illness. You may be at higher risk for stress-related problems if you: Do not get enough sleep. Are in poor health. Do not have emotional support. Have a mental health disorder such as anxiety or depression. How to recognize stress Stress can make you: Have trouble sleeping. Feel sad, anxious, irritable, or overwhelmed. Lose your appetite. Overeat or want to eat unhealthy foods. Want to use drugs or alcohol. Stress can also cause physical symptoms, such as: Sore, tense muscles, especially in the shoulders and neck. Headaches. Trouble breathing. A faster heart rate. Stomach pain, nausea, or vomiting. Diarrhea or constipation. Trouble concentrating. Follow these instructions at home: Eating and drinking Eat a healthy diet. This includes: Eating foods that are high in fiber, such as beans, whole grains, and fresh fruits and vegetables. Limiting foods that are high in fat and processed sugars, such as fried or sweet foods. Do not skip meals or overeat. Drink enough fluid to keep your urine pale yellow. Alcohol use Do not drink alcohol  if: Your health care provider tells you not to drink. You are pregnant, may be pregnant, or are planning to become pregnant. Drinking alcohol is a way some people try to ease their stress. This can be dangerous, so if you drink alcohol: Limit how much you have to: 0-1 drink a day for women. 0-2 drinks a day for men. Know how much alcohol is in your drink. In the [place] one drink equals one 12 oz bottle of beer (355 mL), one 5 oz glass of wine (148 mL), or one 1 oz glass of hard liquor (44 mL). Activity  Include 30 minutes of exercise in your daily schedule. Exercise is a  good stress reducer. Include time in your day for an activity that you find relaxing. Try taking a walk, going on a bike ride, reading a book, or listening to music. Schedule your time in a way that lowers stress, and keep a regular schedule. Focus on doing what is most important to get done. Lifestyle Identify the source of your stress and your reaction to it. See a therapist who can help you change unhelpful reactions. When there are stressful events: Talk about them with family, friends, or coworkers. Try to think realistically about stressful events and not ignore them or overreact. Try to find the positives in a stressful situation and not focus on the negatives. Cut back on responsibilities at work and home, if possible. Ask for help from friends or family members if you need it. Find ways to manage stress, such as: Mindfulness, meditation, or deep breathing. Yoga or tai chi. Progressive muscle relaxation. Spending time in nature. Doing art, playing music, or reading. Making time for fun activities. Spending time with family and friends. Get support from family, friends, or spiritual resources. General instructions Get enough sleep. Try to go to sleep and get up at about the same time every day. Take over-the-counter and prescription medicines only as told by your health care provider. Do not use any products that contain nicotine or tobacco. These products include cigarettes, chewing tobacco, and vaping devices, such as e-cigarettes. If you need help quitting, ask your health care provider. Do not use drugs or smoke to deal with stress. Keep all follow-up visits. This is important. Where to find support Talk with your health care provider about stress management or finding a support group. Find a therapist to work with you on your stress management techniques. Where to find more information The First American on Mental Illness: www.nami.org American Psychological Association:  [employer] Contact a health care provider if: Your stress symptoms get worse. You are unable to manage your stress at home. You are struggling to stop using drugs or alcohol. Get help right away if: You may be a danger to yourself or others. You have any thoughts of death or suicide. Get help right awayif you feel like you may hurt yourself or others, or have thoughts about taking your own life. Go to your nearest emergency room or: Call 911. Call the National Suicide Prevention Lifeline at 873 380 5057 or 988 in the [place] This is open 24 hours a day. If you're a Veteran: Call 988 and press 1. This is open 24 hours a day. Text the [employer] at 952-536-6124. Summary Feeling a certain amount of stress is normal, but severe or long-term (chronic) stress can affect your mental and physical health. Chronic stress can put you at higher risk for anxiety, depression, and other health problems such as digestive problems, muscle aches, heart  disease, high blood pressure, and stroke. You may be at higher risk for stress-related problems if you do not get enough sleep, are in poor health, lack emotional support, or have a mental health disorder such as anxiety or depression. Identify the source of your stress and your reaction to it. Try talking about stressful events with family, friends, or coworkers, finding a coping method, or getting support from spiritual resources. If you need more help, talk with your health care provider about finding a support group or a mental health therapist. This information is not intended to replace advice given to you by your health care provider. Make sure you discuss any questions you have with your health care provider. Document Revised: 05/13/2023 Document Reviewed: 04/22/2021 Elsevier Patient Education  2024 [employer].

## 2024-08-24 ENCOUNTER — Ambulatory Visit: Admitting: Certified Nurse Midwife

## 2024-09-12 ENCOUNTER — Other Ambulatory Visit: Payer: Self-pay | Admitting: Orthopedic Surgery

## 2024-09-26 ENCOUNTER — Encounter: Payer: Self-pay | Admitting: Orthopedic Surgery

## 2024-09-27 ENCOUNTER — Inpatient Hospital Stay: Admission: RE | Admit: 2024-09-27

## 2024-09-27 NOTE — Anesthesia Preprocedure Evaluation (Addendum)
 Anesthesia Evaluation    Airway        Dental   Pulmonary           Cardiovascular      Neuro/Psych    GI/Hepatic   Endo/Other    Renal/GU      Musculoskeletal   Abdominal   Peds  Hematology   Anesthesia Other Findings   Reproductive/Obstetrics                              Anesthesia Physical Anesthesia Plan Anesthesia Quick Evaluation

## 2024-09-29 ENCOUNTER — Ambulatory Visit: Admitting: Cardiology

## 2024-10-03 NOTE — Discharge Instructions (Signed)
POLAR CARE INFORMATION  [email or web address]  How to use [facility] Polar Care [place] Cold Therapy System?  YouTube   https://www.youtube.com/watch?[email or web address]  OPERATING INSTRUCTIONS  Start the product With dry hands, connect the transformer to the electrical connection located on the top of the cooler. Next, plug the transformer into an appropriate electrical outlet. The unit will automatically start running at this point.  To stop the pump, disconnect electrical power.  Unplug to stop the product when not in use. Unplugging the Polar Care unit turns it off. Always unplug immediately after use. Never leave it plugged in while unattended. Remove pad.    FIRST ADD WATER TO FILL LINE, THEN ICE---Replace ice when existing ice is almost melted  1 Discuss Treatment with your Licensed Health Care Practitioner and Use Only as Prescribed 2 Apply Insulation Barrier & Cold Therapy Pad 3 Check for Moisture 4 Inspect Skin Regularly  Tips and Trouble Shooting Usage Tips 1. Use cubed or chunked ice for optimal performance. 2. It is recommended to drain the Pad between uses. To drain the pad, hold the Pad upright with the hose pointed toward the ground. Depress the black plunger and allow water to drain out. 3. You may disconnect the Pad from the unit without removing the pad from the affected area by depressing the silver tabs on the hose coupling and gently pulling the hoses apart. The Pad and unit will seal itself and will not leak. Note: Some dripping during release is normal. 4. DO NOT RUN PUMP WITHOUT WATER! The pump in this unit is designed to run with water. Running the unit without water will cause permanent damage to the pump. 5. Unplug unit before removing lid.  TROUBLESHOOTING GUIDE Pump not running, Water not flowing to the pad, Pad is not getting cold 1. Make sure the transformer is plugged into the wall outlet. 2. Confirm that the ice and water are filled to the indicated levels. 3. Make sure  there are no kinks in the pad. 4. Gently pull on the blue tube to make sure the tube/pad junction is straight. 5. Remove the pad from the treatment site and ll it while the pad is lying at; then reapply. 6. Confirm that the pad couplings are securely attached to the unit. Listen for the double clicks (Figure 1) to confirm the pad couplings are securely attached.  Leaks    Note: Some condensation on the lines, controller, and pads is unavoidable, especially in warmer climates. 1. If using a [employer] Polar Care Cold Therapy unit with a detachable Cold Therapy Pad, and a leak exists (other than condensation on the lines) disconnect the pad couplings. Make sure the silver tabs on the couplings are depressed before reconnecting the pad to the pump hose; then confirm both sides of the coupling are properly clicked in. 2. If the coupling continues to leak or a leak is detected in the pad itself, stop using it and call [employer] Customer Care at [phone].  Cleaning After use, empty and dry the unit with a soft cloth. Warm water and mild detergent may be used occasionally to clean the pump and tubes.  WARNING: The Polar Care Cube can be cold enough to cause serious injury, including full skin necrosis. Follow these Operating Instructions, and carefully read the Product Insert (see pouch on side of unit) and the Cold Therapy Pad Fitting Instructions (provided with each Cold Therapy Pad) prior to use.        

## 2024-10-06 ENCOUNTER — Encounter
Admission: RE | Disposition: A | Discharge status: Home / Self Care | Payer: Self-pay | Source: Home / Self Care | Attending: Orthopedic Surgery

## 2024-10-06 ENCOUNTER — Telehealth: Payer: Self-pay

## 2024-10-06 ENCOUNTER — Encounter: Payer: Self-pay | Admitting: Orthopedic Surgery

## 2024-10-06 ENCOUNTER — Ambulatory Visit: Payer: Self-pay | Admitting: Anesthesiology

## 2024-10-06 ENCOUNTER — Ambulatory Visit
Admission: RE | Admit: 2024-10-06 | Discharge: 2024-10-06 | Disposition: A | Discharge status: Home / Self Care | Attending: Orthopedic Surgery | Admitting: Orthopedic Surgery

## 2024-10-06 ENCOUNTER — Other Ambulatory Visit: Payer: Self-pay

## 2024-10-06 DIAGNOSIS — Z539 Procedure and treatment not carried out, unspecified reason: Secondary | ICD-10-CM | POA: Diagnosis not present

## 2024-10-06 DIAGNOSIS — M24411 Recurrent dislocation, right shoulder: Secondary | ICD-10-CM | POA: Insufficient documentation

## 2024-10-06 HISTORY — PX: SHOULDER ARTHROSCOPY WITH LABRAL REPAIR: SHX5691

## 2024-10-06 HISTORY — PX: BICEPT TENODESIS: SHX5116

## 2024-10-06 LAB — POCT PREGNANCY, URINE: Preg Test, Ur: NEGATIVE

## 2024-10-06 SURGERY — ARTHROSCOPY, SHOULDER, WITH GLENOID LABRUM REPAIR
Anesthesia: Choice | Site: Shoulder | Laterality: Right

## 2024-10-06 MED ORDER — SCOPOLAMINE 1 MG/3DAYS TD PT72
MEDICATED_PATCH | TRANSDERMAL | Status: AC
  Filled 2024-10-06: qty 1

## 2024-10-06 MED ORDER — ONDANSETRON HCL 4 MG/2ML IJ SOLN
INTRAMUSCULAR | Status: AC
  Filled 2024-10-06: qty 2

## 2024-10-06 MED ORDER — SCOPOLAMINE 1 MG/3DAYS TD PT72: 1.0000 | MEDICATED_PATCH | TRANSDERMAL | Status: DC

## 2024-10-06 MED ORDER — ONDANSETRON HCL 4 MG/2ML IJ SOLN: 4.0000 mg | Freq: Once | INTRAMUSCULAR | Status: DC

## 2024-10-06 MED ORDER — FENTANYL CITRATE (PF) 100 MCG/2ML IJ SOLN
INTRAMUSCULAR | Status: AC
  Filled 2024-10-06: qty 2

## 2024-10-06 MED ORDER — LACTATED RINGERS IV SOLN: INTRAVENOUS | Status: DC

## 2024-10-06 MED ORDER — CEFAZOLIN SODIUM-DEXTROSE 2-4 GM/100ML-% IV SOLN: 2.0000 g | INTRAVENOUS | Status: DC

## 2024-10-06 MED ORDER — FENTANYL CITRATE (PF) 100 MCG/2ML IJ SOLN: 100.0000 ug | Freq: Once | INTRAMUSCULAR | Status: DC

## 2024-10-06 MED ORDER — MIDAZOLAM HCL (PF) 2 MG/2ML IJ SOLN: 2.0000 mg | INTRAMUSCULAR | Status: DC | PRN

## 2024-10-06 MED ORDER — MIDAZOLAM HCL 2 MG/2ML IJ SOLN
INTRAMUSCULAR | Status: AC
  Filled 2024-10-06: qty 2

## 2024-10-06 MED ORDER — CEFAZOLIN SODIUM-DEXTROSE 2-3 GM-%(50ML) IV SOLR
INTRAVENOUS | Status: AC
  Filled 2024-10-06: qty 50

## 2024-10-06 SURGICAL SUPPLY — 42 items
ADAPTER IRRIG TUBE 2 SPIKE SOL (ADAPTER) ×2 IMPLANT
BLADE SHAVER 4.5X7 STR FR (MISCELLANEOUS) ×1 IMPLANT
BUR BR 5.5 WIDE MOUTH (BURR) IMPLANT
CANNULA PART THRD DISP 5.75X7 (CANNULA) ×2 IMPLANT
CANNULA PARTIAL THREAD 2X7 (CANNULA) ×1 IMPLANT
CANNULA TWIST IN 8.25X9CM (CANNULA) IMPLANT
CHLORAPREP W/TINT 26 (MISCELLANEOUS) ×1 IMPLANT
COOLER ICEMAN CLASSIC (MISCELLANEOUS) ×1 IMPLANT
COVER LIGHT HANDLE STERIS (MISCELLANEOUS) ×1 IMPLANT
DRAPE INCISE IOBAN 66X45 STRL (DRAPES) ×1 IMPLANT
DRAPE SHEET LG 3/4 BI-LAMINATE (DRAPES) ×1 IMPLANT
DRAPE U-SHAPE 47X51 STRL (DRAPES) ×1 IMPLANT
ELECTRODE REM PT RTRN 9FT ADLT (ELECTROSURGICAL) IMPLANT
GAUZE 4X4 16PLY ~~LOC~~+RFID DBL (SPONGE) ×1 IMPLANT
GAUZE SPONGE 4X4 12PLY STRL (GAUZE/BANDAGES/DRESSINGS) ×1 IMPLANT
GAUZE XEROFORM 1X8 LF (GAUZE/BANDAGES/DRESSINGS) ×1 IMPLANT
GLOVE BIOGEL PI IND STRL 8 (GLOVE) ×1 IMPLANT
GLOVE SURG SYN 8.0 PF PI (GLOVE) ×1 IMPLANT
GOWN SRG XL LONG LVL 3 NONREIN (GOWNS) ×2 IMPLANT
IV LR IRRIG 3000ML ARTHROMATIC (IV SOLUTION) ×20 IMPLANT
KIT STABILIZATION SHOULDER (MISCELLANEOUS) ×1 IMPLANT
KIT SUTURETAK 3.0 INSERT PERC (KITS) IMPLANT
KIT TURNOVER KIT A (KITS) ×1 IMPLANT
MANIFOLD NEPTUNE II (INSTRUMENTS) ×2 IMPLANT
MASK FACE SPIDER DISP (MASK) ×1 IMPLANT
MAT ABSORB FLUID 56X50 GRAY (MISCELLANEOUS) ×2 IMPLANT
PACK ARTHROSCOPY SHOULDER (MISCELLANEOUS) ×1 IMPLANT
PAD ABD DERMACEA PRESS 5X9 (GAUZE/BANDAGES/DRESSINGS) ×1 IMPLANT
PAD COLD SHLDR SM WRAP-ON (PAD) ×1 IMPLANT
PASSER SUT FIRSTPASS SELF (INSTRUMENTS) ×1 IMPLANT
SLING ULTRA II M (MISCELLANEOUS) IMPLANT
SOLN STERILE WATER 500 ML (IV SOLUTION) ×1 IMPLANT
SPONGE T-LAP 18X18 ~~LOC~~+RFID (SPONGE) ×1 IMPLANT
SUT ETH BLK MONO 3 0 FS 1 12/B (SUTURE) IMPLANT
SUT LASSO 90 DEG SD STR (SUTURE) IMPLANT
SUTURE MNCRL 4-0 27XMF (SUTURE) IMPLANT
TAPE MICROFOAM 4IN (TAPE) ×1 IMPLANT
TRAP FLUID SMOKE EVACUATOR (MISCELLANEOUS) ×1 IMPLANT
TUBE SET DOUBLEFLO INFLOW (TUBING) ×1 IMPLANT
TUBE SET DOUBLEFLO OUTFLOW (TUBING) ×1 IMPLANT
TUBING CONNECTING 10 (TUBING) ×1 IMPLANT
WAND WEREWOLF FLOW 90D (MISCELLANEOUS) IMPLANT

## 2024-10-06 NOTE — Telephone Encounter (Signed)
 Pt was scheduled for surgery this morning however pt's BP was too high so pt did not have surgery. Pt is wondering if it is because she stopped taking Wellbutrin . Pt is wondering if she should go back on it/what you recommend. Please advise.

## 2024-10-06 NOTE — Progress Notes (Signed)
 Unfortunately, patient has been busy and forgot to take BP meds for last 2-3 days. She did take Amlodipine  today.  BP has ranged from 190/111 to 179/124 and cannot safely proceed today with general anesthesia and surgery.  Note that patient is also very nervous and my nerves are torn up.   Discussed w/Dr. Tobie, with patient and her mother.  Patient reports this is normal for me; I have really high blood pressure even with medicine.   Patient does not wish to go to ER. She will take her BP meds, and also be more calm and not having nervousness elevate her BP today.  She will discuss with her primary care physician and try to optimize BP preop, and perhaps may also take anxiolytic.    Patient understandably very sad about not having surgery today.  Attempted to help console patient.

## 2024-10-06 NOTE — Progress Notes (Signed)
Canceled per anesthesia due to high BP

## 2024-10-06 NOTE — H&P (Signed)
 Surgery cancelled due to significant hypertension. Patient on 3 anti-hypertensive medications, but has not been taking 2 of them for the past few days. BP range between 179-190/106-124 x 3 measurements. Given this, anesthesia team recommended rescheduling surgery to allow for blood pressure optimization. Our offices will call patient to reschedule surgery.

## 2024-10-09 ENCOUNTER — Encounter: Payer: Self-pay | Admitting: Orthopedic Surgery

## 2024-10-10 ENCOUNTER — Ambulatory Visit: Admitting: Cardiology

## 2024-10-10 ENCOUNTER — Encounter: Payer: Self-pay | Admitting: Cardiology

## 2024-10-10 VITALS — BP 128/88 | HR 71 | Ht 65.0 in | Wt 160.4 lb

## 2024-10-10 DIAGNOSIS — R5383 Other fatigue: Secondary | ICD-10-CM

## 2024-10-10 DIAGNOSIS — E042 Nontoxic multinodular goiter: Secondary | ICD-10-CM

## 2024-10-10 DIAGNOSIS — I10 Essential (primary) hypertension: Secondary | ICD-10-CM | POA: Diagnosis not present

## 2024-10-10 DIAGNOSIS — Z131 Encounter for screening for diabetes mellitus: Secondary | ICD-10-CM

## 2024-10-10 DIAGNOSIS — F331 Major depressive disorder, recurrent, moderate: Secondary | ICD-10-CM | POA: Diagnosis not present

## 2024-10-10 DIAGNOSIS — F411 Generalized anxiety disorder: Secondary | ICD-10-CM

## 2024-10-10 DIAGNOSIS — L659 Nonscarring hair loss, unspecified: Secondary | ICD-10-CM | POA: Diagnosis not present

## 2024-10-10 DIAGNOSIS — Z1329 Encounter for screening for other suspected endocrine disorder: Secondary | ICD-10-CM

## 2024-10-10 DIAGNOSIS — Z13220 Encounter for screening for lipoid disorders: Secondary | ICD-10-CM

## 2024-10-10 MED ORDER — BUSPIRONE HCL 5 MG PO TABS: 5.0000 mg | ORAL_TABLET | Freq: Three times a day (TID) | ORAL | 1 refills | Status: AC

## 2024-10-10 NOTE — Progress Notes (Unsigned)
 "  Established Patient Office Visit  Subjective:  Patient ID: Sonya Bauer, female    DOB: Mar 11, 1991  Age: 33 y.o. MRN: [id number]  Chief Complaint  Patient presents with   Acute Visit    High BP needs meds changed    Patient in office for an acute visit, patient reports elevated blood pressure at home. Patient was scheduled for surgery on 10/06/24, surgery was cancelled due to elevated blood pressure. Patient reports she was told at pre-op appointment to hold two of her blood pressure medications. Blood pressure improved in office on recheck. Recommend taking blood pressure medications as prescribed. Will send in buspirone to take as needed for anxiety.  Due for fasting lab work, will do today.  Up to date on maintenance exams.  Continue current medications.     No other concerns at this time.   Past Medical History:  Diagnosis Date   Atypical mole 2017   back, dysplastic nevus, exc   Depression    Family history of adverse reaction to anesthesia    MOM-HAD SEIZURE DURING SURGERY DURING AN INJECTION, NO SEIZURES SINCE WITH ANESTHESIA   GERD (gastroesophageal reflux disease)    EVERY OTHER DAY   Gestational diabetes    Gluten intolerance 07/31/2019   Headache    Heart murmur    ASYMPTOMATIC   Heartburn    Hiatal hernia    History of kidney stones    Hypertension    Kidney stones    Motion sickness    long trips   Pregnancy induced hypertension    Thyroid  cyst 11/16/2014    Past Surgical History:  Procedure Laterality Date   BICEPT TENODESIS Right 10/06/2024   Procedure: TENODESIS, BICEPS;  Surgeon: Tobie Priest, MD;  Location: [facility];  Service: Orthopedics;  Laterality: Right;  Right shoulder circumferential labral repair with Remplissage procedure, possible biceps tenodesis   CYSTOSCOPY WITH STENT PLACEMENT Left 10/06/2016   Procedure: CYSTOSCOPY WITH STENT PLACEMENT;  Surgeon: Ricardo Likens, MD;  Location: [facility];  Service: Urology;   Laterality: Left;   SHOULDER ARTHROSCOPY WITH LABRAL REPAIR Right 10/06/2024   Procedure: ARTHROSCOPY, SHOULDER, WITH GLENOID LABRUM REPAIR;  Surgeon: Tobie Priest, MD;  Location: [facility];  Service: Orthopedics;  Laterality: Right;  Right shoulder circumferential labral repair with Remplissage procedure, possible biceps tenodesis   TONSILLECTOMY AND ADENOIDECTOMY N/A 05/20/2018   Procedure: TONSILLECTOMY;  Surgeon: Herminio Miu, MD;  Location: [facility];  Service: ENT;  Laterality: N/A;   URETEROSCOPY WITH HOLMIUM LASER LITHOTRIPSY Left 10/06/2016   Procedure: URETEROSCOPY WITH HOLMIUM LASER LITHOTRIPSY;  Surgeon: Ricardo Likens, MD;  Location: [facility];  Service: Urology;  Laterality: Left;   WISDOM TOOTH EXTRACTION     AGE 13    Social History   Socioeconomic History   Marital status: Married    Spouse name: Not on file   Number of children: 2   Years of education: Not on file   Highest education level: Bachelor's degree (e.g., BA, AB, BS)  Occupational History   Occupation: unemployed   Tobacco Use   Smoking status: Never   Smokeless tobacco: Never  Vaping Use   Vaping status: Never Used  Substance and Sexual Activity   Alcohol use: Not Currently    Comment: may have 2-3 drinks 2x/month   Drug use: No   Sexual activity: Yes    Birth control/protection: None  Other Topics Concern   Not on file  Social History Narrative   Patient is married  and has two children ages 62yo and 61mo. 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 She reports having a supportive marriage, supportive family relationships, and has a few close friends.    Social Drivers of Health   Tobacco Use: Low Risk (10/13/2024)   Patient History    Smoking Tobacco Use: Never    Smokeless Tobacco Use: Never    Passive Exposure: Not on file  Financial Resource Strain: Low Risk  (09/21/2024)   Received from [facility]   Overall Financial Resource Strain (CARDIA)    Difficulty  of Paying Living Expenses: Not very hard  Food Insecurity: No Food Insecurity (09/21/2024)   Received from [facility]   Epic    Within the past 12 months, you worried that your food would run out before you got the money to buy more.: Never true    Within the past 12 months, the food you bought just didn't last and you didn't have money to get more.: Never true  Transportation Needs: No Transportation Needs (09/21/2024)   Received from [facility] - Transportation    In the past 12 months, has lack of transportation kept you from medical appointments or from getting medications?: No    Lack of Transportation (Non-Medical): No  Physical Activity: Not on file  Stress: Not on file  Social Connections: Not on file  Intimate Partner Violence: Not on file  Depression (EYV7-0): Not on file  Alcohol Screen: Not on file  Housing: Low Risk  (09/21/2024)   Received from [facility]   Epic    In the last 12 months, was there a time when you were not able to pay the mortgage or rent on time?: No    In the past 12 months, how many times have you moved where you were living?: 0    At any time in the past 12 months, were you homeless or living in a shelter (including now)?: No  Utilities: Not At Risk (09/21/2024)   Received from [facility]   Epic    In the past 12 months has the electric, gas, oil, or water company threatened to shut off services in your home?: No  Health Literacy: Not on file    Family History  Problem Relation Age of Onset   Hypertension Father    Asthma Mother    Diabetes Maternal Aunt    Diabetes Maternal Grandmother    Kidney disease Neg Hx    Bladder Cancer Neg Hx    Breast cancer Neg Hx     Allergies[1]  Show/hide medication list[2]  Review of Systems  Constitutional: Negative.   HENT: Negative.    Eyes: Negative.   Respiratory: Negative.  Negative for shortness of breath.    Cardiovascular: Negative.  Negative for chest pain.  Gastrointestinal: Negative.  Negative for abdominal pain, constipation and diarrhea.  Genitourinary: Negative.   Musculoskeletal:  Negative for joint pain and myalgias.  Skin: Negative.   Neurological: Negative.  Negative for dizziness and headaches.  Endo/Heme/Allergies: Negative.   All other systems reviewed and are negative.      Objective:   BP 128/88 (BP Location: Right Arm, Patient Position: Sitting, Cuff Size: Large)   Pulse 71   Ht 5' 5 (1.651 m)   Wt 160 lb 6.4 oz (72.8 kg)   LMP 09/25/2024 (Exact Date)   SpO2 94%   BMI 26.69 kg/m   Vitals:  10/10/24 0904 10/10/24 0923  BP: (!) 137/94 128/88  Pulse: 71   Height: 5' 5 (1.651 m)   Weight: 160 lb 6.4 oz (72.8 kg)   SpO2: 94%   BMI (Calculated): 26.69     Physical Exam Vitals and nursing note reviewed.  Constitutional:      Appearance: Normal appearance. She is normal weight.  HENT:     Head: Normocephalic and atraumatic.     Nose: Nose normal.     Mouth/Throat:     Mouth: Mucous membranes are moist.  Eyes:     Extraocular Movements: Extraocular movements intact.     Conjunctiva/sclera: Conjunctivae normal.     Pupils: Pupils are equal, round, and reactive to light.  Cardiovascular:     Rate and Rhythm: Normal rate and regular rhythm.     Pulses: Normal pulses.     Heart sounds: Normal heart sounds.  Pulmonary:     Effort: Pulmonary effort is normal.     Breath sounds: Normal breath sounds.  Abdominal:     General: Abdomen is flat. Bowel sounds are normal.     Palpations: Abdomen is soft.  Musculoskeletal:        General: Normal range of motion.     Cervical back: Normal range of motion.  Skin:    General: Skin is warm and dry.  Neurological:     General: No focal deficit present.     Mental Status: She is alert and oriented to person, place, and time.  Psychiatric:        Mood and Affect: Mood normal.        Behavior: Behavior normal.         Thought Content: Thought content normal.        Judgment: Judgment normal.      Results for orders placed or performed in visit on 10/10/24  Vitamin B12  Result Value Ref Range   Vitamin B-12 505 232 - 1,245 pg/mL  Vitamin D  (25 hydroxy)  Result Value Ref Range   Vit D, 25-Hydroxy 24.6 (L) 30.0 - 100.0 ng/mL  Testosterone,Free and Total  Result Value Ref Range   Testosterone 19 8 - 60 ng/dL   Testosterone, Free 1.6 0.0 - 4.2 pg/mL  CBC with Differential/Platelet  Result Value Ref Range   WBC 5.3 3.4 - 10.8 x10E3/uL   RBC 4.83 3.77 - 5.28 x10E6/uL   Hemoglobin 14.1 11.1 - 15.9 g/dL   Hematocrit 57.2 65.9 - 46.6 %   MCV 88 79 - 97 fL   MCH 29.2 26.6 - 33.0 pg   MCHC 33.0 31.5 - 35.7 g/dL   RDW 86.4 88.2 - 84.5 %   Platelets 333 150 - 450 x10E3/uL   Neutrophils 54 Not Estab. %   Lymphs 34 Not Estab. %   Monocytes 7 Not Estab. %   Eos 4 Not Estab. %   Basos 1 Not Estab. %   Neutrophils Absolute 2.8 1.4 - 7.0 x10E3/uL   Lymphocytes Absolute 1.8 0.7 - 3.1 x10E3/uL   Monocytes Absolute 0.4 0.1 - 0.9 x10E3/uL   EOS (ABSOLUTE) 0.2 0.0 - 0.4 x10E3/uL   Basophils Absolute 0.1 0.0 - 0.2 x10E3/uL   Immature Granulocytes 0 Not Estab. %   Immature Grans (Abs) 0.0 0.0 - 0.1 x10E3/uL  T4, Free  Result Value Ref Range   Free T4 1.19 0.82 - 1.77 ng/dL  T3, free  Result Value Ref Range   T3, Free 3.1 2.0 - 4.4 pg/mL  TSH  Result Value Ref Range   TSH 1.820 0.450 - 4.500 uIU/mL  Hemoglobin A1c  Result Value Ref Range   Hgb A1c MFr Bld 4.7 (L) 4.8 - 5.6 %   Est. average glucose Bld gHb Est-mCnc 88 mg/dL  Lipid panel  Result Value Ref Range   Cholesterol, Total 188 100 - 199 mg/dL   Triglycerides 91 0 - 149 mg/dL   HDL 65 >60 mg/dL   VLDL Cholesterol Cal 16 5 - 40 mg/dL   LDL Chol Calc (NIH) 892 (H) 0 - 99 mg/dL   Chol/HDL Ratio 2.9 0.0 - 4.4 ratio  CMP14+EGFR  Result Value Ref Range   Glucose 83 70 - 99 mg/dL   BUN 14 6 - 20 mg/dL   Creatinine, Ser 9.31 0.57 - 1.00  mg/dL   eGFR 881 >40 fO/fpw/8.26   BUN/Creatinine Ratio 21 9 - 23   Sodium 141 134 - 144 mmol/L   Potassium 4.2 3.5 - 5.2 mmol/L   Chloride 104 96 - 106 mmol/L   CO2 23 20 - 29 mmol/L   Calcium  9.7 8.7 - 10.2 mg/dL   Total Protein 6.7 6.0 - 8.5 g/dL   Albumin 4.5 3.9 - 4.9 g/dL   Globulin, Total 2.2 1.5 - 4.5 g/dL   Bilirubin Total 0.6 0.0 - 1.2 mg/dL   Alkaline Phosphatase 74 41 - 116 IU/L   AST 13 0 - 40 IU/L   ALT 23 0 - 32 IU/L    Recent Results (from the past 2160 hours)  POCT urine pregnancy     Status: Normal   Collection Time: 08/03/24 12:00 PM  Result Value Ref Range   Preg Test, Ur Negative Negative  Pregnancy, urine POC     Status: None   Collection Time: 10/06/24  6:51 AM  Result Value Ref Range   Preg Test, Ur NEGATIVE NEGATIVE    Comment:        THE SENSITIVITY OF THIS METHODOLOGY IS >20 mIU/mL.   Vitamin B12     Status: None   Collection Time: 10/10/24  9:32 AM  Result Value Ref Range   Vitamin B-12 505 232 - 1,245 pg/mL  Vitamin D  (25 hydroxy)     Status: Abnormal   Collection Time: 10/10/24  9:32 AM  Result Value Ref Range   Vit D, 25-Hydroxy 24.6 (L) 30.0 - 100.0 ng/mL    Comment: Vitamin D  deficiency has been defined by the Institute of Medicine and an Endocrine Society practice guideline as a level of serum 25-OH vitamin D  less than 20 ng/mL (1,2). The Endocrine Society went on to further define vitamin D  insufficiency as a level between 21 and 29 ng/mL (2). 1. IOM (Institute of Medicine). 2010. Dietary reference    intakes for calcium  and D. [place]: The    [employer]. 2. Holick MF, Binkley Crook, Bischoff-Ferrari HA, et al.    Evaluation, treatment, and prevention of vitamin D     deficiency: an Endocrine Society clinical practice    guideline. JCEM. 2011 Jul; 96(7):1911-30.   Testosterone,Free and Total     Status: None   Collection Time: 10/10/24  9:32 AM  Result Value Ref Range   Testosterone 19 8 - 60 ng/dL    Testosterone, Free 1.6 0.0 - 4.2 pg/mL  CBC with Differential/Platelet     Status: None   Collection Time: 10/10/24  9:32 AM  Result Value Ref Range   WBC 5.3 3.4 - 10.8 x10E3/uL   RBC 4.83 3.77 -  5.28 x10E6/uL   Hemoglobin 14.1 11.1 - 15.9 g/dL   Hematocrit 57.2 65.9 - 46.6 %   MCV 88 79 - 97 fL   MCH 29.2 26.6 - 33.0 pg   MCHC 33.0 31.5 - 35.7 g/dL   RDW 86.4 88.2 - 84.5 %   Platelets 333 150 - 450 x10E3/uL   Neutrophils 54 Not Estab. %   Lymphs 34 Not Estab. %   Monocytes 7 Not Estab. %   Eos 4 Not Estab. %   Basos 1 Not Estab. %   Neutrophils Absolute 2.8 1.4 - 7.0 x10E3/uL   Lymphocytes Absolute 1.8 0.7 - 3.1 x10E3/uL   Monocytes Absolute 0.4 0.1 - 0.9 x10E3/uL   EOS (ABSOLUTE) 0.2 0.0 - 0.4 x10E3/uL   Basophils Absolute 0.1 0.0 - 0.2 x10E3/uL   Immature Granulocytes 0 Not Estab. %   Immature Grans (Abs) 0.0 0.0 - 0.1 x10E3/uL  T4, Free     Status: None   Collection Time: 10/10/24  9:32 AM  Result Value Ref Range   Free T4 1.19 0.82 - 1.77 ng/dL  T3, free     Status: None   Collection Time: 10/10/24  9:32 AM  Result Value Ref Range   T3, Free 3.1 2.0 - 4.4 pg/mL  TSH     Status: None   Collection Time: 10/10/24  9:32 AM  Result Value Ref Range   TSH 1.820 0.450 - 4.500 uIU/mL  Hemoglobin A1c     Status: Abnormal   Collection Time: 10/10/24  9:32 AM  Result Value Ref Range   Hgb A1c MFr Bld 4.7 (L) 4.8 - 5.6 %    Comment:          Prediabetes: 5.7 - 6.4          Diabetes: >6.4          Glycemic control for adults with diabetes: <7.0    Est. average glucose Bld gHb Est-mCnc 88 mg/dL  Lipid panel     Status: Abnormal   Collection Time: 10/10/24  9:32 AM  Result Value Ref Range   Cholesterol, Total 188 100 - 199 mg/dL   Triglycerides 91 0 - 149 mg/dL   HDL 65 >60 mg/dL   VLDL Cholesterol Cal 16 5 - 40 mg/dL   LDL Chol Calc (NIH) 892 (H) 0 - 99 mg/dL   Chol/HDL Ratio 2.9 0.0 - 4.4 ratio    Comment:                                   T. Chol/HDL Ratio                                              Men  Women                               1/2 Avg.Risk  3.4    3.3                                   Avg.Risk  5.0    4.4  2X Avg.Risk  9.6    7.1                                3X Avg.Risk 23.4   11.0   CMP14+EGFR     Status: None   Collection Time: 10/10/24  9:32 AM  Result Value Ref Range   Glucose 83 70 - 99 mg/dL   BUN 14 6 - 20 mg/dL   Creatinine, Ser 9.31 0.57 - 1.00 mg/dL   eGFR 881 >40 fO/fpw/8.26   BUN/Creatinine Ratio 21 9 - 23   Sodium 141 134 - 144 mmol/L   Potassium 4.2 3.5 - 5.2 mmol/L   Chloride 104 96 - 106 mmol/L   CO2 23 20 - 29 mmol/L   Calcium  9.7 8.7 - 10.2 mg/dL   Total Protein 6.7 6.0 - 8.5 g/dL   Albumin 4.5 3.9 - 4.9 g/dL   Globulin, Total 2.2 1.5 - 4.5 g/dL   Bilirubin Total 0.6 0.0 - 1.2 mg/dL   Alkaline Phosphatase 74 41 - 116 IU/L   AST 13 0 - 40 IU/L   ALT 23 0 - 32 IU/L      Assessment & Plan:  Take blood pressure medications as prescribed Buspar for anxiety Continue current medications  Problem List Items Addressed This Visit       Cardiovascular and Mediastinum   Essential hypertension - Primary     Endocrine   Multinodular goiter     Other   Generalized anxiety disorder   Relevant Medications   busPIRone (BUSPAR) 5 MG tablet   Depression   Relevant Medications   busPIRone (BUSPAR) 5 MG tablet   Other Visit Diagnoses       Other fatigue         Hair loss         Thyroid  disorder screening         Diabetes mellitus screening         Lipid screening           Return in about 4 months (around 02/08/2025) for fasting lab work prior.   Total time spent: 25 minutes. This time includes review of previous notes and results and patient face to face interaction during today's visit.    Jeoffrey Pollen, NP  10/10/2024   This document may have been prepared by Dragon Voice Recognition software and as such may include unintentional dictation errors.      [1]   Allergies Allergen Reactions   Azithromycin Nausea And Vomiting, Diarrhea and Other (See Comments)   Adalat  Cc [Nifedipine  Er]    Amoxicillin -Pot Clavulanate Nausea And Vomiting and Nausea Only    vomiting  [2]  Outpatient Medications Prior to Visit  Medication Sig   amLODipine  (NORVASC ) 10 MG tablet Take 1 tablet (10 mg total) by mouth daily.   Ascorbic Acid (VITAMIN C) 1000 MG tablet Take 1,000 mg by mouth daily.   baclofen  5 MG TABS Take 1 tablet (5 mg total) by mouth daily as needed for muscle spasms.   cetirizine  (ZYRTEC ) 10 MG chewable tablet Chew 10 mg by mouth daily.   Cholecalciferol (VITAMIN D3 PO) Take by mouth.   clobetasol  cream (TEMOVATE ) 0.05 % Apply one gram to aa finger BID PRN. Avoid applying to face, groin, and axilla. Use as directed. Long-term use can cause thinning of the skin.   gabapentin  (NEURONTIN ) 100 MG capsule Take 1 capsule (100 mg total) by mouth  daily. (Patient taking differently: Take 100 mg by mouth as needed.)   losartan  (COZAAR ) 100 MG tablet Take 1 tablet (100 mg total) by mouth daily.   omeprazole-sodium bicarbonate (ZEGERID) 40-1100 MG capsule Take 1 capsule by mouth daily before breakfast.   spironolactone  (ALDACTONE ) 25 MG tablet Take 1 tablet (25 mg total) by mouth daily.   tirzepatide  (ZEPBOUND ) 2.5 MG/0.5ML Pen Inject 2.5 mg into the skin once a week.   valACYclovir  (VALTREX ) 1000 MG tablet Take 1 tablet (1,000 mg total) by mouth daily.   hydrOXYzine  (ATARAX ) 25 MG tablet Take 1 tablet (25 mg total) by mouth 3 (three) times daily as needed. (Patient not taking: Reported on 10/10/2024)   norethindrone -ethinyl estradiol-FE (JUNEL FE 1/20) 1-20 MG-MCG tablet Take 1 tablet by mouth daily. (Patient not taking: Reported on 10/10/2024)   No facility-administered medications prior to visit.   "

## 2024-10-11 LAB — CBC WITH DIFFERENTIAL/PLATELET
Basophils Absolute: 0.1 x10E3/uL (ref 0.0–0.2)
Basos: 1 %
EOS (ABSOLUTE): 0.2 x10E3/uL (ref 0.0–0.4)
Eos: 4 %
Hematocrit: 42.7 % (ref 34.0–46.6)
Hemoglobin: 14.1 g/dL (ref 11.1–15.9)
Immature Grans (Abs): 0 x10E3/uL (ref 0.0–0.1)
Immature Granulocytes: 0 %
Lymphocytes Absolute: 1.8 x10E3/uL (ref 0.7–3.1)
Lymphs: 34 %
MCH: 29.2 pg (ref 26.6–33.0)
MCHC: 33 g/dL (ref 31.5–35.7)
MCV: 88 fL (ref 79–97)
Monocytes Absolute: 0.4 x10E3/uL (ref 0.1–0.9)
Monocytes: 7 %
Neutrophils Absolute: 2.8 x10E3/uL (ref 1.4–7.0)
Neutrophils: 54 %
Platelets: 333 x10E3/uL (ref 150–450)
RBC: 4.83 x10E6/uL (ref 3.77–5.28)
RDW: 13.5 % (ref 11.7–15.4)
WBC: 5.3 x10E3/uL (ref 3.4–10.8)

## 2024-10-11 LAB — T4, FREE: Free T4: 1.19 ng/dL (ref 0.82–1.77)

## 2024-10-11 LAB — CMP14+EGFR
ALT: 23 IU/L (ref 0–32)
AST: 13 IU/L (ref 0–40)
Albumin: 4.5 g/dL (ref 3.9–4.9)
Alkaline Phosphatase: 74 IU/L (ref 41–116)
BUN/Creatinine Ratio: 21 (ref 9–23)
BUN: 14 mg/dL (ref 6–20)
Bilirubin Total: 0.6 mg/dL (ref 0.0–1.2)
CO2: 23 mmol/L (ref 20–29)
Calcium: 9.7 mg/dL (ref 8.7–10.2)
Chloride: 104 mmol/L (ref 96–106)
Creatinine, Ser: 0.68 mg/dL (ref 0.57–1.00)
Globulin, Total: 2.2 g/dL (ref 1.5–4.5)
Glucose: 83 mg/dL (ref 70–99)
Potassium: 4.2 mmol/L (ref 3.5–5.2)
Sodium: 141 mmol/L (ref 134–144)
Total Protein: 6.7 g/dL (ref 6.0–8.5)
eGFR: 118 mL/min/1.73

## 2024-10-11 LAB — LIPID PANEL
Chol/HDL Ratio: 2.9 ratio (ref 0.0–4.4)
Cholesterol, Total: 188 mg/dL (ref 100–199)
HDL: 65 mg/dL
LDL Chol Calc (NIH): 107 mg/dL — ABNORMAL HIGH (ref 0–99)
Triglycerides: 91 mg/dL (ref 0–149)
VLDL Cholesterol Cal: 16 mg/dL (ref 5–40)

## 2024-10-11 LAB — VITAMIN D 25 HYDROXY (VIT D DEFICIENCY, FRACTURES): Vit D, 25-Hydroxy: 24.6 ng/mL — ABNORMAL LOW (ref 30.0–100.0)

## 2024-10-11 LAB — TSH: TSH: 1.82 u[IU]/mL (ref 0.450–4.500)

## 2024-10-11 LAB — HEMOGLOBIN A1C
Est. average glucose Bld gHb Est-mCnc: 88 mg/dL
Hgb A1c MFr Bld: 4.7 % — ABNORMAL LOW (ref 4.8–5.6)

## 2024-10-11 LAB — VITAMIN B12: Vitamin B-12: 505 pg/mL (ref 232–1245)

## 2024-10-11 LAB — TESTOSTERONE,FREE AND TOTAL
Testosterone, Free: 1.6 pg/mL (ref 0.0–4.2)
Testosterone: 19 ng/dL (ref 8–60)

## 2024-10-11 LAB — T3, FREE: T3, Free: 3.1 pg/mL (ref 2.0–4.4)

## 2024-10-13 ENCOUNTER — Encounter: Payer: Self-pay | Admitting: Orthopedic Surgery

## 2024-10-13 ENCOUNTER — Ambulatory Visit: Payer: Self-pay | Admitting: Cardiology

## 2024-10-13 ENCOUNTER — Other Ambulatory Visit: Payer: Self-pay | Admitting: Orthopedic Surgery

## 2024-10-13 NOTE — Progress Notes (Signed)
 Pt. Notified had stopped taking OTC vitamin d  supplement will start taking it again.

## 2024-10-16 ENCOUNTER — Other Ambulatory Visit: Payer: Self-pay

## 2024-10-16 ENCOUNTER — Encounter: Payer: Self-pay | Admitting: Orthopedic Surgery

## 2024-10-16 ENCOUNTER — Ambulatory Visit
Admission: RE | Admit: 2024-10-16 | Discharge: 2024-10-16 | Disposition: A | Discharge status: Home / Self Care | Attending: Orthopedic Surgery | Admitting: Orthopedic Surgery

## 2024-10-16 ENCOUNTER — Encounter: Admission: RE | Disposition: A | Payer: Self-pay | Source: Home / Self Care | Attending: Orthopedic Surgery

## 2024-10-16 ENCOUNTER — Ambulatory Visit: Payer: Self-pay | Admitting: General Practice

## 2024-10-16 DIAGNOSIS — E119 Type 2 diabetes mellitus without complications: Secondary | ICD-10-CM | POA: Diagnosis not present

## 2024-10-16 DIAGNOSIS — F418 Other specified anxiety disorders: Secondary | ICD-10-CM | POA: Insufficient documentation

## 2024-10-16 DIAGNOSIS — M24411 Recurrent dislocation, right shoulder: Secondary | ICD-10-CM | POA: Diagnosis present

## 2024-10-16 DIAGNOSIS — S42291A Other displaced fracture of upper end of right humerus, initial encounter for closed fracture: Secondary | ICD-10-CM | POA: Diagnosis not present

## 2024-10-16 DIAGNOSIS — I10 Essential (primary) hypertension: Secondary | ICD-10-CM | POA: Insufficient documentation

## 2024-10-16 DIAGNOSIS — X58XXXA Exposure to other specified factors, initial encounter: Secondary | ICD-10-CM | POA: Insufficient documentation

## 2024-10-16 HISTORY — PX: SHOULDER ARTHROSCOPY WITH LABRAL REPAIR: SHX5691

## 2024-10-16 LAB — POCT PREGNANCY, URINE: Preg Test, Ur: NEGATIVE

## 2024-10-16 SURGERY — ARTHROSCOPY, SHOULDER, WITH GLENOID LABRUM REPAIR
Anesthesia: General | Site: Shoulder | Laterality: Right

## 2024-10-16 MED ORDER — EPHEDRINE 5 MG/ML INJ
INTRAVENOUS | Status: AC
  Filled 2024-10-16: qty 5

## 2024-10-16 MED ORDER — CEFAZOLIN SODIUM-DEXTROSE 2-3 GM-%(50ML) IV SOLR
INTRAVENOUS | Status: AC
  Filled 2024-10-16: qty 50

## 2024-10-16 MED ORDER — ASPIRIN 325 MG PO TBEC: 325.0000 mg | DELAYED_RELEASE_TABLET | Freq: Every day | ORAL | 0 refills | Status: AC

## 2024-10-16 MED ORDER — MIDAZOLAM HCL (PF) 2 MG/2ML IJ SOLN
2.0000 mg | INTRAMUSCULAR | Status: DC | PRN
  Administered 2024-10-16: 2 mg via INTRAVENOUS

## 2024-10-16 MED ORDER — OXYCODONE HCL 5 MG/5ML PO SOLN: 5.0000 mg | Freq: Once | ORAL | Status: DC | PRN

## 2024-10-16 MED ORDER — DEXAMETHASONE SODIUM PHOSPHATE 4 MG/ML IJ SOLN
INTRAMUSCULAR | Status: AC
  Filled 2024-10-16: qty 1

## 2024-10-16 MED ORDER — DEXMEDETOMIDINE HCL IN NACL 80 MCG/20ML IV SOLN
INTRAVENOUS | Status: AC
  Filled 2024-10-16: qty 20

## 2024-10-16 MED ORDER — FENTANYL CITRATE (PF) 50 MCG/ML IJ SOSY
50.0000 ug | PREFILLED_SYRINGE | INTRAMUSCULAR | Status: DC | PRN
  Administered 2024-10-16: 50 ug via INTRAVENOUS

## 2024-10-16 MED ORDER — MIDAZOLAM HCL (PF) 2 MG/2ML IJ SOLN: 1.0000 mg | INTRAMUSCULAR | Status: DC | PRN

## 2024-10-16 MED ORDER — BUPIVACAINE HCL (PF) 0.5 % IJ SOLN
INTRAMUSCULAR | Status: DC | PRN
  Administered 2024-10-16: 10 mL

## 2024-10-16 MED ORDER — LACTATED RINGERS IR SOLN
Status: DC | PRN
  Administered 2024-10-16 (×2): 12000 mL

## 2024-10-16 MED ORDER — ACETAMINOPHEN 10 MG/ML IV SOLN
INTRAVENOUS | Status: AC
  Filled 2024-10-16: qty 100

## 2024-10-16 MED ORDER — ONDANSETRON HCL 4 MG/2ML IJ SOLN
INTRAMUSCULAR | Status: AC
  Filled 2024-10-16: qty 2

## 2024-10-16 MED ORDER — MIDAZOLAM HCL 2 MG/2ML IJ SOLN
INTRAMUSCULAR | Status: AC
  Filled 2024-10-16: qty 2

## 2024-10-16 MED ORDER — OXYCODONE HCL 5 MG PO TABS: 5.0000 mg | ORAL_TABLET | Freq: Once | ORAL | Status: DC | PRN

## 2024-10-16 MED ORDER — LACTATED RINGERS IV SOLN
INTRAVENOUS | Status: DC | PRN
  Administered 2024-10-16: 4 mL

## 2024-10-16 MED ORDER — FENTANYL CITRATE (PF) 100 MCG/2ML IJ SOLN
INTRAMUSCULAR | Status: DC | PRN
  Administered 2024-10-16 (×2): 50 ug via INTRAVENOUS

## 2024-10-16 MED ORDER — DROPERIDOL 2.5 MG/ML IJ SOLN
0.6250 mg | Freq: Once | INTRAMUSCULAR | Status: AC
  Administered 2024-10-16: 0.625 mg via INTRAVENOUS

## 2024-10-16 MED ORDER — DROPERIDOL 2.5 MG/ML IJ SOLN
INTRAMUSCULAR | Status: AC
  Filled 2024-10-16: qty 2

## 2024-10-16 MED ORDER — DEXMEDETOMIDINE HCL IN NACL 80 MCG/20ML IV SOLN
INTRAVENOUS | Status: DC | PRN
  Administered 2024-10-16: 6 ug via INTRAVENOUS

## 2024-10-16 MED ORDER — PROPOFOL 10 MG/ML IV BOLUS
INTRAVENOUS | Status: DC | PRN
  Administered 2024-10-16: 200 mg via INTRAVENOUS

## 2024-10-16 MED ORDER — ONDANSETRON 4 MG PO TBDP: 4.0000 mg | ORAL_TABLET | Freq: Three times a day (TID) | ORAL | 0 refills | Status: AC | PRN

## 2024-10-16 MED ORDER — CEFAZOLIN SODIUM-DEXTROSE 2-4 GM/100ML-% IV SOLN
2.0000 g | INTRAVENOUS | Status: AC
  Administered 2024-10-16: 2 g via INTRAVENOUS

## 2024-10-16 MED ORDER — ACETAMINOPHEN 500 MG PO TABS: 1000.0000 mg | ORAL_TABLET | Freq: Three times a day (TID) | ORAL | 2 refills | Status: AC

## 2024-10-16 MED ORDER — FENTANYL CITRATE (PF) 100 MCG/2ML IJ SOLN
INTRAMUSCULAR | Status: AC
  Filled 2024-10-16: qty 2

## 2024-10-16 MED ORDER — ACETAMINOPHEN 10 MG/ML IV SOLN
INTRAVENOUS | Status: DC | PRN
  Administered 2024-10-16: 1000 mg via INTRAVENOUS

## 2024-10-16 MED ORDER — BUPIVACAINE LIPOSOME 1.3 % IJ SUSP
INTRAMUSCULAR | Status: AC
  Filled 2024-10-16: qty 20

## 2024-10-16 MED ORDER — LIDOCAINE HCL (PF) 2 % IJ SOLN
INTRAMUSCULAR | Status: AC
  Filled 2024-10-16: qty 5

## 2024-10-16 MED ORDER — SUCCINYLCHOLINE CHLORIDE 200 MG/10ML IV SOSY
PREFILLED_SYRINGE | INTRAVENOUS | Status: DC | PRN
  Administered 2024-10-16: 80 mg via INTRAVENOUS

## 2024-10-16 MED ORDER — BUPIVACAINE LIPOSOME 1.3 % IJ SUSP
INTRAMUSCULAR | Status: DC | PRN
  Administered 2024-10-16: 20 mL

## 2024-10-16 MED ORDER — SODIUM CHLORIDE 0.9 % IV SOLN: INTRAVENOUS | Status: DC | PRN

## 2024-10-16 MED ORDER — FENTANYL CITRATE (PF) 50 MCG/ML IJ SOSY: 25.0000 ug | PREFILLED_SYRINGE | INTRAMUSCULAR | Status: DC | PRN

## 2024-10-16 MED ORDER — PROPOFOL 10 MG/ML IV BOLUS
INTRAVENOUS | Status: AC
  Filled 2024-10-16: qty 20

## 2024-10-16 MED ORDER — OXYCODONE HCL 5 MG PO TABS: 5.0000 mg | ORAL_TABLET | ORAL | 0 refills | Status: AC | PRN

## 2024-10-16 MED ORDER — BUPIVACAINE HCL (PF) 0.5 % IJ SOLN
INTRAMUSCULAR | Status: AC
  Filled 2024-10-16: qty 10

## 2024-10-16 MED ORDER — DEXAMETHASONE SODIUM PHOSPHATE 4 MG/ML IJ SOLN
INTRAMUSCULAR | Status: DC | PRN
  Administered 2024-10-16: 4 mg via INTRAVENOUS

## 2024-10-16 MED ORDER — EPHEDRINE SULFATE (PRESSORS) 25 MG/5ML IV SOSY
PREFILLED_SYRINGE | INTRAVENOUS | Status: DC | PRN
  Administered 2024-10-16: 10 mg via INTRAVENOUS

## 2024-10-16 MED ORDER — ONDANSETRON HCL 4 MG/2ML IJ SOLN
INTRAMUSCULAR | Status: DC | PRN
  Administered 2024-10-16: 4 mg via INTRAVENOUS

## 2024-10-16 SURGICAL SUPPLY — 40 items
ANCHOR KTLS 1.8 MINI CO-B BLUE (Anchor) IMPLANT
ANCHOR QFIX KTLS 1.8 MINI BLUE (Anchor) IMPLANT
ANCHOR SUT BIOCOMP LK 2.9X12.5 (Anchor) IMPLANT
BIT DRILL FLX 1.8 FX SUT ANCH (DRILL) IMPLANT
BLADE SHAVER 4.5X7 STR FR (MISCELLANEOUS) ×1 IMPLANT
CANNULA PART THRD DISP 5.75X7 (CANNULA) IMPLANT
CANNULA TWIST IN 8.25X7CM (CANNULA) IMPLANT
CHLORAPREP W/TINT 26 (MISCELLANEOUS) ×1 IMPLANT
COOLER ICEMAN CLASSIC (MISCELLANEOUS) ×1 IMPLANT
COVER LIGHT HANDLE 1/PK (MISCELLANEOUS) ×3 IMPLANT
DRAPE IMP U-DRAPE 54X76 (DRAPES) ×1 IMPLANT
DRAPE STERI 35X30 U-POUCH (DRAPES) ×1 IMPLANT
DRAPE U-SHAPE 48X52 POLY STRL (PACKS) ×2 IMPLANT
DRSG TEGADERM 4X4.75 (GAUZE/BANDAGES/DRESSINGS) IMPLANT
ELECTRODE REM PT RTRN 9FT ADLT (ELECTROSURGICAL) IMPLANT
GAUZE SPONGE 4X4 12PLY STRL (GAUZE/BANDAGES/DRESSINGS) ×1 IMPLANT
GAUZE XEROFORM 1X8 LF (GAUZE/BANDAGES/DRESSINGS) ×1 IMPLANT
GLOVE SURG ORTHO LTX SZ8 (GLOVE) ×1 IMPLANT
GLOVE SURG SS PI 7.5 STRL IVOR (GLOVE) IMPLANT
GOWN STRL REIN 2XL XLG LVL4 (GOWN DISPOSABLE) ×1 IMPLANT
GOWN STRL REUS W/ TWL LRG LVL3 (GOWN DISPOSABLE) ×1 IMPLANT
IV LR IRRIG 3000ML ARTHROMATIC (IV SOLUTION) ×6 IMPLANT
KIT DISP PUSHLOCK 2.9 SHRT (KITS) IMPLANT
KIT STABILIZATION SHOULDER (MISCELLANEOUS) ×1 IMPLANT
KIT SUTURE 1.8 Q-FIX DISP (KITS) IMPLANT
KIT TURNOVER KIT A (KITS) ×1 IMPLANT
LASSO 25 DEG RIGHT QUICKPASS (SUTURE) IMPLANT
MANIFOLD NEPTUNE II (INSTRUMENTS) ×1 IMPLANT
MASK FACE SPIDER DISP (MASK) ×1 IMPLANT
MAT ABSORB FLUID 56X50 GRAY (MISCELLANEOUS) ×2 IMPLANT
PACK ARTHROSCOPY SHOULDER (MISCELLANEOUS) ×1 IMPLANT
PAD ABD DERMACEA PRESS 5X9 (GAUZE/BANDAGES/DRESSINGS) IMPLANT
PAD COLD SHLDR SM WRAP-ON (PAD) IMPLANT
SET Y ADAPTER MULIT-BAG IRRIG (MISCELLANEOUS) ×2 IMPLANT
SPONGE T-LAP 18X18 ~~LOC~~+RFID (SPONGE) ×1 IMPLANT
SUTURE EHLN 3-0 FS-10 30 BLK (SUTURE) IMPLANT
TUBE SET DOUBLEFLO INFLOW (TUBING) ×1 IMPLANT
TUBING OUTFLOW SET DBLFO PUMP (TUBING) ×1 IMPLANT
TUBING SUCTION CONN 0.25 STRL (TUBING) IMPLANT
WAND WEREWOLF FLOW 90D (MISCELLANEOUS) ×1 IMPLANT

## 2024-10-16 NOTE — Transfer of Care (Signed)
 Immediate Anesthesia Transfer of Care Note  Patient: Sonya Bauer  Procedure(s) Performed: ARTHROSCOPY, SHOULDER, WITH GLENOID LABRUM REPAIR (Right: Shoulder)  Patient Location: PACU  Anesthesia Type: General ETT  Level of Consciousness: awake, alert  and patient cooperative  Airway and Oxygen Therapy: Patient Spontanous Breathing and Patient connected to supplemental oxygen  Post-op Assessment: Post-op Vital signs reviewed, Patient's Cardiovascular Status Stable, Respiratory Function Stable, Patent Airway and No signs of Nausea or vomiting  Post-op Vital Signs: Reviewed and stable  Complications: No notable events documented.

## 2024-10-16 NOTE — Anesthesia Postprocedure Evaluation (Signed)
"   Anesthesia Post Note  Patient: Sonya Bauer  Procedure(s) Performed: ARTHROSCOPY, SHOULDER, WITH GLENOID LABRUM REPAIR (Right: Shoulder)  Patient location during evaluation: PACU Anesthesia Type: General Level of consciousness: awake and alert Pain management: pain level controlled Vital Signs Assessment: post-procedure vital signs reviewed and stable Respiratory status: spontaneous breathing, nonlabored ventilation, respiratory function stable and patient connected to nasal cannula oxygen Cardiovascular status: blood pressure returned to baseline and stable Postop Assessment: no apparent nausea or vomiting Anesthetic complications: no   No notable events documented.   Last Vitals:  Vitals:   10/16/24 1500 10/16/24 1515  BP: 106/68 107/65  Pulse: 87 87  Resp: (!) 23 (!) 24  Temp:    SpO2: 100% 97%    Last Pain:  Vitals:   10/16/24 1500  TempSrc:   PainSc: 0-No pain                 [facility]      "

## 2024-10-16 NOTE — Progress Notes (Signed)
 Assisted [facility] with interscalene  block. Side rails up, monitors on throughout procedure. See vital signs in flow sheet. Tolerated Procedure well.

## 2024-10-16 NOTE — Discharge Instructions (Signed)
 Post-Op Instructions  1. Bracing: You will wear a shoulder immobilizer or sling for 4 weeks.  2. Driving: No driving for 4 weeks post-op.   3. Activity: No active lifting for 2 months. Wrist, hand, and elbow motion only. Avoid lifting the upper arm away from the body except for hygiene. You are permitted to bend and straighten the elbow actively. You may use your hand and wrist for typing, writing, and managing utensils (cutting food). Do not lift more than a coffee cup for 8 weeks.  When sleeping or resting, inclined positions (recliner chair or wedge pillow) and a pillow under the forearm for support may provide better comfort for up to 4 weeks.  Avoid long distance travel for 4 weeks.  Return to normal activities after labral repair normally takes 6 months on average. If rehab goes very well, you may be able to do most activities at 4 months, except overhead or contact sports.  4. Physical Therapy: Begins 3-7 days after surgery, and proceeds 2 times per week for the first 4 weeks, then 1-2 times per week from weeks 4-8 post-op.  5. Medications:  - You will be provided a prescription for narcotic pain medicine. After surgery, take 1-2 narcotic tablets every 4 hours if needed for severe pain.  - A prescription for anti-nausea medication will be provided in case the narcotic medicine causes nausea - take 1 tablet every 6 hours only if nauseated.   - Take tylenol  1000 mg every 8 hours for pain.  May stop tylenol  3 days after surgery if you are having minimal pain.  If you are taking prescription medication for anxiety, depression, insomnia, muscle spasm, chronic pain, or for attention deficit disorder, you are advised that you are at a higher risk of adverse effects with use of narcotics post-op, including narcotic addiction/dependence, depressed breathing, death. If you use non-prescribed substances: alcohol, marijuana, cocaine, heroin, methamphetamines, etc., you are at a higher risk of adverse  effects with use of narcotics post-op, including narcotic addiction/dependence, depressed breathing, death. You are advised that taking > 50 morphine  milligram equivalents (MME) of narcotic pain medication per day results in twice the risk of overdose or death. For your prescription provided: oxycodone  5 mg - taking more than 6 tablets per day would result in > 50 morphine  milligram equivalents (MME) of narcotic pain medication. Be advised that we will prescribe narcotics short-term, for acute post-operative pain only - 3 weeks for major operations such as shoulder repair/reconstruction surgeries.   6. Post-Op Appointment:  Your first post-op appointment will be 10-14 days post-op.  7. Work or School: For most, but not all procedures, we advise staying out of work or school for at least 1 to 2 weeks in order to recover from the stress of surgery and to allow time for healing.   If you need a work or school note this can be provided.   Post-operative Brace: Apply and remove the brace you received as you were instructed to at the time of fitting and as described in detail as the braces instructions for use indicate.  Wear the brace for the period of time prescribed by your physician.  The brace can be cleaned with soap and water and allowed to air dry only.  Should the brace result in increased pain, decreased feeling (numbness/tingling), increased swelling or an overall worsening of your medical condition, please contact your doctor immediately.  If an emergency situation occurs as a result of wearing the brace after normal business  hours, please dial 911 and seek immediate medical attention.  Let your doctor know if you have any further questions about the brace issued to you. Refer to the shoulder sling instructions for use if you have any questions regarding the correct fit of your shoulder sling.  [facility] Customer Care for Troubleshooting: [phone]  Video that illustrates how to properly use a  shoulder sling: Instructions for Proper Use of an Orthopaedic Sling [email or web address].com/

## 2024-10-16 NOTE — Anesthesia Procedure Notes (Signed)
 Procedure Name: Intubation Date/Time: 10/16/2024 12:40 PM  Performed by: Myra Lawless, CRNAPre-anesthesia Checklist: Patient identified, Patient being monitored, Timeout performed, Emergency Drugs available and Suction available Patient Re-evaluated:Patient Re-evaluated prior to induction Oxygen Delivery Method: Circle system utilized Preoxygenation: Pre-oxygenation with 100% oxygen Induction Type: IV induction Ventilation: Mask ventilation without difficulty Laryngoscope Size: Mac and 3 Grade View: Grade I Tube type: Oral Tube size: 7.0 mm Number of attempts: 1 Airway Equipment and Method: Stylet Placement Confirmation: ETT inserted through vocal cords under direct vision, positive ETCO2 and breath sounds checked- equal and bilateral Secured at: 21 cm Tube secured with: Tape Dental Injury: Teeth and Oropharynx as per pre-operative assessment

## 2024-10-16 NOTE — Anesthesia Procedure Notes (Signed)
 Anesthesia Regional Block: Interscalene brachial plexus block   Pre-Anesthetic Checklist: , timeout performed,  Correct Patient, Correct Site, Correct Laterality,  Correct Procedure, Correct Position, site marked,  Risks and benefits discussed,  Surgical consent,  Pre-op evaluation,  At surgeon's request and post-op pain management  Laterality: Right  Prep: chloraprep       Needles:  Injection technique: Single-shot  Needle Type: Echogenic Needle     Needle Length: 4cm  Needle Gauge: 25     Additional Needles:   Procedures:,,,, ultrasound used (permanent image in chart),,    Narrative:  Start time: 10/16/2024 11:53 AM End time: 10/16/2024 11:58 AM Injection made incrementally with aspirations every 5 mL.  Performed by: Personally  Anesthesiologist: Leavy Ned, MD  Additional Notes: Patient's chart reviewed and they were deemed appropriate candidate for procedure, at surgeon's request. Patient educated about risks, benefits, and alternatives of the block including but not limited to: temporary or permanent nerve damage, bleeding, infection, damage to surround tissues, pneumothorax, hemidiaphragmatic paralysis, unilateral Horner's syndrome, block failure, local anesthetic toxicity. Patient expressed understanding. A formal time-out was conducted consistent with institution rules.  Monitors were applied, and minimal sedation used (see nursing record). The site was prepped with skin prep and allowed to dry, and sterile gloves were used. A high frequency linear ultrasound probe with probe cover was utilized throughout. C5-7 nerve roots located and appeared anatomically normal, local anesthetic injected around them, and echogenic block needle trajectory was monitored throughout. Aspiration performed every 5ml. Lung and blood vessels were avoided. All injections were performed without resistance and free of blood and paresthesias. The patient tolerated the procedure well.  Injectate:  20ml exparel  + 10ml 0.5% bupivacaine 

## 2024-10-16 NOTE — Anesthesia Preprocedure Evaluation (Signed)
 "                                  Anesthesia Evaluation  Patient identified by MRN, date of birth, ID band Patient awake    Reviewed: Allergy & Precautions, NPO status , Patient's Chart, lab work & pertinent test results  Airway Mallampati: II  TM Distance: >3 FB Neck ROM: full    Dental  (+) Chipped   Pulmonary neg pulmonary ROS   Pulmonary exam normal        Cardiovascular hypertension, On Medications Normal cardiovascular exam     Neuro/Psych  Headaches PSYCHIATRIC DISORDERS Anxiety Depression       GI/Hepatic negative GI ROS, Neg liver ROS,,,  Endo/Other  negative endocrine ROSdiabetes    Renal/GU      Musculoskeletal   Abdominal   Peds  Hematology negative hematology ROS (+)   Anesthesia Other Findings Past Medical History: 2017: Atypical mole     Comment:  back, dysplastic nevus, exc No date: Depression No date: Family history of adverse reaction to anesthesia     Comment:  MOM-HAD SEIZURE DURING SURGERY DURING AN INJECTION, NO               SEIZURES SINCE WITH ANESTHESIA No date: GERD (gastroesophageal reflux disease)     Comment:  EVERY OTHER DAY No date: Gestational diabetes 07/31/2019: Gluten intolerance No date: Headache No date: Heart murmur     Comment:  ASYMPTOMATIC No date: Heartburn No date: Hiatal hernia No date: History of kidney stones No date: Hypertension No date: Kidney stones No date: Motion sickness     Comment:  long trips No date: Pregnancy induced hypertension 11/16/2014: Thyroid  cyst  Past Surgical History: 10/06/2024: BICEPT TENODESIS; Right     Comment:  Procedure: TENODESIS, BICEPS;  Surgeon: Tobie Priest,               MD;  Location: [facility];  Service:               Orthopedics;  Laterality: Right;  Right shoulder               circumferential labral repair with Remplissage procedure,              possible biceps tenodesis 10/06/2016: CYSTOSCOPY WITH STENT PLACEMENT; Left     Comment:   Procedure: CYSTOSCOPY WITH STENT PLACEMENT;  Surgeon:               Ricardo Likens, MD;  Location: [facility];  Service:               Urology;  Laterality: Left; 10/06/2024: SHOULDER ARTHROSCOPY WITH LABRAL REPAIR; Right     Comment:  Procedure: ARTHROSCOPY, SHOULDER, WITH GLENOID LABRUM               REPAIR;  Surgeon: Tobie Priest, MD;  Location: [facility];  Service: Orthopedics;  Laterality: Right;               Right shoulder circumferential labral repair with               Remplissage procedure, possible biceps tenodesis 05/20/2018: TONSILLECTOMY AND ADENOIDECTOMY; N/A     Comment:  Procedure: TONSILLECTOMY;  Surgeon: Herminio Miu,               MD;  Location: [facility];  Service: ENT;                Laterality: N/A; 10/06/2016: URETEROSCOPY WITH HOLMIUM LASER LITHOTRIPSY; Left     Comment:  Procedure: URETEROSCOPY WITH HOLMIUM LASER LITHOTRIPSY;               Surgeon: Ricardo Likens, MD;  Location: [facility];                Service: Urology;  Laterality: Left; No date: WISDOM TOOTH EXTRACTION     Comment:  AGE 34  BMI    Body Mass Index: 26.97 kg/m      Reproductive/Obstetrics negative OB ROS                              Anesthesia Physical Anesthesia Plan  ASA: 2  Anesthesia Plan: General ETT   Post-op Pain Management: Regional block   Induction: Intravenous  PONV Risk Score and Plan: 3 and Ondansetron , Dexamethasone  and Midazolam   Airway Management Planned: Oral ETT  Additional Equipment:   Intra-op Plan:   Post-operative Plan: Extubation in OR  Informed Consent: I have reviewed the patients History and Physical, chart, labs and discussed the procedure including the risks, benefits and alternatives for the proposed anesthesia with the patient or authorized representative who has indicated his/her understanding and acceptance.     Dental Advisory Given  Plan Discussed with: Anesthesiologist, CRNA  and Surgeon  Anesthesia Plan Comments: (Patient consented for risks of anesthesia including but not limited to:  - adverse reactions to medications - damage to eyes, teeth, lips or other oral mucosa - nerve damage due to positioning  - sore throat or hoarseness - Damage to heart, brain, nerves, lungs, other parts of body or loss of life  Patient voiced understanding and assent.)        Anesthesia Quick Evaluation  "

## 2024-10-16 NOTE — H&P (Signed)
 Paper H&P to be scanned into permanent record. H&P reviewed. No significant changes noted.

## 2024-10-16 NOTE — Op Note (Signed)
 "  Operative Note    SURGERY DATE: 10/16/2024   PRE-OP DIAGNOSIS:  1. Right shoulder multiple anterior dislocations  2. Right shoulder large Hill-Sachs lesion   POST-OP DIAGNOSIS:  1. Right shoulder multiple anterior dislocations  2. Right shoulder large Hill-Sachs lesion   PROCEDURES:  1.  Right shoulder arthroscopic anterior labral repair and capsulorraphy 2.  Right shoulder Remplissage procedure  SURGEON: Earnestine HILARIO Blanch, MD   ASSISTANT(S):  DOROTHA Krystal Doyne, PA   ANESTHESIA: Regional + Gen   TOTAL IV FLUIDS: see anesthesia record   ESTIMATED BLOOD LOSS: minimal    DRAINS: None    SPECIMENS: None.    IMPLANTS:  - [employer] Knotless Qfix x 7 - Arthrex 2.69mm PushLock anchor x 1  COMPLICATIONS: None    OPERATIVE FINDINGS:  Examination under anesthesia: A careful examination under anesthesia was performed.  Passive range of motion was: FF: 160; ER at side: 70; ER in abduction: 100; IR in abduction: 50.  Anterior load shift: 2+.  Posterior load shift: 1+.  Sulcus in neutral: 1+.  Sulcus in ER: 1+     Intra-operative findings: A thorough arthroscopic examination of the shoulder was performed.  The findings are: 1. Biceps tendon: normal 2. Superior labrum: normal 3. Posterior labrum and capsule: Normal labrum, mild synovitis about posterior capsule 4. Inferior capsule and inferior recess: patulous capsule, diminutive labrum 5. Glenoid cartilage surface: Normal. ~5% bone loss anteriorly/inferiorly 6. Supraspinatus attachment:  normal 7. Posterior rotator cuff attachment: normal, Hill-Sachs lesion present 8. Humeral head articular cartilage: normal 9. Rotator interval: significant synovitis 10: Subscapularis tendon: attachment intact 11. Anterior labrum: labral detatchment/diminutive labrum from 3 o'clock position extending posteriorly to 7 o'clock position; patulous anterior capsule 12. IGHL: stretched   OPERATIVE REPORT:    Indications for procedure: Sonya Bauer  is a 34 y.o. female with chronic shoulder instability. The patient has had at least 5 prior dislocation episodes. Last dislocation occurred when she was just reaching into the backseat of her car.  MRI and CT scan showed detached anteroinferior labral tear with a patulous capsule as well as a very large Hill-Sachs lesion. The patient had minimal glenoid bone loss on advanced imaging. Surgery was recommended for anterior labral repair and capsulorraphy with remplissage to reduce the risk of recurrent dislocation.      Procedure in detail:   I identified Sonya Bauer in the pre-operative holding area. The risks, benefits, complications, treatment options, and expected outcomes were discussed with the patient. The risks and potential complications include, but are not limited to failure to fully relieve pain, infection, neurovascular compromise, complications from anesthesia, stiff shoulder, recurrent dislocation, and failure of surgery with continued pain. The patient concurred with the proposed plan, giving informed consent. I marked the operative shoulder with my initials.  Anesthesia was then performed with an interscalene block with Exparel .  The patient was transferred to the operative suite and placed in the beach chair position.     Appropriate IV antibiotics were administered prior to incision. The operative upper extremity was then prepped and draped in standard fashion. A time out was performed confirming the correct extremity, correct patient, and correct procedure.    I then created a standard posterior portal with an 11 blade. The glenohumeral joint was easily entered with a blunt trochar and the arthroscope introduced. A high anterior and lateral portal was made. The findings of diagnostic arthroscopy are described above. Next, an anterior inferior portal was made just lateral  to the coracoid entering just above the subscapularis.  A threaded cannula was placed. I first debrided and  then coagulated the inflamed synovium about the rotator interval to obtain hemostasis and reduce the risk of post-operative swelling using an Arthrocare radiofrequency device.  Camera was then transitioned to the anterolateral portal.  There was significant synovitis about the posterior capsule as well.  This was debrided and coagulated.   An ArthroCare wand was used to remove any soft tissue from the Hill-Sachs defect.  The defect was then prepared with a curette.  A separate far lateral posterior portal was made under needle localization.  A threaded  cannula was placed deep to the deltoid but superficial to the infraspinatus and posterior capsule.  Using the guide to pierce the infraspinatus and posterior capsule, a Qfix anchor was passed at the inferior portion of the Hill-Sachs defect.  Next, going through the same cannula, through a more superior portion of the infraspinatus/capsule, a second Qfix anchor was passed at the superior portion of the Hill-Sachs defect.  Both anchors were placed at the medial-most aspect of the defect adjacent to the articular surface.  The knotless mechanism from the inferior anchor was utilized to shuttle the superior repair suture and vice versa.  The sutures were left partially tensioned for full tensioning at the end of the surgery.  Next, a portal [place] was made to better access the posterior and inferior labrum.   An elevator was used to elevate the labrum off the glenoid. A rasp was used to roughen the glenoid for improvement of healing. A shaver was also used to debride degenerative labrum and further clean the glenoid face to allow for improved healing. The curved drill guide for the knotless Qfix was placed at the 6 o'clock position.  This was drilled, and anchor was placed.  The Arthrex SutureLasso SD (25 degree to the right) was then used to pass the nitinol wire loop through the capsule and under the labrum. Repair suture was shuttled through the nitinol wire  and then through the anchor using the passing suture.  This was partially tensioned and brought out of the posterior portal.  This process was repeated for an anchor at the 7 o'clock position.  However, the repair suture did not shuttled through the anchor and decision was made to use the repair suture, but it was anchored to the glenoid at the 7 o'clock position using a push lock anchor at the end of the case.  Camera was transitioned to the posterior portal.  Further anchors were placed at the 5:00, 4:00, and 3:00 positions.  Sutures were then fully tensioned starting posteriorly, then inferiorly, and then anteriorly.  All sutures were then cut.  This created an excellent anterior/inferior bumper, capsulorrhaphy, and labral repair.  The humeral head was also noted to be more centered.   The arthroscope was placed back into the anterolateral portal. Lastly, the remplissage sutures were tightened sequentially while viewing the Hill-Sachs defect.  With final tightening, the infraspinatus tendon filled the entirety of the Hill-Sachs defect.  Suture tails were cut and tendon was firmly down to bone on probing.   Fluid was evacuated from the shoulder, and the portals were closed with 3-0 Nylon. Xeroform was applied to the portals. A sterile dressing was applied, followed by a Polar Care sleeve and a SlingShot shoulder immobilizer/sling. The patient awoke from anesthesia without difficulty and was transferred to the PACU in stable condition.    Of note, assistance from a PA  was essential to performing the surgery.  PA was present for the entire surgery.  PA assisted with patient positioning, retraction, instrumentation, and wound closure. The surgery would have been more difficult and had longer operative time without PA assistance.      COMPLICATIONS: none   DISPOSITION: plan for discharge home after recovery in PACU   POSTOPERATIVE PLAN: Remain in sling (except hygiene and elbow/wrist/hand RoM exercises as  instructed by PT) x 4 weeks and NWB for this time. PT to begin 3-4 days after surgery. Anterior shoulder stabilization/capsulorraphy protocol.     "

## 2024-10-25 ENCOUNTER — Encounter: Payer: Self-pay | Admitting: Orthopedic Surgery

## 2025-02-08 ENCOUNTER — Ambulatory Visit: Admitting: Cardiology

## 2025-03-06 ENCOUNTER — Encounter: Admitting: Dermatology
